# Patient Record
Sex: Female | Born: 1940 | Race: White | Hispanic: No | Marital: Married | Smoking: Former smoker
Health system: Southern US, Community
[De-identification: ages and names within clinical notes are randomized; demographics above are authoritative.]

## PROBLEM LIST (undated history)

## (undated) DIAGNOSIS — R159 Full incontinence of feces: Secondary | ICD-10-CM

## (undated) DIAGNOSIS — F32A Depression, unspecified: Secondary | ICD-10-CM

## (undated) DIAGNOSIS — E039 Hypothyroidism, unspecified: Secondary | ICD-10-CM

## (undated) DIAGNOSIS — M1990 Unspecified osteoarthritis, unspecified site: Secondary | ICD-10-CM

## (undated) DIAGNOSIS — M5416 Radiculopathy, lumbar region: Secondary | ICD-10-CM

## (undated) DIAGNOSIS — R269 Unspecified abnormalities of gait and mobility: Secondary | ICD-10-CM

## (undated) DIAGNOSIS — M792 Neuralgia and neuritis, unspecified: Secondary | ICD-10-CM

## (undated) DIAGNOSIS — Z860101 Personal history of adenomatous and serrated colon polyps: Secondary | ICD-10-CM

## (undated) DIAGNOSIS — N189 Chronic kidney disease, unspecified: Secondary | ICD-10-CM

## (undated) DIAGNOSIS — G43909 Migraine, unspecified, not intractable, without status migrainosus: Secondary | ICD-10-CM

## (undated) DIAGNOSIS — Z8719 Personal history of other diseases of the digestive system: Secondary | ICD-10-CM

## (undated) DIAGNOSIS — F419 Anxiety disorder, unspecified: Secondary | ICD-10-CM

## (undated) DIAGNOSIS — F329 Major depressive disorder, single episode, unspecified: Secondary | ICD-10-CM

## (undated) DIAGNOSIS — K219 Gastro-esophageal reflux disease without esophagitis: Secondary | ICD-10-CM

## (undated) DIAGNOSIS — K5790 Diverticulosis of intestine, part unspecified, without perforation or abscess without bleeding: Secondary | ICD-10-CM

## (undated) DIAGNOSIS — E7800 Pure hypercholesterolemia, unspecified: Secondary | ICD-10-CM

## (undated) DIAGNOSIS — E669 Obesity, unspecified: Secondary | ICD-10-CM

## (undated) DIAGNOSIS — Z87898 Personal history of other specified conditions: Secondary | ICD-10-CM

## (undated) DIAGNOSIS — M797 Fibromyalgia: Secondary | ICD-10-CM

## (undated) DIAGNOSIS — G3184 Mild cognitive impairment, so stated: Secondary | ICD-10-CM

## (undated) DIAGNOSIS — Z8669 Personal history of other diseases of the nervous system and sense organs: Secondary | ICD-10-CM

## (undated) DIAGNOSIS — I10 Essential (primary) hypertension: Secondary | ICD-10-CM

## (undated) DIAGNOSIS — M353 Polymyalgia rheumatica: Secondary | ICD-10-CM

## (undated) DIAGNOSIS — I700 Atherosclerosis of aorta: Secondary | ICD-10-CM

## (undated) HISTORY — PX: BREAST CYST ASPIRATION: SHX578

## (undated) HISTORY — PX: CHOLECYSTECTOMY: SHX55

## (undated) HISTORY — PX: JOINT REPLACEMENT: SHX530

## (undated) HISTORY — PX: COLON SURGERY: SHX602

## (undated) HISTORY — PX: HERNIA REPAIR: SHX51

## (undated) HISTORY — PX: THYROIDECTOMY: SHX17

## (undated) HISTORY — PX: ABDOMINAL HYSTERECTOMY: SHX81

---

## 2004-10-31 ENCOUNTER — Ambulatory Visit: Payer: Self-pay

## 2006-05-07 ENCOUNTER — Ambulatory Visit: Payer: Self-pay

## 2006-05-13 ENCOUNTER — Ambulatory Visit: Payer: Self-pay

## 2006-09-06 ENCOUNTER — Inpatient Hospital Stay: Payer: Self-pay | Admitting: Internal Medicine

## 2006-09-08 HISTORY — PX: COLONOSCOPY: SHX174

## 2008-12-19 ENCOUNTER — Inpatient Hospital Stay: Payer: Self-pay | Admitting: Internal Medicine

## 2009-01-24 ENCOUNTER — Ambulatory Visit: Payer: Self-pay | Admitting: Unknown Physician Specialty

## 2009-01-24 HISTORY — PX: ESOPHAGOGASTRODUODENOSCOPY: SHX1529

## 2009-06-13 ENCOUNTER — Inpatient Hospital Stay: Payer: Self-pay | Admitting: Internal Medicine

## 2010-02-06 ENCOUNTER — Inpatient Hospital Stay: Payer: Self-pay | Admitting: Surgery

## 2010-02-12 LAB — PATHOLOGY REPORT

## 2010-06-29 ENCOUNTER — Emergency Department: Payer: Self-pay | Admitting: Emergency Medicine

## 2010-07-05 ENCOUNTER — Inpatient Hospital Stay: Payer: Self-pay | Admitting: Surgery

## 2010-08-09 ENCOUNTER — Emergency Department: Payer: Self-pay | Admitting: Internal Medicine

## 2010-08-10 ENCOUNTER — Emergency Department: Payer: Self-pay | Admitting: Internal Medicine

## 2012-02-27 DIAGNOSIS — Z531 Procedure and treatment not carried out because of patient's decision for reasons of belief and group pressure: Secondary | ICD-10-CM | POA: Insufficient documentation

## 2012-04-15 DIAGNOSIS — M792 Neuralgia and neuritis, unspecified: Secondary | ICD-10-CM

## 2012-04-15 HISTORY — DX: Neuralgia and neuritis, unspecified: M79.2

## 2012-07-20 ENCOUNTER — Ambulatory Visit: Payer: Self-pay | Admitting: Unknown Physician Specialty

## 2012-08-12 ENCOUNTER — Emergency Department: Payer: Self-pay | Admitting: Emergency Medicine

## 2012-08-12 LAB — COMPREHENSIVE METABOLIC PANEL
Alkaline Phosphatase: 87 U/L (ref 50–136)
Anion Gap: 10 (ref 7–16)
BUN: 16 mg/dL (ref 7–18)
Bilirubin,Total: 1.5 mg/dL — ABNORMAL HIGH (ref 0.2–1.0)
Co2: 24 mmol/L (ref 21–32)
Creatinine: 0.89 mg/dL (ref 0.60–1.30)
Glucose: 109 mg/dL — ABNORMAL HIGH (ref 65–99)
Osmolality: 277 (ref 275–301)
Potassium: 3.3 mmol/L — ABNORMAL LOW (ref 3.5–5.1)
SGPT (ALT): 25 U/L (ref 12–78)

## 2012-08-12 LAB — CBC WITH DIFFERENTIAL/PLATELET
Basophil #: 0 10*3/uL (ref 0.0–0.1)
Eosinophil #: 0.1 10*3/uL (ref 0.0–0.7)
Eosinophil %: 1.5 %
Lymphocyte %: 11.2 %
MCH: 33.3 pg (ref 26.0–34.0)
MCHC: 35.8 g/dL (ref 32.0–36.0)
MCV: 93 fL (ref 80–100)
Monocyte #: 0.6 x10 3/mm (ref 0.2–0.9)
Monocyte %: 7.6 %
Neutrophil #: 6.7 10*3/uL — ABNORMAL HIGH (ref 1.4–6.5)
Neutrophil %: 79.4 %
Platelet: 179 10*3/uL (ref 150–440)
RDW: 12.9 % (ref 11.5–14.5)
WBC: 8.4 10*3/uL (ref 3.6–11.0)

## 2012-08-12 LAB — URINALYSIS, COMPLETE
Bilirubin,UR: NEGATIVE
Ketone: NEGATIVE
Leukocyte Esterase: NEGATIVE
Ph: 5 (ref 4.5–8.0)
RBC,UR: NONE SEEN /HPF (ref 0–5)
Squamous Epithelial: 2
WBC UR: <1 /HPF (ref 0–5)

## 2012-09-28 ENCOUNTER — Ambulatory Visit: Payer: Self-pay | Admitting: Unknown Physician Specialty

## 2012-09-30 LAB — PATHOLOGY REPORT

## 2013-01-25 DIAGNOSIS — M792 Neuralgia and neuritis, unspecified: Secondary | ICD-10-CM | POA: Insufficient documentation

## 2013-07-06 ENCOUNTER — Ambulatory Visit: Payer: Self-pay | Admitting: Family Medicine

## 2013-07-16 ENCOUNTER — Ambulatory Visit: Payer: Self-pay | Admitting: Orthopedic Surgery

## 2013-08-11 ENCOUNTER — Ambulatory Visit: Payer: Self-pay | Admitting: Orthopedic Surgery

## 2013-08-11 LAB — CBC
HCT: 42 % (ref 35.0–47.0)
HGB: 14.6 g/dL (ref 12.0–16.0)
MCH: 33.4 pg (ref 26.0–34.0)
MCHC: 34.8 g/dL (ref 32.0–36.0)
MCV: 96 fL (ref 80–100)
RBC: 4.38 10*6/uL (ref 3.80–5.20)
RDW: 12.9 % (ref 11.5–14.5)
WBC: 8.4 10*3/uL (ref 3.6–11.0)

## 2013-08-11 LAB — BASIC METABOLIC PANEL
Anion Gap: 9 (ref 7–16)
BUN: 14 mg/dL (ref 7–18)
CALCIUM: 8.8 mg/dL (ref 8.5–10.1)
CO2: 24 mmol/L (ref 21–32)
Chloride: 108 mmol/L — ABNORMAL HIGH (ref 98–107)
Creatinine: 0.79 mg/dL (ref 0.60–1.30)
EGFR (African American): >60
Glucose: 111 mg/dL — ABNORMAL HIGH (ref 65–99)
OSMOLALITY: 282 (ref 275–301)
Potassium: 3.7 mmol/L (ref 3.5–5.1)
Sodium: 141 mmol/L (ref 136–145)

## 2013-08-11 LAB — URINALYSIS, COMPLETE
BACTERIA: NONE SEEN
Bilirubin,UR: NEGATIVE
GLUCOSE, UR: NEGATIVE mg/dL (ref 0–75)
Ketone: NEGATIVE
Leukocyte Esterase: NEGATIVE
NITRITE: NEGATIVE
PH: 6 (ref 4.5–8.0)
Protein: NEGATIVE
SPECIFIC GRAVITY: 1.017 (ref 1.003–1.030)
WBC UR: <1 /HPF (ref 0–5)

## 2013-08-11 LAB — PROTIME-INR
INR: 1
Prothrombin Time: 13.2 secs (ref 11.5–14.7)

## 2013-08-11 LAB — MRSA PCR SCREENING

## 2013-08-11 LAB — APTT: Activated PTT: 31.9 secs (ref 23.6–35.9)

## 2013-08-25 DIAGNOSIS — F419 Anxiety disorder, unspecified: Secondary | ICD-10-CM | POA: Insufficient documentation

## 2013-10-12 ENCOUNTER — Ambulatory Visit: Payer: Self-pay | Admitting: Orthopedic Surgery

## 2013-10-12 LAB — CBC WITH DIFFERENTIAL/PLATELET
Basophil #: 0 10*3/uL (ref 0.0–0.1)
Basophil %: 0.6 %
Eosinophil #: 0.4 10*3/uL (ref 0.0–0.7)
Eosinophil %: 5.6 %
HCT: 40.8 % (ref 35.0–47.0)
HGB: 14.2 g/dL (ref 12.0–16.0)
Lymphocyte #: 1.7 10*3/uL (ref 1.0–3.6)
Lymphocyte %: 24.4 %
MCH: 33.2 pg (ref 26.0–34.0)
MCHC: 34.8 g/dL (ref 32.0–36.0)
MCV: 95 fL (ref 80–100)
Monocyte #: 0.5 x10 3/mm (ref 0.2–0.9)
Monocyte %: 7.2 %
Neutrophil #: 4.3 10*3/uL (ref 1.4–6.5)
Neutrophil %: 62.2 %
PLATELETS: 174 10*3/uL (ref 150–440)
RBC: 4.28 10*6/uL (ref 3.80–5.20)
RDW: 12.8 % (ref 11.5–14.5)
WBC: 7 10*3/uL (ref 3.6–11.0)

## 2013-10-12 LAB — BASIC METABOLIC PANEL
Anion Gap: 9 (ref 7–16)
BUN: 12 mg/dL (ref 7–18)
CREATININE: 0.71 mg/dL (ref 0.60–1.30)
Calcium, Total: 9 mg/dL (ref 8.5–10.1)
Chloride: 103 mmol/L (ref 98–107)
Co2: 30 mmol/L (ref 21–32)
EGFR (African American): >60
Glucose: 79 mg/dL (ref 65–99)
Osmolality: 282 (ref 275–301)
Potassium: 3.7 mmol/L (ref 3.5–5.1)
SODIUM: 142 mmol/L (ref 136–145)

## 2013-10-12 LAB — URINALYSIS, COMPLETE
Bacteria: NONE SEEN
Bilirubin,UR: NEGATIVE
Blood: NEGATIVE
Glucose,UR: NEGATIVE mg/dL (ref 0–75)
KETONE: NEGATIVE
Leukocyte Esterase: NEGATIVE
NITRITE: NEGATIVE
PROTEIN: NEGATIVE
Ph: 5 (ref 4.5–8.0)
RBC,UR: 1 /HPF (ref 0–5)
SPECIFIC GRAVITY: 1.025 (ref 1.003–1.030)
Squamous Epithelial: 6
WBC UR: 1 /HPF (ref 0–5)

## 2013-10-12 LAB — MRSA PCR SCREENING

## 2013-10-21 ENCOUNTER — Inpatient Hospital Stay: Payer: Self-pay | Admitting: Orthopedic Surgery

## 2013-10-21 LAB — PROTIME-INR
INR: 1
Prothrombin Time: 13.5 secs (ref 11.5–14.7)

## 2013-10-21 LAB — URINALYSIS, COMPLETE
Bacteria: NONE SEEN
Bilirubin,UR: NEGATIVE
Blood: NEGATIVE
Glucose,UR: NEGATIVE mg/dL (ref 0–75)
KETONE: NEGATIVE
Leukocyte Esterase: NEGATIVE
Nitrite: NEGATIVE
PH: 6 (ref 4.5–8.0)
Protein: NEGATIVE
SPECIFIC GRAVITY: 1.02 (ref 1.003–1.030)
Squamous Epithelial: 7
WBC UR: 1 /HPF (ref 0–5)

## 2013-10-21 LAB — APTT: Activated PTT: 34.8 secs (ref 23.6–35.9)

## 2013-10-22 LAB — BASIC METABOLIC PANEL
Anion Gap: 8 (ref 7–16)
BUN: 7 mg/dL (ref 7–18)
CALCIUM: 8.3 mg/dL — AB (ref 8.5–10.1)
CHLORIDE: 100 mmol/L (ref 98–107)
CO2: 26 mmol/L (ref 21–32)
CREATININE: 0.63 mg/dL (ref 0.60–1.30)
EGFR (African American): >60
EGFR (Non-African Amer.): >60
Glucose: 144 mg/dL — ABNORMAL HIGH (ref 65–99)
Osmolality: 269 (ref 275–301)
Potassium: 4 mmol/L (ref 3.5–5.1)
SODIUM: 134 mmol/L — AB (ref 136–145)

## 2013-10-22 LAB — CBC WITH DIFFERENTIAL/PLATELET
Basophil #: 0 10*3/uL (ref 0.0–0.1)
Basophil %: 0.3 %
EOS ABS: 0.1 10*3/uL (ref 0.0–0.7)
Eosinophil %: 0.9 %
HCT: 37.6 % (ref 35.0–47.0)
HGB: 13.3 g/dL (ref 12.0–16.0)
LYMPHS ABS: 1.1 10*3/uL (ref 1.0–3.6)
LYMPHS PCT: 10.5 %
MCH: 34.3 pg — AB (ref 26.0–34.0)
MCHC: 35.4 g/dL (ref 32.0–36.0)
MCV: 97 fL (ref 80–100)
Monocyte #: 1.1 x10 3/mm — ABNORMAL HIGH (ref 0.2–0.9)
Monocyte %: 10.8 %
Neutrophil #: 8 10*3/uL — ABNORMAL HIGH (ref 1.4–6.5)
Neutrophil %: 77.5 %
PLATELETS: 187 10*3/uL (ref 150–440)
RBC: 3.88 10*6/uL (ref 3.80–5.20)
RDW: 12.8 % (ref 11.5–14.5)
WBC: 10.4 10*3/uL (ref 3.6–11.0)

## 2013-10-23 LAB — HEMOGLOBIN: HGB: 11.7 g/dL — ABNORMAL LOW (ref 12.0–16.0)

## 2013-10-24 LAB — CBC WITH DIFFERENTIAL/PLATELET
Basophil #: 0.1 10*3/uL (ref 0.0–0.1)
Basophil %: 0.7 %
EOS ABS: 0.5 10*3/uL (ref 0.0–0.7)
EOS PCT: 5.4 %
HCT: 30.3 % — AB (ref 35.0–47.0)
HGB: 10.6 g/dL — ABNORMAL LOW (ref 12.0–16.0)
LYMPHS ABS: 1.5 10*3/uL (ref 1.0–3.6)
Lymphocyte %: 16.1 %
MCH: 33.5 pg (ref 26.0–34.0)
MCHC: 34.9 g/dL (ref 32.0–36.0)
MCV: 96 fL (ref 80–100)
MONO ABS: 0.8 x10 3/mm (ref 0.2–0.9)
MONOS PCT: 8.2 %
Neutrophil #: 6.4 10*3/uL (ref 1.4–6.5)
Neutrophil %: 69.6 %
Platelet: 163 10*3/uL (ref 150–440)
RBC: 3.16 10*6/uL — AB (ref 3.80–5.20)
RDW: 12.6 % (ref 11.5–14.5)
WBC: 9.2 10*3/uL (ref 3.6–11.0)

## 2013-10-24 LAB — BASIC METABOLIC PANEL
ANION GAP: 6 — AB (ref 7–16)
BUN: 8 mg/dL (ref 7–18)
CHLORIDE: 103 mmol/L (ref 98–107)
Calcium, Total: 8.3 mg/dL — ABNORMAL LOW (ref 8.5–10.1)
Co2: 30 mmol/L (ref 21–32)
Creatinine: 0.67 mg/dL (ref 0.60–1.30)
EGFR (African American): >60
Glucose: 101 mg/dL — ABNORMAL HIGH (ref 65–99)
OSMOLALITY: 276 (ref 275–301)
Potassium: 3.5 mmol/L (ref 3.5–5.1)
Sodium: 139 mmol/L (ref 136–145)

## 2013-10-26 ENCOUNTER — Encounter: Payer: Self-pay | Admitting: Internal Medicine

## 2014-01-08 ENCOUNTER — Emergency Department: Payer: Self-pay | Admitting: Emergency Medicine

## 2014-01-08 LAB — URINALYSIS, COMPLETE
BILIRUBIN, UR: NEGATIVE
Bacteria: NONE SEEN
Glucose,UR: NEGATIVE mg/dL (ref 0–75)
KETONE: NEGATIVE
NITRITE: NEGATIVE
PH: 6 (ref 4.5–8.0)
PROTEIN: NEGATIVE
RBC,UR: <1 /HPF (ref 0–5)
SPECIFIC GRAVITY: 1.002 (ref 1.003–1.030)
Squamous Epithelial: NONE SEEN
WBC UR: 39 /HPF (ref 0–5)

## 2014-01-10 LAB — URINE CULTURE

## 2014-07-12 ENCOUNTER — Ambulatory Visit: Payer: Self-pay | Admitting: Orthopedic Surgery

## 2014-07-20 ENCOUNTER — Ambulatory Visit
Admit: 2014-07-20 | Disposition: A | Discharge status: Home / Self Care | Payer: Self-pay | Attending: Family Medicine | Admitting: Family Medicine

## 2014-08-06 NOTE — H&P (Signed)
Subjective/Chief Complaint Right knee osteoarthritis   History of Present Illness 74 year old female with persistent right knee pain without relief from non-operative management.  Patient wishes to proceed with a total knee arthroplasty.  Her ability to ambulate and participate in ADLs is dramatically affected by her knee pain.  She has radiographic findings of osteoarthritis including joint space narrowing, subchondral sclerosis/cyst formation and osteophyte formation.   Past History diverticulitis, gallstones, sepsis, UTI, hypercholesterolemia, depression, GERD, migraines, hysterectomy, thyroidectomy   Past Med/Surgical Hx:  Urinary Track Infection:   diverticulitis:   sepsis:   gallstones:   Urinary Tract Infection:   Fibromyalgia:   reflux:   depression:   cholesterol:   migraines:   hysterectomy:   thyroidectomy:   ALLERGIES:  Demerol: Other  Milk: Headaches, GI Distress  Nuts: Headaches, GI Distress  HOME MEDICATIONS: Medication Instructions Status  Tylenol Extra Strength 500 mg oral tablet 2 tab(s) orally every 6 hours, As Needed - for Pain Active  simvastatin 20 mg oral tablet 1 tab(s) orally once a day (at bedtime) Active  gabapentin 300 mg oral capsule 1 cap(s) orally 3 times a day Active  omeprazole 20 mg oral delayed release tablet 1 tab(s) orally once a day (in the morning) Active  PARoxetine 40 mg oral tablet 1 tab(s) orally once a day (in the morning) Active  amoxicillin 500 mg oral capsule 1 cap(s) orally 3 times a day Active   Family and Social History:  Family History Non-Contributory   Place of Living Home   Review of Systems:  Subjective/Chief Complaint Right knee pain   Fever/Chills No   Cough No   Abdominal Pain No   Nausea/Vomiting No   SOB/DOE No   Chest Pain No   Dysuria No   Medications/Allergies Reviewed Medications/Allergies reviewed   Physical Exam:  GEN no acute distress   HEENT PERRL, hearing intact to voice, moist oral  mucosa, Oropharynx clear   NECK supple  No masses  trachea midline   RESP normal resp effort  clear BS  no use of accessory muscles   CARD regular rate  no murmur  no JVD   ABD denies tenderness  soft  normal BS  no Adominal Mass   LYMPH negative neck   EXTR negative cyanosis/clubbing, negative edema, Right knee skin is intact.  There is no erythema or ecchymosis.  ROM -5 to 120.  Pedal pulses are  palpable.  Sensation intact to light touch throughout the right lower extremity.  Motor function intact in right lower extremity.  No ligamentous laxity.   SKIN normal to palpation   NEURO motor/sensory function intact   PSYCH A+O to time, place, person   Lab Results: Routine Coag:  09-Jul-15 06:47   Prothrombin 13.5  INR 1.0 (INR reference interval applies to patients on anticoagulant therapy. A single INR therapeutic range for coumarins is not optimal for all indications; however, the suggested range for most indications is 2.0 - 3.0. Exceptions to the INR Reference Range may include: Prosthetic heart valves, acute myocardial infarction, prevention of myocardial infarction, and combinations of aspirin and anticoagulant. The need for a higher or lower target INR must be assessed individually. Reference: The Pharmacology and Management of the Vitamin K  antagonists: the seventh ACCP Conference on Antithrombotic and Thrombolytic Therapy. Chest.2004 Sept:126 (3suppl): L7870634. A HCT value >55% may artifactually increase the PT.  In one study,  the increase was an average of 25%. Reference:  "Effect on Routine and Special Coagulation Testing  Values of Citrate Anticoagulant Adjustment in Patients with High HCT Values." American Journal of Clinical Pathology 2006;126:400-405.)  Activated PTT (APTT) 34.8 (A HCT value >55% may artifactually increase the APTT. In one study, the increase was an average of 19%. Reference: "Effect on Routine and Special Coagulation Testing Values of  Citrate Anticoagulant Adjustment in Patients with High HCT Values." American Journal of Clinical Pathology 2006;126:400-405.)    Assessment/Admission Diagnosis Right knee osteoarthritis   Plan Patient wants to proceed with a right total knee arthroplasty.  I have reviewed the risks and benefits of surgery with the patient and her husband.  The risks include, but are not limited to: infection requiring removal of the prosthesis, bleeding, nerve and blood vessel injury (including the common peroneal nerve leading to foot drop), dislocation, fracture, leg length discrepancy, harware failure, persistent pain and/or instability, the need for more surgery including revision total knee surgery, DVT, and PE, MI, stroke, pneumonia, respiratory failure and death.  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  She also understands that we will use a tourniquet during the case and administer transexemic acid to help reduce her blood loss.  I have reviewed her pre-op labs.  The patient was found in pre-op to have an initial temperature of 100.3.  However subsequent measurements were WNL.  Patient is completely clinically asymptomatic.  I have discussed this finding also with the patient and her husband.  She wants to proceed with surgery and I see no clinical evidence of infection and agree with proceeding with the total knee arthroplasty.   Electronic Signatures: Juanell FairlyKrasinski, Philipp Callegari (MD)  (Signed 09-Jul-15 08:00)  Authored: CHIEF COMPLAINT and HISTORY, PAST MEDICAL/SURGIAL HISTORY, ALLERGIES, HOME MEDICATIONS, FAMILY AND SOCIAL HISTORY, REVIEW OF SYSTEMS, PHYSICAL EXAM, LABS, ASSESSMENT AND PLAN   Last Updated: 09-Jul-15 08:00 by Juanell FairlyKrasinski, Rodrick Payson (MD)

## 2014-08-06 NOTE — Discharge Summary (Signed)
PATIENT NAME:  Alice [name], Alice [name] MR#:  [id number] DATE OF BIRTH:  1940/07/19  DATE OF ADMISSION:  10/25/2013 DATE OF DISCHARGE:  10/25/2013  ADMISSION DIAGNOSIS:  Right total knee arthroplasty.  HISTORY OF PRESENT ILLNESS: [name] underwent a right uncomplicated total knee arthroplasty on 10/21/2013.  She was admitted to the orthopedic floor postoperatively. The patient was kept on 24 hours of postop antibiotics. On postop day #1, she was evaluated and had intact motor and sensory function in the right lower extremity. Her dressing was dry. She had a Polar Care sleeve over the right knee, along with TENS unit pads and a knee immobilizer to keep the knee in full extension while in bed. She had Hemovac drain removed on postop day #1.  The patient did have significant postop pain. She was changed from oxycodone and morphine to oral and IV Dilaudid. On postop day #2, the patient had her Foley catheter removed. Her hemoglobin was 11.7 and remained stable. She was requiring max assist with physical therapy to get to a chair. She was unable to initially tolerate CPM. Her husband remained at the bedside throughout her hospitalization. On postop day #3, her dressing was changed. She continued to have pain in the right knee, although this was improving. The patient was changed back to oxycodone and morphine as the Dilaudid was making her too sedated. She continued to have no chest pain, shortness of breath or abdominal pain. The patient continued to work with physical therapy and continued on incentive spirometry throughout her hospitalization. She was on Lovenox, TED stockings and AV1s throughout her hospitalization for DVT prophylaxis. On postop day #4, the patient was up out of bed to a chair. Her pain was improved. Her dressing remained clean, dry and intact. She was noted to have some right lower extremity edema. A duplex ultrasound was performed, which showed no evidence of DVT. The patient had had a bowel  movement. Given her clinical improvement, she was prepped for discharge to rehab.   DISCHARGE INSTRUCTIONS: The patient will remain partial weight-bearing on the right lower extremity for a total of 4 weeks postoperative. She will use a walker for assistance with ambulation. She can continue to use a TENS unit and the Polar Care to  help reduce swelling. She should elevate the leg whenever possible. She will continue working with physical therapy on right knee range of motion and gait training. She should use a CPM. It may increase her range from 0 to 90 degrees as her pain allows. She will remain on oxycodone for pain. She will follow up with me in 7 to 10 days for re-evaluation and x-ray. Staples will be removed in my office. The patient should have dry sterile dressing changes daily.    ____________________________ Kathreen DevoidKevin L. Zackariah Vanderpol, MD klk:dmm D: 10/25/2013 21:57:41 ET T: 10/25/2013 22:23:04 ET JOB#: [id number]  cc: Kathreen DevoidKevin L. Matson Welch, MD, <Dictator> Kathreen DevoidKEVIN L Brianny Soulliere MD ELECTRONICALLY SIGNED 10/26/2013 15:49

## 2014-08-06 NOTE — Op Note (Signed)
PATIENT NAME:  Alice [name], Alice [name] MR#:  [id number] DATE OF BIRTH:  1940-09-05  DATE OF PROCEDURE:  10/21/2013  PREOPERATIVE DIAGNOSIS: Right tricompartmental osteoarthritis.  POSTOPERATIVE DIAGNOSIS:  Right tricompartmental osteoarthritis.   PROCEDURE: Right total knee arthroplasty.   SURGEON: Juanell Fairly, M.D.   ASSISTANT: Deeann Saint, M.D. and Horris Latino, [place] [employer] student.   ANESTHESIA: Spinal.   ESTIMATED BLOOD LOSS: 25 mL.   COMPLICATIONS: None.   TOURNIQUET TIME: 125 minutes.   IMPLANTS: DePuy PFC Sigma posterior stabilized total knee size 2.5 with a size 2 rotating platform tibial plateau and a 15 mm size 2.5 polyethylene tibial spacer component and a 35 mm 3 pegged patella dome component.   INDICATIONS FOR PROCEDURE: Ms. Alice [name] is a 74 year old female with severe joint bone on bone joint space narrowing of the medial compartment with subchondral sclerosis, cyst formation, as well as marginal osteophytes. She has had over a year of significant knee pain, which is affecting her ability to ambulate and perform activities of daily living.  I have reviewed the risks and benefits of surgery with the patient in my office prior to the date of surgery. She understands the risks include infection requiring removal of prosthesis, bleeding. The patient is a TEFL teacher Witness and does not want any blood transfusions, nerve or blood vessel injury, fracture, dislocation, hardware failure and the need for further surgery. Other risks include knee stiffness and arthrofibrosis, deep vein thrombosis and pulmonary embolism, myocardial infarction, stroke, pneumonia, respiratory failure, and death. The patient understood these risks and wished to proceed.     PROCEDURE NOTE: I had marked the patient's right knee with the word "yes" according to the hospital's right site protocol after verbally confirming that this was the correct site of surgery. The patient was then brought to the Operating Room  where she was placed supine on the operative table. She underwent a spinal anesthetic by the anesthesia service. All bony prominences were adequately padded. The patient was then prepped and draped in sterile fashion. A timeout was performed to verify the patient's name, date of birth, medical record number, correct site of surgery, and the procedure to be performed. It was also used to verify the patient had received antibiotics, all appropriate instruments, implants and radiographic studies were available in the room. Once all in attendance were in agreement, the case began.   The patient's right leg was exsanguinated with an Esmarch. The tourniquet was inflated to 275 mmHg.  It was inflated for a total 125 minutes.   The patient had the proposed incision drawn out with a surgical marker. The midline incision was then made with a #10 blade. Full thickness skin flaps were created. The patient then had a medial arthrotomy performed. The patella was everted and the knee was flexed to approximately 90 degrees.  Preoperatively, the patient had range of motion from -5 to approximately 95 degrees of flexion. She did not have any instability. Examination under anesthesia was performed prior to the incision being made.   The fat pad was excised along with the medial and lateral meniscus and the anterior cruciate ligament. A medial soft tissue sleeve was also created. A rongeur was used to create an entry point for the intramedullary drill guide, which was advanced into the femoral medullary canal. This allowed for insertion of the intramedullary distal femoral cutting block to be positioned. The patient had the femoral cutting block pinned with 10 mm resection. The intramedullary guide was then removed. An oscillating saw was used  to create the distal femoral cut. The attention was then turned to the tibia.   An external tibial alignment guide was then applied to the anterior portion of the patient's right lower  leg. This was positioned in line with the center of the ankle and the second ray of the foot. This was then pinned into position with 2 parallel pins and a cross pin. The cutting block had been positioned to take a minimum cut off the medial compartment given that there was already some bone loss from her arthritis. An oscillating saw was then used to perform the proximal tibial osteotomy perpendicular to the mechanical axis of the tibia. The patient did have a tibial bow, which had to be accounted for as well. The external tibial alignment guide had also been matched to the anterior the cortex of the tibia. The tibial alignment guide was then removed. A drop down guide was used to confirm that the cut been made perpendicular to the mechanical axis. The attention was then turned back to the femur.   A posterior referencing tibial sizing guide was then applied to the distal femur and pinned into position with 3 degrees of external rotation. This was confirmed using Whiteside's line. It was also confirmed using the intercondylar axis. The femur was measured to be a size 2.5.   A 2.5 distal femoral cutting block was then malleted in position. The anterior, posterior and chamfer cuts were then made. The cutting block was then removed. The box cutting guide was then applied to the distal femur, pinned into position, and cut with an oscillating saw.   The attention was then turned back to the tibial plateau. Tibial plateau was covered best with a  size 2 tibial base plate. This was pinned into position. A reamer was used to create the entry point into the proximal tibia for the keel along with a keel punch. The pins were then removed. A size 2.5 x 10 mm tibial polyethylene trial insert was then positioned along with the 2.5 femoral component. Once all trial components were in position, the knee was taken through a full range of motion and found to have excellent stability, as well as and excellent flexion and  extension. He the knee was left in full extension .  The attention was turned to the patella preparation.   The patella was measured to be approximately 18 mm in depth. Approximately 6 mm was resected with an oscillating saw using a patellar cutting guide. The patella was covered well with the 35 mm trial. The 35 mm patella peg guide was placed and 3 pegs holes were drilled. The patella trial was then placed.  The knee, again, was taken through a full range of motion and the patient had excellent patellar tracking. All trial components were removed. The wound was copiously irrigated with pulse lavage with impregnated with GU. All bony surfaces were then dried. Methylmethacrylate was then positioned over the tibial surface and the actual size 2 DePuy keeled rotating platform was then malleted into position gently and that all excess methylmethacrylate removed. The methylmethacrylate was then placed over the femoral exposed surface and a 2.5 Sigma PFC component was then malleted into position. A size 10 mm 2.5 tibial trial insert was then placed into position and the leg was brought into full extension. Again, all the excess methylmethacrylate was removed. The patellar surface then had methylmethacrylate applied and the actual 35 mm 3-pegged domed patella component was placed and a patellar clamp used  to allow it to be compressed into position. Again, excess methylmethacrylate was removed. The methylmethacrylate was allowed to cure over a span of approximately 16 minutes. The knee was then taken through a full range of motion. The 12.5 and 15 mm 2.5 tibial rotating platform inserts were also trialed. The patient was found to have the best stability and flexion, extension, and mid flexion with the size 15 mm inserted. Therefore, a 2.5 x 15 mm actual rotating platform polyethylene tibial insert was then inserted. Just prior to its insertion of the patient was injected with EXPAREL, including in the posterior capsule  and then the medial and lateral capsules as well. Once the actual tibial polyethylene insert was in position, the wound was copiously irrigated again. An Autovac drain was placed and 2 limbs of tubing were removed out of the superolateral aspect of the knee. The patient had the medial arthrotomy closed with #1 Ethibond. These wound again was copiously irrigated with pulse lavage. The subcutaneous tissue was closed with 0 and 2-0 Vicryl and the skin approximated with staples. A dry sterile dressing was applied along with a Polar Care sleeve, TENS unit leads, and a knee immobilizer. The patient was then transferred to the hospital bed and brought to the PACU in stable condition.  I was scrubbed and present for the entire case and all sharp and instrument counts were correct at the conclusion of the case. I spoke with her husband in the postoperative consultation room to let him know the case had gone without complication. The patient was stable in the recovery room.    ____________________________ Kathreen DevoidKevin L. Jaquavion Mccannon, MD klk:ts D: 10/23/2013 17:36:06 ET T: 10/23/2013 18:41:22 ET JOB#: [id number]  cc: Kathreen DevoidKevin L. Deborrah Mabin, MD, <Dictator> Kathreen DevoidKEVIN L Yeilin Zweber MD ELECTRONICALLY SIGNED 10/28/2013 17:39

## 2014-08-06 NOTE — Discharge Summary (Signed)
PATIENT NAME:  Alice [name], Alice [name] MR#:  [id number] DATE OF BIRTH:  [birth year]  DATE OF ADMISSION:  10/21/2013 DATE OF DISCHARGE:  10/25/2013  ADMISSION DIAGNOSIS:  Right total knee arthroplasty.  HISTORY OF PRESENT ILLNESS: [name] underwent a right uncomplicated total knee arthroplasty on 10/21/2013.  She was admitted to the orthopedic floor postoperatively. The patient was kept on 24 hours of postop antibiotics. On postop day #1, she was evaluated and had intact motor and sensory function in the right lower extremity. Her dressing was dry. She had a Polar Care sleeve over the right knee, along with TENS unit pads and a knee immobilizer to keep the knee in full extension while in bed. She had Hemovac drain removed on postop day #1.  The patient did have significant postop pain. She was changed from oxycodone and morphine to oral and IV Dilaudid. On postop day #2, the patient had her Foley catheter removed. Her hemoglobin was 11.7 and remained stable. She was requiring max assist with physical therapy to get to a chair. She was unable to initially tolerate CPM. Her husband remained at the bedside throughout her hospitalization. On postop day #3, her dressing was changed. She continued to have pain in the right knee, although this was improving. The patient was changed back to oxycodone and morphine as the Dilaudid was making her too sedated. She continued to have no chest pain, shortness of breath or abdominal pain. The patient continued to work with physical therapy and continued on incentive spirometry throughout her hospitalization. She was on Lovenox, TED stockings and AV1s throughout her hospitalization for DVT prophylaxis. On postop day #4, the patient was up out of bed to a chair. Her pain was improved. Her dressing remained clean, dry and intact. She was noted to have some right lower extremity edema. A duplex ultrasound was performed, which showed no evidence of DVT. The patient had had a bowel  movement. Given her clinical improvement, she was prepped for discharge to rehab.   DISCHARGE INSTRUCTIONS: The patient will remain partial weight-bearing on the right lower extremity for a total of 4 weeks postoperative. She will use a walker for assistance with ambulation. She can continue to use a TENS unit and the Polar Care to  help reduce swelling. She should elevate the leg whenever possible. She will continue working with physical therapy on right knee range of motion and gait training. She should use a CPM. It may increase her range from 0 to 90 degrees as her pain allows. She will remain on oxycodone for pain. She will follow up with me in 7 to 10 days for re-evaluation and x-ray. Staples will be removed in my office. The patient should have dry sterile dressing changes daily.    ____________________________ Kathreen DevoidKevin L. Kenslie Abbruzzese, MD klk:dmm D: 10/25/2013 21:57:41 ET T: 10/25/2013 22:23:04 ET JOB#: [id number]  cc: Kathreen DevoidKevin L. Madelyne Millikan, MD, <Dictator> Kathreen DevoidKEVIN L Chidera Thivierge MD ELECTRONICALLY SIGNED 10/26/2013 15:49

## 2014-09-08 ENCOUNTER — Other Ambulatory Visit: Payer: Self-pay | Admitting: Family Medicine

## 2014-09-08 DIAGNOSIS — Z1239 Encounter for other screening for malignant neoplasm of breast: Secondary | ICD-10-CM

## 2014-09-08 DIAGNOSIS — R928 Other abnormal and inconclusive findings on diagnostic imaging of breast: Secondary | ICD-10-CM

## 2014-09-23 ENCOUNTER — Ambulatory Visit
Admission: RE | Admit: 2014-09-23 | Discharge: 2014-09-23 | Disposition: A | Discharge status: Home / Self Care | Payer: Medicare Other | Source: Ambulatory Visit | Attending: Family Medicine | Admitting: Family Medicine

## 2014-09-23 ENCOUNTER — Ambulatory Visit: Payer: Self-pay

## 2014-09-23 DIAGNOSIS — Z1239 Encounter for other screening for malignant neoplasm of breast: Secondary | ICD-10-CM

## 2014-09-23 DIAGNOSIS — N6489 Other specified disorders of breast: Secondary | ICD-10-CM | POA: Diagnosis not present

## 2014-09-23 DIAGNOSIS — R928 Other abnormal and inconclusive findings on diagnostic imaging of breast: Secondary | ICD-10-CM

## 2014-12-06 ENCOUNTER — Other Ambulatory Visit: Payer: Self-pay | Admitting: Family Medicine

## 2014-12-06 DIAGNOSIS — R928 Other abnormal and inconclusive findings on diagnostic imaging of breast: Secondary | ICD-10-CM

## 2014-12-26 ENCOUNTER — Ambulatory Visit: Payer: Medicare Other

## 2014-12-26 ENCOUNTER — Other Ambulatory Visit: Payer: Medicare Other

## 2014-12-30 ENCOUNTER — Other Ambulatory Visit: Payer: Self-pay | Admitting: Family Medicine

## 2014-12-30 ENCOUNTER — Ambulatory Visit
Admission: RE | Admit: 2014-12-30 | Discharge: 2014-12-30 | Disposition: A | Discharge status: Home / Self Care | Payer: Medicare Other | Source: Ambulatory Visit | Attending: Family Medicine | Admitting: Family Medicine

## 2014-12-30 DIAGNOSIS — R928 Other abnormal and inconclusive findings on diagnostic imaging of breast: Secondary | ICD-10-CM

## 2015-10-12 ENCOUNTER — Emergency Department
Admission: EM | Admit: 2015-10-12 | Discharge: 2015-10-13 | Disposition: A | Discharge status: Home / Self Care | Payer: Medicare Other | Attending: Emergency Medicine | Admitting: Emergency Medicine

## 2015-10-12 ENCOUNTER — Emergency Department: Payer: Medicare Other

## 2015-10-12 ENCOUNTER — Encounter: Payer: Self-pay | Admitting: Occupational Medicine

## 2015-10-12 DIAGNOSIS — K5730 Diverticulosis of large intestine without perforation or abscess without bleeding: Secondary | ICD-10-CM | POA: Diagnosis not present

## 2015-10-12 DIAGNOSIS — Z87891 Personal history of nicotine dependence: Secondary | ICD-10-CM | POA: Insufficient documentation

## 2015-10-12 DIAGNOSIS — M1990 Unspecified osteoarthritis, unspecified site: Secondary | ICD-10-CM | POA: Diagnosis not present

## 2015-10-12 DIAGNOSIS — F329 Major depressive disorder, single episode, unspecified: Secondary | ICD-10-CM | POA: Diagnosis not present

## 2015-10-12 DIAGNOSIS — R0789 Other chest pain: Secondary | ICD-10-CM | POA: Diagnosis not present

## 2015-10-12 DIAGNOSIS — I10 Essential (primary) hypertension: Secondary | ICD-10-CM | POA: Insufficient documentation

## 2015-10-12 DIAGNOSIS — R51 Headache: Secondary | ICD-10-CM | POA: Diagnosis present

## 2015-10-12 HISTORY — DX: Essential (primary) hypertension: I10

## 2015-10-12 HISTORY — DX: Migraine, unspecified, not intractable, without status migrainosus: G43.909

## 2015-10-12 HISTORY — DX: Gastro-esophageal reflux disease without esophagitis: K21.9

## 2015-10-12 HISTORY — DX: Polymyalgia rheumatica: M35.3

## 2015-10-12 HISTORY — DX: Personal history of other diseases of the digestive system: Z87.19

## 2015-10-12 HISTORY — DX: Fibromyalgia: M79.7

## 2015-10-12 HISTORY — DX: Personal history of other specified conditions: Z87.898

## 2015-10-12 HISTORY — DX: Depression, unspecified: F32.A

## 2015-10-12 HISTORY — DX: Unspecified osteoarthritis, unspecified site: M19.90

## 2015-10-12 HISTORY — DX: Neuralgia and neuritis, unspecified: M79.2

## 2015-10-12 HISTORY — DX: Obesity, unspecified: E66.9

## 2015-10-12 HISTORY — DX: Anxiety disorder, unspecified: F41.9

## 2015-10-12 HISTORY — DX: Personal history of other diseases of the nervous system and sense organs: Z86.69

## 2015-10-12 HISTORY — DX: Pure hypercholesterolemia, unspecified: E78.00

## 2015-10-12 HISTORY — DX: Major depressive disorder, single episode, unspecified: F32.9

## 2015-10-12 NOTE — ED Notes (Signed)
PT PRESENTS VIA EMS FROM HOME PT GOT INTO A COUGHING SPELL STARTED HAVING CHEST PAIN CENTRAL RADIATION TO NECK BACK AND BOTH ARMS. PT STATES HAS HAPPEN BEFORE UNKNOWN CAUSE NORMALLY GOES AWAY. PT ALSO CO HEADACHE. DENIES SOB NAUSEA. STATES SOME DIZZINESS. PT HYPERTENSIVE.

## 2015-10-12 NOTE — ED Provider Notes (Signed)
[facility] Emergency Department Provider Note  ____________________________________________  Time seen: Approximately 11:53 PM  I have reviewed the triage vital signs and the nursing notes.   HISTORY  Chief Complaint Chest Pain and Headache    HPI Alice [name] is a 75 y.o. female with a past medical history that includes hypertension but no antihypertensive medications, polymyalgia rheumatica, fibromyalgia, anxiety, depression, and several other chronic conditions who presents for evaluation of acute onset chest wall pain after choking on some cereal.  She reports that she was eating cereal tonight when she became choked and started coughing violently.  During and after the coughing she has had sharp anterior chest pain that is radiating to her back and her neck and down both arms.  She states she has had similar spells in the past but the pain does not usually continue.  She describes the pain as severe and she is moaning in the bed.  Nothing is making it better and movement makes it worse.  She is in no acute respiratory distress and is not having any trouble breathing.  She denies recent fever/chills, abdominal pain, dysuria, nausea/vomiting.   Past Medical History  Diagnosis Date  . Arthritis   . GERD (gastroesophageal reflux disease)   . Hypercholesteremia   . Hypertension   . Polymyalgia rheumatica (HCC)   . History of diverticulitis   . Neuralgia 2014  . Fibromyalgia   . Anxiety   . History of carpal tunnel syndrome   . Depression   . Migraine   . Obesity   . History of vertigo     There are no active problems to display for this patient.   Past Surgical History  Procedure Laterality Date  . Breast cyst aspiration Right +/- 10 yrs ago    x 2  . Abdominal hysterectomy    . Hernia repair      Current Outpatient Rx  Name  Route  Sig  Dispense  Refill  . etodolac (LODINE) 400 MG tablet   Oral   Take 400 mg by mouth 2 (two) times  daily.      2   . gabapentin (NEURONTIN) 300 MG capsule   Oral   Take 300 mg by mouth 3 (three) times daily.      9   . PARoxetine (PAXIL) 40 MG tablet   Oral   Take 40 mg by mouth daily.      99   . ranitidine (ZANTAC) 150 MG capsule   Oral   Take 150 mg by mouth 2 (two) times daily.      11     Allergies Demerol  History reviewed. No pertinent family history.  Social History Social History  Substance Use Topics  . Smoking status: Former Games developermoker  . Smokeless tobacco: None  . Alcohol Use: No    Review of Systems Constitutional: No fever/chills Eyes: No visual changes. ENT: No sore throat. Cardiovascular: +chest pain radiating to back and arms Respiratory: Denies shortness of breath. Gastrointestinal: No abdominal pain.  No nausea, no vomiting.  No diarrhea.  No constipation. Genitourinary: Negative for dysuria. Musculoskeletal: Negative for back pain. Skin: Negative for rash. Neurological: Negative for headaches, focal weakness or numbness.  10-point ROS otherwise negative.  ____________________________________________   PHYSICAL EXAM:  VITAL SIGNS: ED Triage Vitals  Enc Vitals Group     BP 10/12/15 2328 202/117 mmHg     Pulse Rate 10/12/15 2328 84     Resp 10/12/15 2328 14  Temp 10/12/15 2328 97.5 F (36.4 C)     Temp Source 10/12/15 2328 Oral     SpO2 10/12/15 2323 97 %     Weight 10/12/15 2328 200 lb (90.719 kg)     Height 10/12/15 2328 5\' 1"  (1.549 m)     Head Cir --      Peak Flow --      Pain Score 10/12/15 2328 9     Pain Loc --      Pain Edu? --      Excl. in GC? --     Constitutional: Alert and oriented. Moaning in discomfort but breathing comfortably, no respiratory distress Eyes: Conjunctivae are normal. PERRL. EOMI. Head: Atraumatic. Nose: No congestion/rhinnorhea. Mouth/Throat: Mucous membranes are moist.  Oropharynx non-erythematous. Neck: No stridor.  No meningeal signs.   Cardiovascular: Normal rate, regular rhythm.  Good peripheral circulation. Grossly normal heart sounds.   Respiratory: Normal respiratory effort.  No retractions. Lungs CTAB. Gastrointestinal: Soft and nontender. No distention.  Musculoskeletal: No lower extremity tenderness nor edema. No gross deformities of extremities. Neurologic:  Normal speech and language. No gross focal neurologic deficits are appreciated.  Skin:  Skin is warm, dry and intact. No rash noted. Psychiatric: Mood and affect are normal. Speech and behavior are normal.  ____________________________________________   LABS (all labs ordered are listed, but only abnormal results are displayed)  Labs Reviewed  COMPREHENSIVE METABOLIC PANEL - Abnormal; Notable for the following:    Glucose, Bld 138 (*)    Total Bilirubin 1.9 (*)    All other components within normal limits  URINALYSIS COMPLETEWITH MICROSCOPIC ([facility] ONLY) - Abnormal; Notable for the following:    Color, Urine YELLOW (*)    APPearance HAZY (*)    Glucose, UA 50 (*)    Protein, ur 100 (*)    Leukocytes, UA TRACE (*)    Bacteria, UA MANY (*)    Squamous Epithelial / LPF 0-5 (*)    All other components within normal limits  CBC - Abnormal; Notable for the following:    WBC 12.8 (*)    All other components within normal limits  URINE CULTURE  TROPONIN I  LIPASE, BLOOD   ____________________________________________  EKG  ED ECG REPORT I, Jerardo Costabile, the attending physician, personally viewed and interpreted this ECG.  Date: 10/12/2015 EKG Time: 23:24 Rate: 85 Rhythm: normal sinus rhythm QRS Axis: Borderline right axis deviation Intervals: normal ST/T Wave abnormalities: normal Conduction Disturbances: none Narrative Interpretation: Non-specific ST segment / T-wave changes, but no evidence of acute ischemia.  ____________________________________________  RADIOLOGY   Dg Chest 2 View  10/13/2015  CLINICAL DATA:  74 year old female with cough and central chest pain EXAM: CHEST  2  VIEW COMPARISON:  Chest radiograph dated 08/11/2013 FINDINGS: Two views of the chest demonstrate minimal left lung base atelectasis/scarring. No focal consolidation, pleural effusion, or pneumothorax. The cardiac silhouette is within normal limits. No acute osseous pathology. IMPRESSION: No active cardiopulmonary disease. Electronically Signed   By: Elgie Collard M.D.   On: 10/13/2015 00:10   Ct Angio Chest/abd/pel For Dissection W And/or W/wo  10/13/2015  CLINICAL DATA:  75 year old female with chest pain radiating to the neck and back EXAM: CT ANGIOGRAPHY CHEST, ABDOMEN AND PELVIS TECHNIQUE: Multidetector CT imaging through the chest, abdomen and pelvis was performed using the standard protocol during bolus administration of intravenous contrast. Multiplanar reconstructed images and MIPs were obtained and reviewed to evaluate the vascular anatomy. CONTRAST:  125 cc Isovue 370 COMPARISON:  Abdominal CT dated 07/20/2012 FINDINGS: CTA CHEST FINDINGS Bibasilar linear atelectasis/ scarring. The lungs are otherwise clear. A 7 mm subpleural nodular density in the right lower lobe (series 8, image 72) appears similar to the prior CT. Follow-up recommended. There is no pleural effusion or pneumothorax. The central airways are patent. There is mild atherosclerotic calcification of the aortic arch. The origins of the great vessels of the aortic arch appear patent. There is no CT evidence of pulmonary embolism. No cardiomegaly or pericardial effusion. There is no hilar or mediastinal adenopathy. The esophagus is grossly unremarkable. The left thyroid gland is not identified and may be surgically absent. Multiple hypodense nodules noted in the right thyroid gland. Ultrasound is recommended for further evaluation. There is no axillary adenopathy. The chest wall soft tissues appear unremarkable. There is degenerative changes of the spine. No acute fracture. Review of the MIP images confirms the above findings. CTA ABDOMEN  AND PELVIS FINDINGS No intra-abdominal free air or free fluid. Cholecystectomy. The liver, spleen, and the adrenal glands appear unremarkable there is mild inflammatory changes of the pancreas compatible with pancreatitis. Correlation with pancreatic enzymes recommended. There is no drainable fluid collection/abscess or pseudocyst. Multiple bilateral renal hypodense lesions measuring up to 4 cm in the interpolar aspect of the right kidney appears stable compared to the prior study. The larger lesions represent cysts and the smaller ones are not well characterized on this CT. There is no hydronephrosis on either side. The visualized ureters and urinary bladder appear unremarkable. Hysterectomy. The ovaries are grossly unremarkable. There is extensive colonic diverticulosis. No active inflammatory changes. The stomach is distended with gastric content. This thickened appearance of the gastric wall without definite inflammatory changes. Clinical correlation is recommended to evaluate for gastritis. There is a 3 cm duodenal diverticulum at the head of the pancreas. There is no evidence of bowel obstruction. Normal appendix. There is mild aortoiliac atherosclerotic disease. The origins of the celiac axis, SMA, IMA as well as the origins of the renal arteries are patent. There is an accessory right hepatic artery arising from the SMA. The IVC appears grossly unremarkable. No portal venous gas identified. There is no adenopathy. There is midline vertical anterior abdominal wall incisional scar. The abdominal wall soft tissues are otherwise unremarkable. There is osteopenia with degenerative changes of the spine. No acute fracture. Review of the MIP images confirms the above findings. IMPRESSION: No CT evidence of pulmonary embolism or aortic dissection. Pancreatitis.  No abscess. Extensive colonic diverticulosis. No active inflammation or bowel obstruction. Normal appendix. Thickened appearance of the gastric wall.  Clinical correlation is recommended to evaluate for gastritis. Electronically Signed   By: Elgie Collard M.D.   On: 10/13/2015 02:43    ____________________________________________   PROCEDURES  Procedure(s) performed:   Procedures   ____________________________________________   INITIAL IMPRESSION / ASSESSMENT AND PLAN / ED COURSE  Pertinent labs & imaging results that were available during my care of the patient were reviewed by me and considered in my medical decision making (see chart for details).  The patient has severe hypertension and I verified both with her bag of medications and in the computer that she takes no antihypertensive medications.  Her blood pressure may be elevated tonight because of her acute distress.  I will treat with morphine 4 mg IV for sensitive for blood pressure comes down.  If we need to intervene on the blood pressure we can after first control her discomfort.  She has no crepitus or subcutaneous emphysema,  no difficulty breathing, clear lung sounds throughout, nothing to suggest an esophageal rupture or severe emergent medical complication of her choking episode.  I will see what her labs show and see if she feels more comfortable after the morphine to see if we need to proceed with additional workup.  ----------------------------------------- 1:27 AM on 10/13/2015 -----------------------------------------  Workup is unremarkable except for a slight leukocytosis.  However given the patient's complaint of persistent severe pain and her persistent hypertension, although the pain is not controlled, I will proceed with CT angiography to evaluate for possible aortic dissection given the description of severe chest pain and upper abdominal pain radiating through to her back with pain in bilateral arms.   ----------------------------------------- 4:48 AM on 10/13/2015 -----------------------------------------  The patient has been stable.  After her  third round of narcotics she required oxygen for a short amount of time but she is now awake and alert and conversing with no difficulty.  I explained that the results of her scans were very reassuring.  I reassessed her and she has absolutely no tenderness to palpation of her abdomen and has had no nausea nor vomiting.  There is nothing clinically to suggest pancreatitis and she has a normal lipase.  I will mention in the discharge paperwork that she should follow-up with her regular doctor regarding the inflammation but that there is no clinical evidence of pancreatitis.  She is comfortable at this time and I encouraged her to take her regular medications and follow-up in the morning with a call to her PCP.  She and her husband understand and agree with the plan.  ____________________________________________  FINAL CLINICAL IMPRESSION(S) / ED DIAGNOSES  Final diagnoses:  Chest wall pain     MEDICATIONS GIVEN DURING THIS VISIT:  Medications  morphine 4 MG/ML injection 4 mg (4 mg Intravenous Given 10/13/15 0038)  ondansetron (ZOFRAN) injection 4 mg (4 mg Intravenous Given 10/13/15 0044)  morphine 4 MG/ML injection 4 mg (4 mg Intravenous Given 10/13/15 0119)  sodium chloride 0.9 % bolus 500 mL (0 mLs Intravenous Stopped 10/13/15 0154)  iopamidol (ISOVUE-370) 76 % injection 125 mL (125 mLs Intravenous Contrast Given 10/13/15 0203)  HYDROmorphone (DILAUDID) injection 1 mg (1 mg Intravenous Given 10/13/15 0228)     NEW OUTPATIENT MEDICATIONS STARTED DURING THIS VISIT:  New Prescriptions   No medications on file      Note:  This document was prepared using Dragon voice recognition software and may include unintentional dictation errors.   Loleta Roseory Shammara Jarrett, MD 10/13/15 [phone]

## 2015-10-13 ENCOUNTER — Emergency Department: Payer: Medicare Other

## 2015-10-13 ENCOUNTER — Encounter: Payer: Self-pay | Admitting: Emergency Medicine

## 2015-10-13 DIAGNOSIS — R0789 Other chest pain: Secondary | ICD-10-CM | POA: Diagnosis not present

## 2015-10-13 LAB — COMPREHENSIVE METABOLIC PANEL
ALT: 20 U/L (ref 14–54)
AST: 35 U/L (ref 15–41)
Albumin: 4.7 g/dL (ref 3.5–5.0)
Alkaline Phosphatase: 63 U/L (ref 38–126)
Anion gap: 14 (ref 5–15)
BILIRUBIN TOTAL: 1.9 mg/dL — AB (ref 0.3–1.2)
BUN: 16 mg/dL (ref 6–20)
CHLORIDE: 103 mmol/L (ref 101–111)
CO2: 23 mmol/L (ref 22–32)
CREATININE: 0.85 mg/dL (ref 0.44–1.00)
Calcium: 9.5 mg/dL (ref 8.9–10.3)
Glucose, Bld: 138 mg/dL — ABNORMAL HIGH (ref 65–99)
POTASSIUM: 3.6 mmol/L (ref 3.5–5.1)
Sodium: 140 mmol/L (ref 135–145)
TOTAL PROTEIN: 7.6 g/dL (ref 6.5–8.1)

## 2015-10-13 LAB — URINALYSIS COMPLETE WITH MICROSCOPIC (ARMC ONLY)
BILIRUBIN URINE: NEGATIVE
GLUCOSE, UA: 50 mg/dL — AB
Hgb urine dipstick: NEGATIVE
KETONES UR: NEGATIVE mg/dL
Nitrite: NEGATIVE
PH: 8 (ref 5.0–8.0)
Protein, ur: 100 mg/dL — AB
Specific Gravity, Urine: 1.009 (ref 1.005–1.030)

## 2015-10-13 LAB — CBC
HCT: 44.7 % (ref 35.0–47.0)
HEMOGLOBIN: 15.9 g/dL (ref 12.0–16.0)
MCH: 32.5 pg (ref 26.0–34.0)
MCHC: 35.5 g/dL (ref 32.0–36.0)
MCV: 91.6 fL (ref 80.0–100.0)
PLATELETS: 202 10*3/uL (ref 150–440)
RBC: 4.88 MIL/uL (ref 3.80–5.20)
RDW: 13.1 % (ref 11.5–14.5)
WBC: 12.8 10*3/uL — AB (ref 3.6–11.0)

## 2015-10-13 LAB — LIPASE, BLOOD: LIPASE: 38 U/L (ref 11–51)

## 2015-10-13 LAB — TROPONIN I

## 2015-10-13 MED ORDER — ONDANSETRON HCL 4 MG/2ML IJ SOLN
INTRAMUSCULAR | Status: AC
  Administered 2015-10-13: 4 mg via INTRAVENOUS
  Filled 2015-10-13: qty 2

## 2015-10-13 MED ORDER — MORPHINE SULFATE (PF) 4 MG/ML IV SOLN
4.0000 mg | Freq: Once | INTRAVENOUS | Status: AC
Start: 2015-10-13 — End: 2015-10-13
  Administered 2015-10-13: 4 mg via INTRAVENOUS
  Filled 2015-10-13: qty 1

## 2015-10-13 MED ORDER — SODIUM CHLORIDE 0.9 % IV BOLUS (SEPSIS)
500.0000 mL | INTRAVENOUS | Status: AC
  Administered 2015-10-13: 500 mL via INTRAVENOUS

## 2015-10-13 MED ORDER — HYDROMORPHONE HCL 1 MG/ML IJ SOLN
1.0000 mg | INTRAMUSCULAR | Status: AC
Start: 2015-10-13 — End: 2015-10-13
  Administered 2015-10-13: 1 mg via INTRAVENOUS
  Filled 2015-10-13: qty 1

## 2015-10-13 MED ORDER — IOPAMIDOL (ISOVUE-370) INJECTION 76%
125.0000 mL | Freq: Once | INTRAVENOUS | Status: AC | PRN
  Administered 2015-10-13: 125 mL via INTRAVENOUS

## 2015-10-13 MED ORDER — ONDANSETRON HCL 4 MG/2ML IJ SOLN
4.0000 mg | Freq: Once | INTRAMUSCULAR | Status: AC
  Administered 2015-10-13: 4 mg via INTRAVENOUS

## 2015-10-13 NOTE — ED Notes (Signed)
MD at bedside. 

## 2015-10-13 NOTE — ED Notes (Signed)
Discharge instructions reviewed with patient. Questions fielded by this RN. Patient verbalizes understanding of instructions. Patient discharged home in stable condition per Forbach MD . No acute distress noted at time of discharge.   

## 2015-10-13 NOTE — ED Notes (Signed)
Sats dropped after hydromorphone York CeriseForbach MD aware. Applied O2 at this time. 2 L [place] now 94%. Pt very sleepy states pain still 5/10.

## 2015-10-13 NOTE — Discharge Instructions (Signed)
We provided an extensive workup today which was very reassuring.  We believe that your choking and coughing episodes cause the tenderness you are experiencing in her chest wall.  We obtained a CT scan of your chest, abdomen, and pelvis, and there was no indication of acute infection.  There was some inflammation around your pancreas but your labs are normal and you have no tenderness to palpation of the area and no nausea or vomiting so there is no indication that you have pancreatitis.  I encourage you to discuss this with your regular doctor at the next available opportunity.  Continue taking your regular medications.  Return to the emergency department if you develop new or worsening symptoms that concern you.   Chest Wall Pain Chest wall pain is pain in or around the bones and muscles of your chest. Sometimes, an injury causes this pain. Sometimes, the cause may not be known. This pain may take several weeks or longer to get better. HOME CARE INSTRUCTIONS  Pay attention to any changes in your symptoms. Take these actions to help with your pain:   Rest as told by your health care provider.   Avoid activities that cause pain. These include any activities that use your chest muscles or your abdominal and side muscles to lift heavy items.   If directed, apply ice to the painful area:  Put ice in a plastic bag.  Place a towel between your skin and the bag.  Leave the ice on for 20 minutes, 2-3 times per day.  Take over-the-counter and prescription medicines only as told by your health care provider.  Do not use tobacco products, including cigarettes, chewing tobacco, and e-cigarettes. If you need help quitting, ask your health care provider.  Keep all follow-up visits as told by your health care provider. This is important. SEEK MEDICAL CARE IF:  You have a fever.  Your chest pain becomes worse.  You have new symptoms. SEEK IMMEDIATE MEDICAL CARE IF:  You have nausea or  vomiting.  You feel sweaty or light-headed.  You have a cough with phlegm (sputum) or you cough up blood.  You develop shortness of breath.   This information is not intended to replace advice given to you by your health care provider. Make sure you discuss any questions you have with your health care provider.   Document Released: 04/01/2005 Document Revised: 12/21/2014 Document Reviewed: 06/27/2014 Elsevier Interactive Patient Education [employer]2016 [employer]

## 2015-10-16 LAB — URINE CULTURE: SPECIAL REQUESTS: NORMAL

## 2015-11-07 ENCOUNTER — Telehealth: Payer: Self-pay | Admitting: *Deleted

## 2015-11-08 ENCOUNTER — Ambulatory Visit: Payer: Medicare Other | Admitting: Pain Medicine

## 2015-11-23 ENCOUNTER — Ambulatory Visit: Payer: Medicare Other | Attending: Pain Medicine | Admitting: Pain Medicine

## 2015-11-23 ENCOUNTER — Ambulatory Visit
Admission: RE | Admit: 2015-11-23 | Discharge: 2015-11-23 | Disposition: A | Discharge status: Home / Self Care | Payer: Medicare Other | Source: Ambulatory Visit | Attending: Pain Medicine | Admitting: Pain Medicine

## 2015-11-23 ENCOUNTER — Encounter (INDEPENDENT_AMBULATORY_CARE_PROVIDER_SITE_OTHER): Payer: Self-pay

## 2015-11-23 ENCOUNTER — Encounter: Payer: Self-pay | Admitting: Pain Medicine

## 2015-11-23 ENCOUNTER — Other Ambulatory Visit
Admission: RE | Admit: 2015-11-23 | Discharge: 2015-11-23 | Disposition: A | Discharge status: Home / Self Care | Payer: Medicare Other | Source: Ambulatory Visit | Attending: Pain Medicine | Admitting: Pain Medicine

## 2015-11-23 VITALS — BP 153/69 | HR 76 | Temp 98.2°F | Resp 18 | Ht 61.0 in | Wt 200.0 lb

## 2015-11-23 DIAGNOSIS — M47816 Spondylosis without myelopathy or radiculopathy, lumbar region: Secondary | ICD-10-CM | POA: Diagnosis not present

## 2015-11-23 DIAGNOSIS — M47812 Spondylosis without myelopathy or radiculopathy, cervical region: Secondary | ICD-10-CM

## 2015-11-23 DIAGNOSIS — M549 Dorsalgia, unspecified: Secondary | ICD-10-CM | POA: Diagnosis present

## 2015-11-23 DIAGNOSIS — M797 Fibromyalgia: Secondary | ICD-10-CM | POA: Insufficient documentation

## 2015-11-23 DIAGNOSIS — M170 Bilateral primary osteoarthritis of knee: Secondary | ICD-10-CM

## 2015-11-23 DIAGNOSIS — M25559 Pain in unspecified hip: Secondary | ICD-10-CM | POA: Diagnosis not present

## 2015-11-23 DIAGNOSIS — M1712 Unilateral primary osteoarthritis, left knee: Secondary | ICD-10-CM | POA: Insufficient documentation

## 2015-11-23 DIAGNOSIS — R209 Unspecified disturbances of skin sensation: Secondary | ICD-10-CM | POA: Diagnosis not present

## 2015-11-23 DIAGNOSIS — F419 Anxiety disorder, unspecified: Secondary | ICD-10-CM | POA: Insufficient documentation

## 2015-11-23 DIAGNOSIS — M160 Bilateral primary osteoarthritis of hip: Secondary | ICD-10-CM

## 2015-11-23 DIAGNOSIS — G8929 Other chronic pain: Secondary | ICD-10-CM

## 2015-11-23 DIAGNOSIS — M5136 Other intervertebral disc degeneration, lumbar region: Secondary | ICD-10-CM | POA: Diagnosis not present

## 2015-11-23 DIAGNOSIS — Z5181 Encounter for therapeutic drug level monitoring: Secondary | ICD-10-CM

## 2015-11-23 DIAGNOSIS — R200 Anesthesia of skin: Secondary | ICD-10-CM | POA: Insufficient documentation

## 2015-11-23 DIAGNOSIS — M79606 Pain in leg, unspecified: Secondary | ICD-10-CM | POA: Insufficient documentation

## 2015-11-23 DIAGNOSIS — E039 Hypothyroidism, unspecified: Secondary | ICD-10-CM | POA: Insufficient documentation

## 2015-11-23 DIAGNOSIS — Z791 Long term (current) use of non-steroidal anti-inflammatories (NSAID): Secondary | ICD-10-CM | POA: Diagnosis not present

## 2015-11-23 DIAGNOSIS — M542 Cervicalgia: Secondary | ICD-10-CM | POA: Insufficient documentation

## 2015-11-23 DIAGNOSIS — E7800 Pure hypercholesterolemia, unspecified: Secondary | ICD-10-CM | POA: Insufficient documentation

## 2015-11-23 DIAGNOSIS — Z96651 Presence of right artificial knee joint: Secondary | ICD-10-CM | POA: Insufficient documentation

## 2015-11-23 DIAGNOSIS — M25562 Pain in left knee: Secondary | ICD-10-CM | POA: Diagnosis not present

## 2015-11-23 DIAGNOSIS — M545 Low back pain, unspecified: Secondary | ICD-10-CM

## 2015-11-23 DIAGNOSIS — G43909 Migraine, unspecified, not intractable, without status migrainosus: Secondary | ICD-10-CM | POA: Diagnosis not present

## 2015-11-23 DIAGNOSIS — M792 Neuralgia and neuritis, unspecified: Secondary | ICD-10-CM | POA: Diagnosis not present

## 2015-11-23 DIAGNOSIS — M25561 Pain in right knee: Secondary | ICD-10-CM | POA: Diagnosis not present

## 2015-11-23 DIAGNOSIS — M4726 Other spondylosis with radiculopathy, lumbar region: Secondary | ICD-10-CM | POA: Insufficient documentation

## 2015-11-23 DIAGNOSIS — M533 Sacrococcygeal disorders, not elsewhere classified: Secondary | ICD-10-CM | POA: Diagnosis not present

## 2015-11-23 DIAGNOSIS — I10 Essential (primary) hypertension: Secondary | ICD-10-CM | POA: Insufficient documentation

## 2015-11-23 DIAGNOSIS — M47898 Other spondylosis, sacral and sacrococcygeal region: Secondary | ICD-10-CM | POA: Diagnosis not present

## 2015-11-23 DIAGNOSIS — F1190 Opioid use, unspecified, uncomplicated: Secondary | ICD-10-CM

## 2015-11-23 DIAGNOSIS — F32A Depression, unspecified: Secondary | ICD-10-CM | POA: Insufficient documentation

## 2015-11-23 DIAGNOSIS — F329 Major depressive disorder, single episode, unspecified: Secondary | ICD-10-CM | POA: Insufficient documentation

## 2015-11-23 DIAGNOSIS — Z8719 Personal history of other diseases of the digestive system: Secondary | ICD-10-CM | POA: Insufficient documentation

## 2015-11-23 DIAGNOSIS — I700 Atherosclerosis of aorta: Secondary | ICD-10-CM | POA: Diagnosis not present

## 2015-11-23 DIAGNOSIS — M25552 Pain in left hip: Secondary | ICD-10-CM | POA: Diagnosis present

## 2015-11-23 DIAGNOSIS — E785 Hyperlipidemia, unspecified: Secondary | ICD-10-CM | POA: Insufficient documentation

## 2015-11-23 DIAGNOSIS — G5600 Carpal tunnel syndrome, unspecified upper limb: Secondary | ICD-10-CM | POA: Insufficient documentation

## 2015-11-23 DIAGNOSIS — Z0189 Encounter for other specified special examinations: Secondary | ICD-10-CM

## 2015-11-23 DIAGNOSIS — Z87891 Personal history of nicotine dependence: Secondary | ICD-10-CM | POA: Insufficient documentation

## 2015-11-23 DIAGNOSIS — Z79899 Other long term (current) drug therapy: Secondary | ICD-10-CM

## 2015-11-23 DIAGNOSIS — K219 Gastro-esophageal reflux disease without esophagitis: Secondary | ICD-10-CM | POA: Diagnosis not present

## 2015-11-23 DIAGNOSIS — M5416 Radiculopathy, lumbar region: Secondary | ICD-10-CM | POA: Diagnosis not present

## 2015-11-23 DIAGNOSIS — Z79891 Long term (current) use of opiate analgesic: Secondary | ICD-10-CM | POA: Diagnosis not present

## 2015-11-23 DIAGNOSIS — M353 Polymyalgia rheumatica: Secondary | ICD-10-CM | POA: Insufficient documentation

## 2015-11-23 DIAGNOSIS — M1990 Unspecified osteoarthritis, unspecified site: Secondary | ICD-10-CM | POA: Insufficient documentation

## 2015-11-23 DIAGNOSIS — M4806 Spinal stenosis, lumbar region: Secondary | ICD-10-CM | POA: Insufficient documentation

## 2015-11-23 DIAGNOSIS — M79605 Pain in left leg: Secondary | ICD-10-CM | POA: Diagnosis present

## 2015-11-23 DIAGNOSIS — K5790 Diverticulosis of intestine, part unspecified, without perforation or abscess without bleeding: Secondary | ICD-10-CM | POA: Insufficient documentation

## 2015-11-23 LAB — SEDIMENTATION RATE: SED RATE: 10 mm/h (ref 0–30)

## 2015-11-23 LAB — COMPREHENSIVE METABOLIC PANEL
ALBUMIN: 4 g/dL (ref 3.5–5.0)
ALK PHOS: 58 U/L (ref 38–126)
ALT: 16 U/L (ref 14–54)
AST: 21 U/L (ref 15–41)
Anion gap: 8 (ref 5–15)
BILIRUBIN TOTAL: 1.2 mg/dL (ref 0.3–1.2)
BUN: 13 mg/dL (ref 6–20)
CHLORIDE: 108 mmol/L (ref 101–111)
CO2: 24 mmol/L (ref 22–32)
Calcium: 9.2 mg/dL (ref 8.9–10.3)
Creatinine, Ser: 0.6 mg/dL (ref 0.44–1.00)
GFR calc Af Amer: >60 mL/min (ref >60)
GLUCOSE: 113 mg/dL — AB (ref 65–99)
POTASSIUM: 3.9 mmol/L (ref 3.5–5.1)
Sodium: 140 mmol/L (ref 135–145)
Total Protein: 6.8 g/dL (ref 6.5–8.1)

## 2015-11-23 LAB — C-REACTIVE PROTEIN: CRP: 0.6 mg/dL (ref <1.0)

## 2015-11-23 LAB — VITAMIN B12: VITAMIN B 12: 271 pg/mL (ref 180–914)

## 2015-11-23 LAB — MAGNESIUM: Magnesium: 1.9 mg/dL (ref 1.7–2.4)

## 2015-11-23 NOTE — Patient Instructions (Signed)
Instructed to get labwork and xrays done today in the medical mall. 

## 2015-11-23 NOTE — Progress Notes (Signed)
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 PELVIS 2-3 VIEWS LEFT  . DG HIP UNILAT W OR W/O PELVIS 2-3 VIEWS RIGHT  . DG Si Joints  . Compliance Drug Analysis, Ur  . Comprehensive metabolic panel  . C-reactive protein  . Magnesium  . Sedimentation rate  . Vitamin B12  . 25-Hydroxyvitamin D Lcms D2+D3    Interventional Therapies: Scheduled:  None at this time.    Considering:   Diagnostic bilateral lumbar facet block under fluoroscopic guidance and IV sedation. Possible bilateral lumbar facet radiofrequency ablation under fluoroscopic guidance and IV sedation the pending on the results of the diagnostic injection.    PRN Procedures:  None at this time.    Referral(s) or Consult(s): Medical psychology consult for substance use disorder evaluation  Medications administered during this visit: Alice [name] had no medications administered  during this visit.  Prescriptions ordered during this visit: New Prescriptions   No medications on file    Requested PM Follow-up: Return in about 2 weeks (around 12/07/2015) for 2nd Visit Eval, after competing all the blood work and x-rays..  Future Appointments Date Time Provider Department Center  12/07/2015 2:40 PM Delano Metz, MD [facility] None     Primary Care Physician: [facility] Location: [facility] Note by: Altagracia Rone A. Laban Emperor, M.D, DABA, DABAPM, DABPM, DABIPP, FIPP  Pain Score Disclaimer: We use the NRS-11 scale. This is a self-reported, subjective measurement of pain severity with only modest accuracy. It is used primarily to identify changes within a particular patient. It must be understood that outpatient pain scales are significantly less accurate that those used for research, where they can be applied under ideal controlled circumstances with minimal exposure to variables. In reality,  the score is likely to be a combination of pain intensity and pain affect, where pain affect describes the degree of emotional arousal or changes in action readiness caused by the sensory experience of pain. Factors such as social and work situation, setting, emotional state, anxiety levels, expectation, and prior pain experience may influence pain perception and show large inter-individual differences that may also be affected by time variables.  Patient instructions provided during this appointment: Patient Instructions  Instructed to get labwork and xrays done today in the medical mall

## 2015-11-23 NOTE — Progress Notes (Signed)
Safety precautions to be maintained throughout the outpatient stay will include: orient to surroundings, keep bed in low position, maintain call bell within reach at all times, provide assistance with transfer out of bed and ambulation.  

## 2015-11-27 LAB — 25-HYDROXY VITAMIN D LCMS D2+D3
25-Hydroxy, Vitamin D-2: 8.5 ng/mL
25-Hydroxy, Vitamin D-3: 31 ng/mL
25-Hydroxy, Vitamin D: 40 ng/mL

## 2015-11-29 NOTE — Progress Notes (Signed)
Results were reviewed and found to be: mildly abnormal    Review would suggest interventional pain management techniques may be of benefit  No acute injury or pathology identified

## 2015-11-29 NOTE — Progress Notes (Signed)
Results were reviewed and found to be: abnormal    Review would suggest interventional pain management techniques may be of benefit  Surgical consultation may be of benefit

## 2015-12-02 LAB — COMPLIANCE DRUG ANALYSIS, UR: PDF: 0

## 2015-12-04 ENCOUNTER — Ambulatory Visit: Payer: Medicare Other | Attending: Pain Medicine | Admitting: Pain Medicine

## 2015-12-04 ENCOUNTER — Encounter: Payer: Self-pay | Admitting: Pain Medicine

## 2015-12-04 VITALS — BP 171/93 | HR 87 | Temp 97.9°F | Resp 18 | Ht 61.0 in | Wt 195.0 lb

## 2015-12-04 DIAGNOSIS — Z87891 Personal history of nicotine dependence: Secondary | ICD-10-CM | POA: Insufficient documentation

## 2015-12-04 DIAGNOSIS — M792 Neuralgia and neuritis, unspecified: Secondary | ICD-10-CM

## 2015-12-04 DIAGNOSIS — I700 Atherosclerosis of aorta: Secondary | ICD-10-CM | POA: Diagnosis not present

## 2015-12-04 DIAGNOSIS — Z96651 Presence of right artificial knee joint: Secondary | ICD-10-CM

## 2015-12-04 DIAGNOSIS — M47816 Spondylosis without myelopathy or radiculopathy, lumbar region: Secondary | ICD-10-CM

## 2015-12-04 DIAGNOSIS — K5790 Diverticulosis of intestine, part unspecified, without perforation or abscess without bleeding: Secondary | ICD-10-CM | POA: Diagnosis not present

## 2015-12-04 DIAGNOSIS — M797 Fibromyalgia: Secondary | ICD-10-CM | POA: Diagnosis not present

## 2015-12-04 DIAGNOSIS — Z6835 Body mass index (BMI) 35.0-35.9, adult: Secondary | ICD-10-CM | POA: Diagnosis not present

## 2015-12-04 DIAGNOSIS — M25561 Pain in right knee: Secondary | ICD-10-CM | POA: Diagnosis present

## 2015-12-04 DIAGNOSIS — M25551 Pain in right hip: Secondary | ICD-10-CM | POA: Insufficient documentation

## 2015-12-04 DIAGNOSIS — M5117 Intervertebral disc disorders with radiculopathy, lumbosacral region: Secondary | ICD-10-CM | POA: Insufficient documentation

## 2015-12-04 DIAGNOSIS — T8484XS Pain due to internal orthopedic prosthetic devices, implants and grafts, sequela: Secondary | ICD-10-CM

## 2015-12-04 DIAGNOSIS — G8929 Other chronic pain: Secondary | ICD-10-CM | POA: Diagnosis not present

## 2015-12-04 DIAGNOSIS — M545 Low back pain, unspecified: Secondary | ICD-10-CM

## 2015-12-04 DIAGNOSIS — Z90710 Acquired absence of both cervix and uterus: Secondary | ICD-10-CM | POA: Diagnosis not present

## 2015-12-04 DIAGNOSIS — M25562 Pain in left knee: Secondary | ICD-10-CM | POA: Insufficient documentation

## 2015-12-04 DIAGNOSIS — M1712 Unilateral primary osteoarthritis, left knee: Secondary | ICD-10-CM | POA: Diagnosis not present

## 2015-12-04 DIAGNOSIS — F32A Depression, unspecified: Secondary | ICD-10-CM

## 2015-12-04 DIAGNOSIS — M533 Sacrococcygeal disorders, not elsewhere classified: Secondary | ICD-10-CM | POA: Insufficient documentation

## 2015-12-04 DIAGNOSIS — R200 Anesthesia of skin: Secondary | ICD-10-CM | POA: Diagnosis not present

## 2015-12-04 DIAGNOSIS — Z791 Long term (current) use of non-steroidal anti-inflammatories (NSAID): Secondary | ICD-10-CM | POA: Diagnosis not present

## 2015-12-04 DIAGNOSIS — Z79891 Long term (current) use of opiate analgesic: Secondary | ICD-10-CM | POA: Diagnosis not present

## 2015-12-04 DIAGNOSIS — F1190 Opioid use, unspecified, uncomplicated: Secondary | ICD-10-CM

## 2015-12-04 DIAGNOSIS — Z0189 Encounter for other specified special examinations: Secondary | ICD-10-CM

## 2015-12-04 DIAGNOSIS — M25552 Pain in left hip: Secondary | ICD-10-CM | POA: Diagnosis not present

## 2015-12-04 DIAGNOSIS — E785 Hyperlipidemia, unspecified: Secondary | ICD-10-CM | POA: Insufficient documentation

## 2015-12-04 DIAGNOSIS — T8484XA Pain due to internal orthopedic prosthetic devices, implants and grafts, initial encounter: Secondary | ICD-10-CM | POA: Insufficient documentation

## 2015-12-04 DIAGNOSIS — K219 Gastro-esophageal reflux disease without esophagitis: Secondary | ICD-10-CM | POA: Insufficient documentation

## 2015-12-04 DIAGNOSIS — M5416 Radiculopathy, lumbar region: Secondary | ICD-10-CM

## 2015-12-04 DIAGNOSIS — I10 Essential (primary) hypertension: Secondary | ICD-10-CM | POA: Insufficient documentation

## 2015-12-04 DIAGNOSIS — M25559 Pain in unspecified hip: Secondary | ICD-10-CM | POA: Diagnosis not present

## 2015-12-04 DIAGNOSIS — M159 Polyosteoarthritis, unspecified: Secondary | ICD-10-CM

## 2015-12-04 DIAGNOSIS — M4726 Other spondylosis with radiculopathy, lumbar region: Secondary | ICD-10-CM | POA: Diagnosis not present

## 2015-12-04 DIAGNOSIS — M160 Bilateral primary osteoarthritis of hip: Secondary | ICD-10-CM | POA: Insufficient documentation

## 2015-12-04 DIAGNOSIS — F329 Major depressive disorder, single episode, unspecified: Secondary | ICD-10-CM | POA: Diagnosis not present

## 2015-12-04 DIAGNOSIS — G5600 Carpal tunnel syndrome, unspecified upper limb: Secondary | ICD-10-CM | POA: Diagnosis not present

## 2015-12-04 DIAGNOSIS — M150 Primary generalized (osteo)arthritis: Secondary | ICD-10-CM

## 2015-12-04 MED ORDER — HYDROCODONE-ACETAMINOPHEN 5-325 MG PO TABS: 1.0000 | ORAL_TABLET | Freq: Three times a day (TID) | ORAL | 0 refills | Status: DC | PRN

## 2015-12-04 MED ORDER — MELOXICAM 15 MG PO TABS: 15.0000 mg | ORAL_TABLET | Freq: Every day | ORAL | 0 refills | Status: DC

## 2015-12-04 NOTE — Patient Instructions (Signed)
You were given a prescription for Hydrocodone today. You are scheduled to return for a knee injection. Do not eat 8 hours before your appointment. You may have clear liquids with any meds you need to take up until 3 hours before your appt.

## 2015-12-04 NOTE — Progress Notes (Signed)
Safety precautions to be maintained throughout the outpatient stay will include: orient to surroundings, keep bed in low position, maintain call bell within reach at all times, provide assistance with transfer out of bed and ambulation.  

## 2015-12-04 NOTE — Progress Notes (Signed)
Patient's Name: Alice [name]  Patient type: Established  MRN: [id number]  Service setting: Ambulatory outpatient  DOB: 05/26/1940  Location: [facility]  DOS: 12/04/2015  Primary Care Physician: [facility]  Note by: Para March A. Laban Emperor, M.D, DABA, DABAPM, DABPM, DABIPP, FIPP  Referring Physician: [facility]  Specialty: Board-Certified Interventional Pain Management  Last Visit to Pain Management: 11/23/2015   Primary Reason(s) for Visit: Encounter for evaluation before starting new chronic pain management plan of care (Level of risk: moderate) CC: Back Pain (lower) and Knee Pain (bilateral)   HPI  [name] is a 75 y.o. year old, female patient, who returns today as an established patient. She has Anxiety; Carpal tunnel syndrome; Depression; Fibromyalgia; GERD (gastroesophageal reflux disease); History of dysphagia; Hypothyroidism; Hyperlipidemia; Hypertension; Migraine headache; Neuralgia; No transfusions per religious beliefs; Osteoarthritis (arthritis due to wear and tear of joints); Chronic pain; Chronic low back pain (Location of Tertiary source of pain) (Bilateral) (L>R); Chronic lower extremity pain (Bilateral) (L>R); Chronic knee pain (Location of Primary Source of Pain) (Bilateral) (L>R); Chronic hip pain (Location of Secondary source of pain) (Bilateral) (L>R); 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); Lumbar facet syndrome (Bilateral) (L>R); Chronic sacroiliac joint pain (Bilateral) (L>R); Long term current use of opiate analgesic; Long term prescription opiate use; Opiate use; Long term current use of non-steroidal anti-inflammatories (NSAID); Disturbance of skin sensation; Diverticulosis; Neurogenic pain; 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 Her primarily concern today is the Back Pain (lower) and Knee Pain (bilateral)   Pain Assessment: Self-Reported Pain Score: 5              Reported level is compatible with observation       Pain Type: Chronic pain Pain Location: Back Pain Orientation: Lower Pain Descriptors / Indicators: Sharp, Aching Pain Frequency: Constant  The patient comes into the clinics today for post-procedure evaluation on the interventional treatment done on 11/23/2015. In addition, she comes in today for pharmacological management of her chronic pain.  The patient  reports that she does not use drugs.    Date of Last Visit: 11/23/15 Service Provided on Last Visit: Evaluation  Controlled Substance Pharmacotherapy Assessment & REMS (Risk Evaluation and Mitigation Strategy)  Analgesic: Hydrocodone/APAP 5/325 3 tablets per day (15 mg/day of hydrocodone) MME/day: 15 mg/day Pill Count: N/A. We will be taking over the medications today. Pharmacokinetics: Onset of action (Liberation/Absorption): Within expected pharmacological parameters Time to Peak effect (Distribution): Timing and results are as within normal expected parameters Duration of action (Metabolism/Excretion): Within normal limits for medication Pharmacodynamics: Analgesic Effect: More than 50% Activity Facilitation: Medication(s) allow patient to sit, stand, walk, and do the basic ADLs Perceived Effectiveness: Described as relatively effective, allowing for increase in activities of daily living (ADL) Side-effects or Adverse reactions: None reported Monitoring: [place] PMP: Online review of the past 38-month period conducted. Compliant with practice rules and regulations Last UDS on record: Summary   Date Value Ref Range Status  11/23/2015 FINAL  Final    Comment:    ==================================================================== TOXASSURE COMP DRUG ANALYSIS,UR ==================================================================== Test  Result       Flag       Units Drug Present and Declared for Prescription Verification   Gabapentin                     PRESENT      EXPECTED   Paroxetine                     PRESENT      EXPECTED ==================================================================== Test                      Result    Flag   Units      Ref Range   Creatinine              147              mg/dL      >=16 ==================================================================== Declared Medications:  The flagging and interpretation on this report are based on the  following declared medications.  Unexpected results may arise from  inaccuracies in the declared medications.  **Note: The testing scope of this panel includes these medications:  Gabapentin  Paroxetine (Paxil)  **Note: The testing scope of this panel does not include following  reported medications:  Etodolac (Lodine)  Ranitidine (Zantac)  Simvastatin (Zocor)  Vitamin D3 ==================================================================== For clinical consultation, please call [phone]. ====================================================================    UDS interpretation: Compliant          Medication Assessment Form: Reviewed. Patient indicates being compliant with therapy Treatment compliance: Not applicable yet Risk Assessment: Aberrant Behavior: None observed today Substance Use Disorder (SUD) Risk Level: High Risk of opioid abuse or dependence: 0.7-3.0% with doses ? 36 MME/day and 6.1-26% with doses ? 120 MME/day. Opioid Risk Tool (ORT) Score:  10 High Risk for Opioid Abuse (Score >8) Depression Scale Score: PHQ-2: PHQ-2 Total Score: 2 21.1% Probability of  major depressive disorder (2) PHQ-9: PHQ-9 Total Score: 9 Mild depression (5-9)  Pharmacologic Plan: Today we may be taking over the patient's pharmacological regimen. See below  Previous Illicit Drug Screen Labs(s): No results found for: MDMA, COCAINSCRNUR, PCPSCRNUR, [facility]  Laboratory Chemistry  Inflammation Markers Lab Results  Component Value Date   ESRSEDRATE 10 11/23/2015   CRP 0.6 11/23/2015    Renal Function Lab Results  Component Value Date   BUN 13 11/23/2015   CREATININE 0.60 11/23/2015   GFRAA >60 11/23/2015   GFRNONAA >60 11/23/2015    Hepatic Function Lab Results  Component Value Date   AST 21 11/23/2015   ALT 16 11/23/2015   ALBUMIN 4.0 11/23/2015    Electrolytes Lab Results  Component Value Date   NA 140 11/23/2015   K 3.9 11/23/2015   CL 108 11/23/2015   CALCIUM 9.2 11/23/2015   MG 1.9 11/23/2015    Pain Modulating Vitamins Lab Results  Component Value Date   25OHVITD1 40 11/23/2015   25OHVITD2 8.5 11/23/2015   25OHVITD3 31 11/23/2015   VITAMINB12 271 11/23/2015    Coagulation Parameters Lab Results  Component Value Date   INR 1.0 10/21/2013   LABPROT 13.5 10/21/2013   APTT 34.8 10/21/2013   PLT 202 10/13/2015    Cardiovascular Lab Results  Component Value Date   HGB 15.9 10/13/2015   HCT 44.7 10/13/2015    Note: Lab results reviewed.  Recent Diagnostic Imaging  Dg Lumbar Spine Complete W/bend  Result Date: 11/23/2015 CLINICAL DATA:  Chronic low back pain. EXAM: LUMBAR SPINE - COMPLETE  WITH BENDING VIEWS COMPARISON:  07/12/2014 lumbar spine MRI FINDINGS: This report assumes 5 non rib-bearing lumbar vertebrae. Lumbar vertebral body heights are preserved, with no fracture. There is moderate to severe degenerative disc disease at L2-3 and L5-S1. There is moderate degenerative disc disease at L3-4. There is mild spondylosis at the remaining lumbar disc levels. There is 3 mm retrolisthesis at L2-3 on the neutral view, which also  measures 3 mm on the flexion and extension views. There is 3 mm retrolisthesis at L3-4 on the neutral view, which measures 2 mm on the flexion view and 4 mm on the extension view. There is 3 mm anterolisthesis at L4-5 on the neutral view, which also measures 3 mm on the flexion and extension views. Moderate facet arthropathy seen bilaterally in the lower lumbar spine. No aggressive appearing focal osseous lesions. Aortic atherosclerosis. Surgical clips in the right upper quadrant of the abdomen. IMPRESSION: 1. Moderate multilevel degenerative disc disease in the lumbar spine, most prominent at L2-3 and L5-S1. 2. Mild multilevel spondylolisthesis in the lumbar spine as described, noting mild dynamic instability at L3-4 on the flexion and extension views. 3. Moderate facet arthropathy bilaterally in the lower lumbar spine. 4. Aortic atherosclerosis. Electronically Signed   By: Delbert PhenixJason A Poff M.D.   On: 11/23/2015 16:20   Dg Si Joints  Result Date: 11/23/2015 CLINICAL DATA:  Chronic back, hip, and knee pain for years, no acute abnormality EXAM: BILATERAL SACROILIAC JOINTS - 3+ VIEW COMPARISON:  None. FINDINGS: The SI joints appear corticated. There are degenerative changes in both SI joints with sclerosis and spurring present. The sacral foramina appear normal. No acute abnormality is seen. IMPRESSION: Degenerative change of the SI joints.  No evidence of sacroiliitis. Electronically Signed   By: Dwyane DeePaul  Barry M.D.   On: 11/23/2015 16:13   Dg Knee 1-2 Views Left  Result Date: 11/23/2015 CLINICAL DATA:  Chronic bilateral knee pain. EXAM: RIGHT KNEE - 1-2 VIEW; LEFT KNEE - 1-2 VIEW COMPARISON:  Right knee radiograph 10/21/2013 FINDINGS: No evidence of fracture, dislocation, or joint effusion. There is 3 compartment moderate to advanced osteoarthrosis of the left knee with joint space narrowing, subchondral sclerosis and moderate in size osteophyte formation. There has been a prior total right knee arthroplasty  without evidence of hardware loosening or latter radiographic complications. Soft tissues are unremarkable. IMPRESSION: Three compartment moderate to advanced osteoarthritis of the left knee. Post total right knee arthroplasty without evidence of radiographic complication. Electronically Signed   By: Ted Mcalpineobrinka  Dimitrova M.D.   On: 11/23/2015 16:12   Dg Knee 1-2 Views Right  Result Date: 11/23/2015 CLINICAL DATA:  Chronic bilateral knee pain. EXAM: RIGHT KNEE - 1-2 VIEW; LEFT KNEE - 1-2 VIEW COMPARISON:  Right knee radiograph 10/21/2013 FINDINGS: No evidence of fracture, dislocation, or joint effusion. There is 3 compartment moderate to advanced osteoarthrosis of the left knee with joint space narrowing, subchondral sclerosis and moderate in size osteophyte formation. There has been a prior total right knee arthroplasty without evidence of hardware loosening or latter radiographic complications. Soft tissues are unremarkable. IMPRESSION: Three compartment moderate to advanced osteoarthritis of the left knee. Post total right knee arthroplasty without evidence of radiographic complication. Electronically Signed   By: Ted Mcalpineobrinka  Dimitrova M.D.   On: 11/23/2015 16:12   Dg Hip Unilat W Or W/o Pelvis 2-3 Views Left  Result Date: 11/23/2015 CLINICAL DATA:  Chronic back pain and bilateral hip and knee pain for years EXAM: DG HIP (WITH OR WITHOUT PELVIS) 2-3V LEFT  COMPARISON:  None. FINDINGS: For age only mild degenerative joint disease of the hips is seen. The pelvic rami are intact. The SI joints are corticated. No acute abnormality is seen. IMPRESSION: Mild degenerative change of the hips for age.  No acute abnormality. Electronically Signed   By: Dwyane Dee M.D.   On: 11/23/2015 16:12   Dg Hip Unilat W Or W/o Pelvis 2-3 Views Right  Result Date: 11/23/2015 CLINICAL DATA:  Chronic back and hip and knee pain for years, no acute abnormality EXAM: DG HIP (WITH OR WITHOUT PELVIS) 2-3V RIGHT COMPARISON:  None.  FINDINGS: There is mild degenerative joint disease of the hips for age. No acute abnormality is seen. The pelvic rami are intact. The SI joints appear corticated. IMPRESSION: Mild degenerative change of the hips for age.  No acute abnormality. Electronically Signed   By: Dwyane Dee M.D.   On: 11/23/2015 16:12   Lumbosacral Imaging: Lumbar MR wo contrast:  Results for orders placed in visit on 07/12/14  MR L Spine Ltd W/O Cm   Narrative * PRIOR REPORT IMPORTED FROM AN EXTERNAL SYSTEM *   CLINICAL DATA:  Low back pain and bilateral leg pain, worse on the  right. Symptoms for 3 weeks.   EXAM:  MRI LUMBAR SPINE WITHOUT CONTRAST   TECHNIQUE:  Multiplanar, multisequence MR imaging of the lumbar spine was  performed. No intravenous contrast was administered.   COMPARISON:  07/16/2013   FINDINGS:  Spinal numbering as before.   Scattered incidental vertebral hemangiomas, best demonstrated in the  L5 and L4 bodies. No marrow signal abnormality suggestive of  fracture, infection, or neoplasm. Normal conus signal and  morphology. No perispinal abnormality to explain back pain. Multiple  bilateral renal cysts are noted, partly visualized.   Degenerative changes:   T11-12: Central disc protrusion with buttressing osteophytes. The  disc does not contact the cord or cause other neural impingement.   T12- L1: Unremarkable.   L1-L2: Mild disc bulging.  No nerve impingement.   L2-L3: Minimal degenerative facet overgrowth. There is  circumferential disc bulging with endplate osteophytes and slight  retrolisthesis. The inferior foramina are effaced, but there is no  nerve compression.   L3-L4: Disc bulging which is mild and circumferential. Mild facet  are at hypertrophy, especially on the right. No nerve compression.   L4-L5: Bilateral facet arthropathy with overgrowth. There is mild  circumferential disc bulging. No significant stenosis.   L5-S1:Small central disc protrusion without  stenosis. Endplate spurs  and small facet spurs narrow the inferior foramina, without  compression.   IMPRESSION:  1. No acute findings to explain recent back pain.  2. Diffuse degenerative disc and facet change without significant  stenosis or progression from 2015.    Electronically Signed    By: Marnee Spring M.D.    On: 07/12/2014 15:45       Lumbar DG Bending views:  Results for orders placed during the hospital encounter of 11/23/15  DG Lumbar Spine Complete W/Bend   Narrative CLINICAL DATA:  Chronic low back pain.  EXAM: LUMBAR SPINE - COMPLETE WITH BENDING VIEWS  COMPARISON:  07/12/2014 lumbar spine MRI  FINDINGS: This report assumes 5 non rib-bearing lumbar vertebrae.  Lumbar vertebral body heights are preserved, with no fracture.  There is moderate to severe degenerative disc disease at L2-3 and L5-S1. There is moderate degenerative disc disease at L3-4. There is mild spondylosis at the remaining lumbar disc levels. There is 3 mm retrolisthesis at L2-3 on  the neutral view, which also measures 3 mm on the flexion and extension views. There is 3 mm retrolisthesis at L3-4 on the neutral view, which measures 2 mm on the flexion view and 4 mm on the extension view. There is 3 mm anterolisthesis at L4-5 on the neutral view, which also measures 3 mm on the flexion and extension views. Moderate facet arthropathy seen bilaterally in the lower lumbar spine. No aggressive appearing focal osseous lesions. Aortic atherosclerosis. Surgical clips in the right upper quadrant of the abdomen.  IMPRESSION: 1. Moderate multilevel degenerative disc disease in the lumbar spine, most prominent at L2-3 and L5-S1. 2. Mild multilevel spondylolisthesis in the lumbar spine as described, noting mild dynamic instability at L3-4 on the flexion and extension views. 3. Moderate facet arthropathy bilaterally in the lower lumbar spine. 4. Aortic atherosclerosis.   Electronically  Signed   By: Delbert Phenix M.D.   On: 11/23/2015 16:20    Sacroiliac Joint Imaging: Sacroiliac Joint DG:  Results for orders placed during the hospital encounter of 11/23/15  DG Si Joints   Narrative CLINICAL DATA:  Chronic back, hip, and knee pain for years, no acute abnormality  EXAM: BILATERAL SACROILIAC JOINTS - 3+ VIEW  COMPARISON:  None.  FINDINGS: The SI joints appear corticated. There are degenerative changes in both SI joints with sclerosis and spurring present. The sacral foramina appear normal. No acute abnormality is seen.  IMPRESSION: Degenerative change of the SI joints.  No evidence of sacroiliitis.   Electronically Signed   By: Dwyane Dee M.D.   On: 11/23/2015 16:13    Hip Imaging: Hip-R DG 2-3 views:  Results for orders placed during the hospital encounter of 11/23/15  DG HIP UNILAT W OR W/O PELVIS 2-3 VIEWS RIGHT   Narrative CLINICAL DATA:  Chronic back and hip and knee pain for years, no acute abnormality  EXAM: DG HIP (WITH OR WITHOUT PELVIS) 2-3V RIGHT  COMPARISON:  None.  FINDINGS: There is mild degenerative joint disease of the hips for age. No acute abnormality is seen. The pelvic rami are intact. The SI joints appear corticated.  IMPRESSION: Mild degenerative change of the hips for age.  No acute abnormality.   Electronically Signed   By: Dwyane Dee M.D.   On: 11/23/2015 16:12    Hip-L DG 2-3 views:  Results for orders placed during the hospital encounter of 11/23/15  DG HIP UNILAT W OR W/O PELVIS 2-3 VIEWS LEFT   Narrative CLINICAL DATA:  Chronic back pain and bilateral hip and knee pain for years  EXAM: DG HIP (WITH OR WITHOUT PELVIS) 2-3V LEFT  COMPARISON:  None.  FINDINGS: For age only mild degenerative joint disease of the hips is seen. The pelvic rami are intact. The SI joints are corticated. No acute abnormality is seen.  IMPRESSION: Mild degenerative change of the hips for age.  No acute  abnormality.   Electronically Signed   By: Dwyane Dee M.D.   On: 11/23/2015 16:12    Knee Imaging: Knee-R DG 1-2 views:  Results for orders placed during the hospital encounter of 11/23/15  DG Knee 1-2 Views Right   Narrative CLINICAL DATA:  Chronic bilateral knee pain.  EXAM: RIGHT KNEE - 1-2 VIEW; LEFT KNEE - 1-2 VIEW  COMPARISON:  Right knee radiograph 10/21/2013  FINDINGS: No evidence of fracture, dislocation, or joint effusion. There is 3 compartment moderate to advanced osteoarthrosis of the left knee with joint space narrowing, subchondral sclerosis and moderate in size  osteophyte formation.  There has been a prior total right knee arthroplasty without evidence of hardware loosening or latter radiographic complications. Soft tissues are unremarkable.  IMPRESSION: Three compartment moderate to advanced osteoarthritis of the left knee.  Post total right knee arthroplasty without evidence of radiographic complication.   Electronically Signed   By: Ted Mcalpine M.D.   On: 11/23/2015 16:12    Knee-L DG 1-2 views:  Results for orders placed during the hospital encounter of 11/23/15  DG Knee 1-2 Views Left   Narrative CLINICAL DATA:  Chronic bilateral knee pain.  EXAM: RIGHT KNEE - 1-2 VIEW; LEFT KNEE - 1-2 VIEW  COMPARISON:  Right knee radiograph 10/21/2013  FINDINGS: No evidence of fracture, dislocation, or joint effusion. There is 3 compartment moderate to advanced osteoarthrosis of the left knee with joint space narrowing, subchondral sclerosis and moderate in size osteophyte formation.  There has been a prior total right knee arthroplasty without evidence of hardware loosening or latter radiographic complications. Soft tissues are unremarkable.  IMPRESSION: Three compartment moderate to advanced osteoarthritis of the left knee.  Post total right knee arthroplasty without evidence of radiographic complication.   Electronically Signed    By: Ted Mcalpine M.D.   On: 11/23/2015 16:12    Note: Imaging results reviewed.  Meds  The patient has a current medication list which includes the following prescription(s): [facility] d3, gabapentin, paroxetine, ranitidine, simvastatin, hydrocodone-acetaminophen, and meloxicam.  Current Outpatient Prescriptions on File Prior to Visit  Medication Sig  . [facility] D3 5000 units capsule TAKE 5,000 UNITS BY MOUTH ONCE DAILY.  Marland Kitchen gabapentin (NEURONTIN) 300 MG capsule Take 300 mg by mouth 3 (three) times daily.  Marland Kitchen PARoxetine (PAXIL) 40 MG tablet Take 40 mg by mouth daily.  . ranitidine (ZANTAC) 150 MG capsule Take 150 mg by mouth 2 (two) times daily.  . simvastatin (ZOCOR) 20 MG tablet Take by mouth.   No current facility-administered medications on file prior to visit.     ROS  Constitutional: Denies any fever or chills Gastrointestinal: No reported hemesis, hematochezia, vomiting, or acute GI distress Musculoskeletal: Denies any acute onset joint swelling, redness, loss of ROM, or weakness Neurological: No reported episodes of acute onset apraxia, aphasia, dysarthria, agnosia, amnesia, paralysis, loss of coordination, or loss of consciousness  Allergies  Ms. Bordeau is allergic to demerol [meperidine].  PFSH  Medical:  Ms. Fotopoulos  has a past medical history of Anxiety; Arthritis; Depression; Fibromyalgia; GERD (gastroesophageal reflux disease); History of carpal tunnel syndrome; History of diverticulitis; History of vertigo; Hypercholesteremia; Hypertension; Migraine; Neuralgia (2014); Obesity; and Polymyalgia rheumatica (HCC). Family: family history includes Arthritis in her father, mother, paternal grandfather, and sister; COPD in her brother and father; Cancer in her brother and son; Diabetes in her sister; Drug abuse in her son; Stroke in her maternal grandmother. Surgical:  has a past surgical history that includes Breast cyst aspiration (Right, +/- 10 yrs ago); Abdominal hysterectomy;  Hernia repair; Joint replacement (Right); and Thyroidectomy. Tobacco:  reports that she has quit smoking. She has never used smokeless tobacco. Alcohol:  reports that she does not drink alcohol. Drug:  reports that she does not use drugs.  Constitutional Exam  Vitals: Blood pressure (!) 171/93, pulse 87, temperature 97.9 F (36.6 C), temperature source Oral, resp. rate 18, height 5\' 1"  (1.549 m), weight 195 lb (88.5 kg), SpO2 98 %. General appearance: Well nourished, well developed, and well hydrated. In no acute distress Calculated BMI/Body habitus: Body mass index is 36.84 kg/m. (  35-39.9 kg/m2) Severe obesity (Class II) - 136% higher incidence of chronic pain. (The goal is to bring her body weight to less than 155 pounds). Psych/Mental status: Alert and oriented x 3 (person, place, & time) Eyes: PERLA Respiratory: No evidence of acute respiratory distress  Cervical Spine Exam  Inspection: No masses, redness, or swelling Alignment: Symmetrical Functional ROM: ROM appears unrestricted Stability: No instability detected Muscle strength & Tone: Functionally intact Sensory: Unimpaired Palpation: Non-contributory  Upper Extremity (UE) Exam    Side: Right upper extremity  Side: Left upper extremity  Inspection: No masses, redness, swelling, or asymmetry  Inspection: No masses, redness, swelling, or asymmetry  Functional ROM: ROM appears unrestricted  Functional ROM: ROM appears unrestricted  Muscle strength & Tone: Functionally intact  Muscle strength & Tone: Functionally intact  Sensory: Unimpaired  Sensory: Unimpaired  Palpation: Non-contributory  Palpation: Non-contributory   Thoracic Spine Exam  Inspection: No masses, redness, or swelling Alignment: Symmetrical Functional ROM: ROM appears unrestricted Stability: No instability detected Sensory: Unimpaired Muscle strength & Tone: Functionally intact Palpation: Non-contributory  Lumbar Spine Exam  Inspection: No masses,  redness, or swelling Alignment: Symmetrical Functional ROM: ROM appears unrestricted Stability: No instability detected Muscle strength & Tone: Functionally intact Sensory: Unimpaired Palpation: Non-contributory Provocative Tests: Lumbar Hyperextension and rotation test: evaluation deferred today       Patrick's Maneuver: evaluation deferred today              Gait & Posture Assessment  Ambulation: Unassisted Gait: Relatively normal for age and body habitus Posture: WNL   Lower Extremity Exam    Side: Right lower extremity  Side: Left lower extremity  Inspection: No masses, redness, swelling, or asymmetry  Inspection: No masses, redness, swelling, or asymmetry  Functional ROM: ROM appears unrestricted  Functional ROM: ROM appears unrestricted  Muscle strength & Tone: Functionally intact  Muscle strength & Tone: Functionally intact  Sensory: Unimpaired  Sensory: Unimpaired  Palpation: Non-contributory  Palpation: Non-contributory    Assessment & Plan  Primary Diagnosis & Pertinent Problem List: The primary encounter diagnosis was Chronic pain. 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  Visit Diagnosis: 1.  Chronic pain   2. Chronic knee pain (Location of Primary Source of Pain) (Bilateral) (L>R)   3. Chronic hip pain, unspecified laterality   4. Chronic low back pain (Location of Tertiary source of pain) (Bilateral) (L>R)   5. Fibromyalgia   6. History of TKR (total knee replacement) (Right)   7. Lumbar facet syndrome (Bilateral) (L>R)   8. Neurogenic pain   9. Long term current use of opiate analgesic   10. Opiate use   11. Encounter for pain management planning   12. Primary osteoarthritis involving multiple joints   13. Depression   14. Severe obesity (BMI 35.0-39.9) with comorbidity (HCC)   15. 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)  16. Chronic knee pain (S/P TKR) (Right)   17. Chronic knee pain (Left)   18. 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 Primary osteoarthritis of left knee   20. 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 LLEP: to ankle   Severe Obesity (Bmi 35.0-39.9) With Comorbidity (Hcc)    Problem-specific Plan(s): No problem-specific Assessment & Plan notes found for this encounter.  No new Assessment & Plan notes have been filed under this hospital service since the last note was generated. Service: Pain Management   Plan of Care   Problem List Items Addressed This Visit      High   Chronic hip pain (Location of Secondary source of pain) (Bilateral) (L>R) (Chronic)   Relevant Medications   meloxicam (MOBIC) 15 MG tablet   HYDROcodone-acetaminophen (NORCO/VICODIN) 5-325 MG tablet   Other Relevant Orders   Ambulatory referral to Physical Therapy   Chronic knee pain (Left) (Chronic)   Relevant Medications   meloxicam (MOBIC) 15  MG tablet   HYDROcodone-acetaminophen (NORCO/VICODIN) 5-325 MG tablet   Other Relevant Orders   KNEE INJECTION   Chronic knee pain (Location of Primary Source of Pain) (Bilateral) (L>R) (Chronic)   Relevant Medications   meloxicam (MOBIC) 15 MG tablet   HYDROcodone-acetaminophen (NORCO/VICODIN) 5-325 MG tablet   Other Relevant Orders   Ambulatory referral to Physical Therapy   Chronic knee pain (S/P TKR) (Right) (Chronic)   Relevant Orders   GENICULAR NERVE BLOCK   Chronic low back pain (Location of Tertiary source of pain) (Bilateral) (L>R) (Chronic)   Relevant Medications   meloxicam (MOBIC) 15 MG tablet   HYDROcodone-acetaminophen (NORCO/VICODIN) 5-325 MG tablet   Other Relevant Orders   Ambulatory referral to Physical Therapy   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) (Chronic)   Relevant Orders   Lumbar Epidural Injection   Chronic pain - Primary (Chronic)   Relevant Medications   meloxicam (MOBIC) 15 MG tablet   HYDROcodone-acetaminophen (NORCO/VICODIN) 5-325 MG tablet   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) (Right) (Chronic)   Relevant Orders   GENICULAR NERVE BLOCK   Chronic sacroiliac joint pain (Bilateral) (L>R) (Chronic)   Relevant Medications   meloxicam (MOBIC) 15 MG tablet   HYDROcodone-acetaminophen (NORCO/VICODIN) 5-325 MG tablet   Other Relevant Orders   SACROILIAC JOINT INJECTINS   Fibromyalgia (Chronic)   Relevant Medications   meloxicam (MOBIC) 15 MG tablet   History of TKR (total knee replacement) (Right)   Lumbar facet syndrome (Bilateral) (L>R) (Chronic)   Relevant Medications   meloxicam (MOBIC) 15 MG tablet   HYDROcodone-acetaminophen (NORCO/VICODIN) 5-325 MG tablet   Other Relevant Orders   LUMBAR FACET(MEDIAL BRANCH NERVE BLOCK) MBNB   Neurogenic pain (Chronic)   Osteoarthritis (arthritis due to wear and tear of joints)   Relevant Medications   meloxicam (MOBIC) 15 MG tablet   HYDROcodone-acetaminophen (NORCO/VICODIN)  5-325 MG tablet   Other Relevant Orders   KNEE INJECTION   Amb ref to Medical Nutrition Therapy-[facility]   Osteoarthritis of knee (Left) (Chronic)   Relevant Medications   meloxicam (MOBIC) 15 MG tablet   HYDROcodone-acetaminophen (NORCO/VICODIN) 5-325 MG tablet   Other Relevant Orders   KNEE INJECTION  Medium   Encounter for pain management planning   Long term current use of opiate analgesic (Chronic)   Opiate use (Chronic)   Severe obesity (BMI 35.0-39.9) with comorbidity (HCC) (Chronic)   Relevant Orders   Amb ref to Medical Nutrition Therapy-[facility]     Low   Depression   Relevant Orders   Ambulatory referral to Psychology    Other Visit Diagnoses   None.      Pharmacotherapy (Medications Ordered): Meds ordered this encounter  Medications  . meloxicam (MOBIC) 15 MG tablet    Sig: Take 1 tablet (15 mg total) by mouth daily.    Dispense:  30 tablet    Refill:  0    Do not place this medication, or any other prescription from our practice, on "Automatic Refill". Patient may have prescription filled one day early if pharmacy is closed on scheduled refill date.  Marland Kitchen. HYDROcodone-acetaminophen (NORCO/VICODIN) 5-325 MG tablet    Sig: Take 1 tablet by mouth every 8 (eight) hours as needed for severe pain.    Dispense:  60 tablet    Refill:  0    Do not add this medication to the electronic "Automatic Refill" notification system. Patient may have prescription filled one day early if pharmacy is closed on scheduled refill date. Do not fill until: 12/04/15 To last until: 01/03/16    [facility] Orders Placed This Encounter  Procedures  . GENICULAR NERVE BLOCK  . KNEE INJECTION  . LUMBAR FACET(MEDIAL BRANCH NERVE BLOCK) MBNB  . Lumbar Epidural Injection  . SACROILIAC JOINT INJECTINS  . Ambulatory referral to Physical Therapy  . Amb ref to Medical Nutrition Therapy-[facility]  . Ambulatory referral to Psychology    Imaging Ordered: AMB REFERRAL TO PHYSICAL  THERAPY AMB REFERRAL TO MEDICAL NUTRITION THERAPY ([facility]) AMB REFERRAL TO PSYCHOLOGY  Interventional Therapies: Scheduled:   Diagnostic right-sided genicular nerve block under fluoroscopic guidance and IV sedation + left sided intra-articular steroid injection of the knee.    Considering:  Diagnostic right knee genicular nerve block under fluoroscopic guidance, with or without sedation. Diagnostic left intra-articular knee injection with local anesthetic, without fluoroscopy or IV sedation. Possible series of 5 left intra-articular knee Hyalgan injections, without fluoroscopy or IV sedation. Possible diagnostic left genicular nerve block under fluoroscopic guidance, with or without sedation. Possible bilateral genicular nerve radiofrequency ablation under fluoroscopic guidance and IV sedation. Diagnostic bilateral lumbar facet block under fluoroscopic guidance and IV sedation. Possible bilateral lumbar facet radiofrequency ablation under fluoroscopic guidance and IV sedation the pending on the results of the diagnostic injection.  Diagnostic left sided L4-5 lumbar epidural steroid injection under fluoroscopic guidance, with or without sedation.    PRN Procedures:  Diagnostic right knee genicular nerve block under fluoroscopic guidance, with or without sedation. Diagnostic left intra-articular knee injection with local anesthetic, without fluoroscopy or IV sedation. Diagnostic bilateral lumbar facet block under fluoroscopic guidance and IV sedation. Diagnostic left sided L4-5 lumbar epidural steroid injection under fluoroscopic guidance, with or without sedation.    Referral(s) or Consult(s): None at this time.  New Prescriptions   HYDROCODONE-ACETAMINOPHEN (NORCO/VICODIN) 5-325 MG TABLET    Take 1 tablet by mouth every 8 (eight) hours as needed for severe pain.   MELOXICAM (MOBIC) 15 MG TABLET    Take 1 tablet (15 mg total) by mouth daily.    Medications administered during this  visit: [name] had no medications administered during this visit.  Requested PM Follow-up: Return in about 1 month (around  01/04/2016) for Med-Mgmt, In addition, Schedule Procedure.  Future Appointments Date Time Provider Department Center  12/14/2015 1:30 PM Delano Metz, MD [facility] None  01/03/2016 10:00 AM Delano Metz, MD [facility] None    Primary Care Physician: [facility] Location: [facility] Note by: Trelyn Vanderlinde A. Laban Emperor, M.D, DABA, DABAPM, DABPM, DABIPP, FIPP  Pain Score Disclaimer: We use the NRS-11 scale. This is a self-reported, subjective measurement of pain severity with only modest accuracy. It is used primarily to identify changes within a particular patient. It must be understood that outpatient pain scales are significantly less accurate that those used for research, where they can be applied under ideal controlled circumstances with minimal exposure to variables. In reality, the score is likely to be a combination of pain intensity and pain affect, where pain affect describes the degree of emotional arousal or changes in action readiness caused by the sensory experience of pain. Factors such as social and work situation, setting, emotional state, anxiety levels, expectation, and prior pain experience may influence pain perception and show large inter-individual differences that may also be affected by time variables.  Patient instructions provided during this appointment: Patient Instructions  You were given a prescription for Hydrocodone today. You are scheduled to return for a knee injection. Do not eat 8 hours before your appointment. You may have clear liquids with any meds you need to take up until 3 hours before your appt.

## 2015-12-07 ENCOUNTER — Telehealth: Payer: Self-pay | Admitting: Pain Medicine

## 2015-12-07 ENCOUNTER — Ambulatory Visit: Payer: Medicare Other | Admitting: Pain Medicine

## 2015-12-07 NOTE — Telephone Encounter (Signed)
[facility] [place] [phone] patient requesting refill on Vitamin D2

## 2015-12-07 NOTE — Telephone Encounter (Signed)
Dr Laban Emperornaveira will not refill per Eli PhillipsKori

## 2015-12-14 ENCOUNTER — Ambulatory Visit: Payer: Medicare Other | Admitting: Pain Medicine

## 2015-12-19 ENCOUNTER — Ambulatory Visit: Payer: Medicare Other | Attending: Pain Medicine | Admitting: Pain Medicine

## 2015-12-19 ENCOUNTER — Encounter: Payer: Self-pay | Admitting: Pain Medicine

## 2015-12-19 VITALS — BP 152/82 | HR 70 | Temp 96.8°F | Resp 16 | Ht 61.0 in | Wt 195.0 lb

## 2015-12-19 DIAGNOSIS — M4726 Other spondylosis with radiculopathy, lumbar region: Secondary | ICD-10-CM | POA: Insufficient documentation

## 2015-12-19 DIAGNOSIS — G43909 Migraine, unspecified, not intractable, without status migrainosus: Secondary | ICD-10-CM | POA: Diagnosis not present

## 2015-12-19 DIAGNOSIS — E785 Hyperlipidemia, unspecified: Secondary | ICD-10-CM | POA: Insufficient documentation

## 2015-12-19 DIAGNOSIS — G8929 Other chronic pain: Secondary | ICD-10-CM | POA: Diagnosis not present

## 2015-12-19 DIAGNOSIS — T8484XS Pain due to internal orthopedic prosthetic devices, implants and grafts, sequela: Secondary | ICD-10-CM

## 2015-12-19 DIAGNOSIS — M792 Neuralgia and neuritis, unspecified: Secondary | ICD-10-CM | POA: Insufficient documentation

## 2015-12-19 DIAGNOSIS — K5790 Diverticulosis of intestine, part unspecified, without perforation or abscess without bleeding: Secondary | ICD-10-CM | POA: Insufficient documentation

## 2015-12-19 DIAGNOSIS — M160 Bilateral primary osteoarthritis of hip: Secondary | ICD-10-CM | POA: Insufficient documentation

## 2015-12-19 DIAGNOSIS — M25562 Pain in left knee: Secondary | ICD-10-CM

## 2015-12-19 DIAGNOSIS — K219 Gastro-esophageal reflux disease without esophagitis: Secondary | ICD-10-CM | POA: Diagnosis not present

## 2015-12-19 DIAGNOSIS — M79605 Pain in left leg: Secondary | ICD-10-CM | POA: Insufficient documentation

## 2015-12-19 DIAGNOSIS — M25551 Pain in right hip: Secondary | ICD-10-CM | POA: Insufficient documentation

## 2015-12-19 DIAGNOSIS — G5600 Carpal tunnel syndrome, unspecified upper limb: Secondary | ICD-10-CM | POA: Insufficient documentation

## 2015-12-19 DIAGNOSIS — Z791 Long term (current) use of non-steroidal anti-inflammatories (NSAID): Secondary | ICD-10-CM | POA: Diagnosis not present

## 2015-12-19 DIAGNOSIS — M545 Low back pain: Secondary | ICD-10-CM | POA: Insufficient documentation

## 2015-12-19 DIAGNOSIS — F329 Major depressive disorder, single episode, unspecified: Secondary | ICD-10-CM | POA: Diagnosis not present

## 2015-12-19 DIAGNOSIS — E039 Hypothyroidism, unspecified: Secondary | ICD-10-CM | POA: Insufficient documentation

## 2015-12-19 DIAGNOSIS — M1712 Unilateral primary osteoarthritis, left knee: Secondary | ICD-10-CM | POA: Insufficient documentation

## 2015-12-19 DIAGNOSIS — Z6839 Body mass index (BMI) 39.0-39.9, adult: Secondary | ICD-10-CM | POA: Insufficient documentation

## 2015-12-19 DIAGNOSIS — R200 Anesthesia of skin: Secondary | ICD-10-CM | POA: Insufficient documentation

## 2015-12-19 DIAGNOSIS — Z96651 Presence of right artificial knee joint: Secondary | ICD-10-CM | POA: Insufficient documentation

## 2015-12-19 DIAGNOSIS — M542 Cervicalgia: Secondary | ICD-10-CM | POA: Insufficient documentation

## 2015-12-19 DIAGNOSIS — M25561 Pain in right knee: Secondary | ICD-10-CM

## 2015-12-19 DIAGNOSIS — M533 Sacrococcygeal disorders, not elsewhere classified: Secondary | ICD-10-CM | POA: Diagnosis not present

## 2015-12-19 DIAGNOSIS — M797 Fibromyalgia: Secondary | ICD-10-CM | POA: Insufficient documentation

## 2015-12-19 DIAGNOSIS — I10 Essential (primary) hypertension: Secondary | ICD-10-CM | POA: Diagnosis not present

## 2015-12-19 DIAGNOSIS — M47812 Spondylosis without myelopathy or radiculopathy, cervical region: Secondary | ICD-10-CM | POA: Insufficient documentation

## 2015-12-19 DIAGNOSIS — M79604 Pain in right leg: Secondary | ICD-10-CM | POA: Insufficient documentation

## 2015-12-19 DIAGNOSIS — M25552 Pain in left hip: Secondary | ICD-10-CM | POA: Insufficient documentation

## 2015-12-19 DIAGNOSIS — F419 Anxiety disorder, unspecified: Secondary | ICD-10-CM | POA: Diagnosis not present

## 2015-12-19 MED ORDER — MIDAZOLAM HCL 5 MG/5ML IJ SOLN
1.0000 mg | INTRAMUSCULAR | Status: DC | PRN
  Filled 2015-12-19: qty 5

## 2015-12-19 MED ORDER — ROPIVACAINE HCL 2 MG/ML IJ SOLN
9.0000 mL | Freq: Once | INTRAMUSCULAR | Status: AC
  Administered 2015-12-19: 9 mL
  Filled 2015-12-19: qty 10

## 2015-12-19 MED ORDER — LACTATED RINGERS IV SOLN
1000.0000 mL | Freq: Once | INTRAVENOUS | Status: AC
  Administered 2015-12-19: 1000 mL via INTRAVENOUS

## 2015-12-19 MED ORDER — FENTANYL CITRATE (PF) 100 MCG/2ML IJ SOLN
25.0000 ug | INTRAMUSCULAR | Status: DC | PRN
  Filled 2015-12-19: qty 2

## 2015-12-19 MED ORDER — METHYLPREDNISOLONE ACETATE 40 MG/ML IJ SUSP
40.0000 mg | Freq: Once | INTRAMUSCULAR | Status: AC
  Administered 2015-12-19: 40 mg
  Filled 2015-12-19: qty 1

## 2015-12-19 MED ORDER — LIDOCAINE HCL (PF) 1 % IJ SOLN
10.0000 mL | Freq: Once | INTRAMUSCULAR | Status: AC
  Administered 2015-12-19: 10 mL

## 2015-12-19 MED ORDER — METHYLPREDNISOLONE ACETATE 80 MG/ML IJ SUSP
80.0000 mg | Freq: Once | INTRAMUSCULAR | Status: AC
  Administered 2015-12-19: 80 mg
  Filled 2015-12-19: qty 1

## 2015-12-19 NOTE — Progress Notes (Signed)
Patient's Name: Alice [name]  Patient type: Established  MRN: [id number]  Service setting: Ambulatory outpatient  DOB: [birth year]  Location: [facility]  DOS: 12/19/2015  Primary Care Physician: [place]  Note by: Beatriz Chancellor A. Dossie Arbour, M.D, DABA, DABAPM, DABPM, DABIPP, FIPP  Referring Physician: [facility]  Specialty: Board-Certified Interventional Pain Management  Last Visit to Pain Management: 12/07/2015   Primary Reason(s) for Visit: Interventional Pain Management Treatment. CC: Knee Pain (bilateral, left knee is worse)  Bilateral chronic knee pain [M25.561, M25.562, G89.29]   Procedure #1:  Anesthesia, Analgesia, Anxiolysis:  Type: Diagnostic Intra-Articular Knee Injection Region: Lateral  Knee Region Level: Knee Joint Laterality: Left  Indications: 1. Chronic knee pain (Location of Primary Source of Pain) (Bilateral) (L>R)   2. Primary osteoarthritis of left knee   3. Chronic knee pain (Left)     Type: Local Anesthesia Local Anesthetic: Lidocaine 1% Route: Infiltration (Junction/IM) IV Access: Secured Sedation: Meaningful verbal contact was maintained at all times during the procedure  Indication(s): Analgesia      Procedure #2:  Anesthesia, Analgesia, Anxiolysis:  Type: Diagnostic Superior-lateral, Superior-medial, and Inferior-medial, Genicular Nerves Block. (CPT [phone]) Region: Lateral, Anterior, and Medial aspects of the knee joint, above and below the knee joint proper. Level: Superior and inferior to the knee joint. Laterality: Right  Indications: 1. Chronic knee pain (Location of Primary Source of Pain) (Bilateral) (L>R)   2. Chronic knee pain (S/P TKR) (Right)   3. 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 year old, female patient, seen today for interventional treatment. She has Anxiety; Carpal tunnel syndrome; Depression; Fibromyalgia; GERD (gastroesophageal reflux disease); History of dysphagia; Hypothyroidism; Hyperlipidemia; Hypertension; Migraine headache; Neuralgia; No transfusions per religious beliefs; Osteoarthritis (arthritis due to wear and tear of joints); Chronic pain; Chronic low back pain (Location of Tertiary source of pain) (Bilateral) (L>R); Chronic lower extremity pain (Bilateral) (L>R); Chronic knee pain (Location of Primary Source of Pain) (Bilateral) (L>R); Chronic hip pain (Location of Secondary source of pain) (Bilateral) (L>R); 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); Lumbar facet syndrome (Bilateral) (L>R); Chronic sacroiliac joint pain (Bilateral) (L>R); Long term current use of opiate analgesic; Long term prescription opiate use; Opiate use; Long term current use of non-steroidal anti-inflammatories (NSAID); Disturbance of skin sensation; Diverticulosis; Neurogenic pain; 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 Her primarily concern today is the Knee Pain (bilateral, left knee is worse)   Pain Type: Chronic pain Pain Location: Knee Pain Orientation: Right, Left Pain Descriptors / Indicators: Aching, Sharp Pain Frequency: Constant  Date of Last Visit:  12/04/15 Service Provided on Last Visit: Evaluation (new patient)  Coagulation Parameters Lab Results  Component Value Date   INR 1.0 10/21/2013   LABPROT 13.5 10/21/2013   APTT 34.8 10/21/2013   PLT 202 10/13/2015  Verification of the correct person, correct site (including marking of site), and correct procedure were performed and confirmed by the patient.  Consent: Secured. Under the influence of no sedatives a written informed consent was obtained, after having provided information on the risks and possible complications. To fulfill our ethical and legal obligations, as recommended by the American Medical Association's Code of Ethics, we have provided information to the patient about our clinical impression; the nature and purpose of the treatment or procedure; the risks, benefits, and possible complications of the intervention; alternatives; the risk(s) and benefit(s) of the alternative treatment(s) or procedure(s); and the risk(s) and benefit(s) of doing nothing. The patient was provided information about the risks and possible complications associated with the procedure. These include, but are not limited to, failure to achieve desired goals, infection, bleeding, organ or nerve damage, allergic reactions, paralysis, and death. In the case of intra- or periarticular procedures these may include, but are not limited to, failure to achieve desired goals, infection, bleeding (hemarthrosis), organ or nerve damage, allergic reactions, and death. In addition, the patient was informed that Medicine is not an exact science; therefore, there is also the possibility of unforeseen risks and possible complications that may result in a catastrophic outcome. The patient indicated having understood very clearly. We have given the patient no guarantees and we have made no promises. Enough time was given to the patient to ask questions, all of which were answered to the patient's satisfaction.  Consent  Attestation: I, the ordering provider, attest that I have discussed with the patient the benefits, risks, side-effects, alternatives, likelihood of achieving goals, and potential problems during recovery for the procedure that I have provided informed consent.  Pre-Procedure Preparation: Safety Precautions: Allergies reviewed. Appropriate site, procedure, and patient were confirmed by following the Joint Commission's Universal Protocol (UP.01.01.01), in the form of a "Time Out". The patient was asked to confirm marked site and procedure, before commencing. The patient was asked about blood thinners, or active infections, both of which were denied. Patient was assessed for positional comfort and all pressure points were checked before starting procedure. Allergies: She is allergic to demerol [meperidine].. Infection Control Precautions: Sterile technique used. Standard Universal Precautions were taken as recommended by the Department of [facility] for Disease Control and Prevention (CDC). Standard pre-surgical skin prep was conducted. Respiratory hygiene and cough etiquette was practiced. Hand hygiene observed. Safe injection practices and needle disposal techniques followed. SDV (single dose vial) medications used. Medications properly checked for expiration dates and contaminants. Personal protective equipment (PPE) used: Sterile Radiation-resistant gloves. Monitoring:  As per clinic protocol. Vitals:   12/19/15 1334 12/19/15 1342 12/19/15 1352 12/19/15 1402  BP: (!) 165/87 (!) 155/70  (!) 152/82  Pulse: 70 68 66 70  Resp: _0 Temp:  (!) 96.8 F (36 C)    SpO2: 96% 95% 95% 96%  Weight:      Height:      Calculated BMI: Body mass index is 36.84 kg/m.  Description of Procedure Process #1:   Time-out: "Time-out" completed before starting procedure, as per protocol. Position: Sitting Target Area: Knee Joint Approach: Lateral approach. Area Prepped: Entire knee area, from  the mid-thigh to the mid-shin. Prepping solution: ChloraPrep (2% chlorhexidine gluconate and 70% isopropyl alcohol) Safety Precautions: Aspiration looking for blood return was conducted prior to all injections. At no point did we inject any substances, as a needle was being advanced. No attempts were made at seeking any paresthesias. Safe  injection practices and needle disposal techniques used. Medications properly checked for expiration dates. SDV (single dose vial) medications used.    Description of the Procedure: Protocol guidelines were followed. The patient was placed in position over the fluoroscopy table. The target area was identified and the area prepped in the usual manner. Skin desensitized using vapocoolant spray. Skin & deeper tissues infiltrated with local anesthetic. Appropriate amount of time allowed to pass for local anesthetics to take effect. The procedure needles were then advanced to the target area. Proper needle placement secured. Negative aspiration confirmed. Solution injected in intermittent fashion, asking for systemic symptoms every 0.5cc of injectate. The needles were then removed and the area cleansed, making sure to leave some of the prepping solution back to take advantage of its long term bactericidal properties. EBL: None Materials & Medications Used:  Needle(s) Used: 22g - 1.5" Needle(s) Medications Administered today: We administered lactated ringers, methylPREDNISolone acetate, lidocaine (PF), ropivacaine (PF) 2 mg/ml (0.2%), methylPREDNISolone acetate, and ropivacaine (PF) 2 mg/ml (0.2%).Please see chart orders for dosing details.  Description of Procedure Process #2:  Time-out: "Time-out" completed before starting procedure, as per protocol. Position: Supine Target Area: For Genicular Nerve block(s), the targets are: the superior-lateral genicular nerve, located in the lateral distal portion of the femoral shaft as it curves to form the lateral epicondyle, in the  region of the distal femoral metaphysis; the superior-medial genicular nerve, located in the medial distal portion of the femoral shaft as it curves to form the medial epicondyle; and the inferior-medial genicular nerve, located in the medial, proximal portion of the tibial shaft, as it curves to form the medial epicondyle, in the region of the proximal tibial metaphysis. Approach: Anterior, ipsilateral approach. Area Prepped: Entire knee area, from mid-thigh to mid-shin, lateral, anterior, and medial aspects. Prepping solution: ChloraPrep (2% chlorhexidine gluconate and 70% isopropyl alcohol) Safety Precautions: Aspiration looking for blood return was conducted prior to all injections. At no point did we inject any substances, as a needle was being advanced. No attempts were made at seeking any paresthesias. Safe injection practices and needle disposal techniques used. Medications properly checked for expiration dates. SDV (single dose vial) medications used.   Description of the Procedure: Protocol guidelines were followed. The patient was placed in position over the procedure table. The target area was identified and the area prepped in the usual manner. Skin desensitized using vapocoolant spray. Skin & deeper tissues infiltrated with local anesthetic. Appropriate amount of time allowed to pass for local anesthetics to take effect. The procedure needles were then advanced to the target area. Proper needle placement secured. Negative aspiration confirmed. Solution injected in intermittent fashion, asking for systemic symptoms every 0.5cc of injectate. The needles were then removed and the area cleansed, making sure to leave some of the prepping solution back to take advantage of its long term bactericidal properties. EBL: None Materials & Medications Used:  Needle(s) Used: 22g - 3.5" Spinal Needle(s) Solution Injected: 0.2% PF-Ropivacaine (66m) + SDV-DepoMedrol 80 mg/ml (120m, (3 mL/level)  Imaging  Guidance for procedure #1:   Type of Imaging Technique: None  Imaging Guidance for procedure #2:  Type of Imaging Technique: Fluoroscopy Guidance (Non-spinal) Indication(s): Assistance in needle guidance and placement for procedures requiring needle placement in or near specific anatomical locations not easily accessible without such assistance. Exposure Time: Please see nurses notes. Contrast: None required. Fluoroscopic Guidance: I was personally present in the fluoroscopy suite, where the patient was placed in position for the procedure, over the  fluoroscopy-compatible table. Fluoroscopy was manipulated, using "Tunnel Vision Technique", to obtain the best possible view of the target area, on the affected side. Parallax error was corrected before commencing the procedure. A "direction-depth-direction" technique was used to introduce the needle under continuous pulsed fluoroscopic guidance. Once the target was reached, antero-posterior, oblique, and lateral fluoroscopic projection views were taken to confirm needle placement in all planes. Permanently recorded images stored by scanning into EMR. Interpretation: No contrast injected. Intraoperative imaging interpretation by performing Physician. Adequate needle placement confirmed. Needle placement confirmed in AP, lateral, & Oblique Views. Permanent hardcopy images in multiple planes scanned into the patient's record.  Antibiotic Prophylaxis:  Indication(s): No indications identified. Type:  Antibiotics Given (last 72 hours)    None       Post-operative Assessment:   Complications: No immediate post-treatment complications were observed. Disposition: Return to clinic for follow-up evaluation. The patient tolerated the entire procedure well. A repeat set of vitals were taken after the procedure and the patient was kept under observation following institutional policy, for this type of procedure. The patient was discharged home, once  institutional criteria were met. The patient was provided with post-procedure discharge instructions, including a section on how to identify potential problems. Should any problems arise concerning this procedure, the patient was given instructions to immediately contact us, at any time, without hesitation. In any case, we plan to contact the patient by telephone for a follow-up status report regarding this interventional procedure. Comments:  No additional relevant information.  Plan of Care   Problem List Items Addressed This Visit      High   Chronic knee pain (Left) (Chronic)   Relevant Medications   PARoxetine (PAXIL) 40 MG tablet   fentaNYL (SUBLIMAZE) injection 25-50 mcg   methylPREDNISolone acetate (DEPO-MEDROL) injection 80 mg (Completed)   lidocaine (PF) (XYLOCAINE) 1 % injection 10 mL (Completed)   ropivacaine (PF) 2 mg/ml (0.2%) (NAROPIN) epidural 9 mL (Completed)   methylPREDNISolone acetate (DEPO-MEDROL) injection 40 mg (Completed)   ropivacaine (PF) 2 mg/ml (0.2%) (NAROPIN) epidural 9 mL (Completed)   Other Relevant Orders   KNEE INJECTION   Chronic knee pain (Location of Primary Source of Pain) (Bilateral) (L>R) - Primary (Chronic)   Relevant Medications   PARoxetine (PAXIL) 40 MG tablet   fentaNYL (SUBLIMAZE) injection 25-50 mcg   lactated ringers infusion 1,000 mL (Completed)   midazolam (VERSED) 5 MG/5ML injection 1-2 mg   methylPREDNISolone acetate (DEPO-MEDROL) injection 80 mg (Completed)   lidocaine (PF) (XYLOCAINE) 1 % injection 10 mL (Completed)   ropivacaine (PF) 2 mg/ml (0.2%) (NAROPIN) epidural 9 mL (Completed)   methylPREDNISolone acetate (DEPO-MEDROL) injection 40 mg (Completed)   ropivacaine (PF) 2 mg/ml (0.2%) (NAROPIN) epidural 9 mL (Completed)   Other Relevant Orders   GENICULAR NERVE BLOCK   KNEE INJECTION   Chronic knee pain (S/P TKR) (Right) (Chronic)   Relevant Orders   GENICULAR NERVE BLOCK   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    Requested PM Follow-up: Return in about 2 weeks (around 01/02/2016) for Post-Procedure evaluation.  Future Appointments Date Time Provider [place]  01/03/2016 10:00  AM Milinda Pointer, MD [facility] None    Primary Care Physician: [facility] Location: [facility] Note by: Tyreik Delahoussaye A. Dossie Arbour, M.D, DABA, DABAPM, DABPM, DABIPP, FIPP  Disclaimer:  Medicine is not an exact science. The only guarantee in medicine is that nothing is guaranteed. It is important to note that the decision to proceed with this intervention was based on the information collected from the patient. The Data and conclusions were drawn from the patient's questionnaire, the interview, and the physical examination. Because the information was provided in large part by the patient, it cannot be guaranteed that it has not been purposely or unconsciously manipulated. Every effort has been made to obtain as much relevant data as possible for this evaluation. It is important to note that the conclusions that lead to this procedure are derived in large part from the available data. Always take into account that the treatment will also be dependent on availability of resources and existing treatment guidelines, considered by other Pain Management Practitioners as being common knowledge and practice, at the time of the intervention. For Medico-Legal purposes, it is also important to point out that variation in procedural techniques and pharmacological choices are the acceptable norm. The indications,  contraindications, technique, and results of the above procedure should only be interpreted and judged by a Board-Certified Interventional Pain Specialist with extensive familiarity and expertise in the same exact procedure and technique. Attempts at providing opinions without similar or greater experience and expertise than that of the treating physician will be considered as inappropriate and unethical, and shall result in a formal complaint to the state medical board and applicable specialty societies.

## 2015-12-19 NOTE — Patient Instructions (Signed)
Pain Management Discharge Instructions  General Discharge Instructions :  If you need to reach your doctor call: Monday-Friday 8:00 am - 4:00 pm at [phone] or toll free [phone].  After clinic hours [phone] to have operator reach doctor.  Bring all of your medication bottles to all your appointments in the pain clinic.  To cancel or reschedule your appointment with Pain Management please remember to call 24 hours in advance to avoid a fee.  Refer to the educational materials which you have been given on: General Risks, I had my Procedure. Discharge Instructions, Post Sedation.  Post Procedure Instructions:  The drugs you were given will stay in your system until tomorrow, so for the next 24 hours you should not drive, make any legal decisions or drink any alcoholic beverages.  You may eat anything you prefer, but it is better to start with liquids then soups and crackers, and gradually work up to solid foods.  Please notify your doctor immediately if you have any unusual bleeding, trouble breathing or pain that is not related to your normal pain.  Depending on the type of procedure that was done, some parts of your body may feel week and/or numb.  This usually clears up by tonight or the next day.  Walk with the use of an assistive device or accompanied by an adult for the 24 hours.  You may use ice on the affected area for the first 24 hours.  Put ice in a Ziploc bag and cover with a towel and place against area 15 minutes on 15 minutes off.  You may switch to heat after 24 hours.Knee Injection A knee injection is a procedure to get medicine into your knee joint. Your health care provider puts a needle into the joint and injects medicine with an attached syringe. The injected medicine may relieve the pain, swelling, and stiffness of arthritis. The injected medicine may also help to lubricate and cushion your knee joint. You may need more than one injection. LET YOUR HEALTH  CARE PROVIDER KNOW ABOUT:  Any allergies you have.  All medicines you are taking, including vitamins, herbs, eye drops, creams, and over-the-counter medicines.  Previous problems you or members of your family have had with the use of anesthetics.  Any blood disorders you have.  Previous surgeries you have had.  Any medical conditions you may have. RISKS AND COMPLICATIONS Generally, this is a safe procedure. However, problems may occur, including:  Infection.  Bleeding.  Worsening symptoms.  Damage to the area around your knee.  Allergic reaction to any of the medicines.  Skin reactions from repeated injections. BEFORE THE PROCEDURE  Ask your health care provider about changing or stopping your regular medicines. This is especially important if you are taking diabetes medicines or blood thinners.  Plan to have someone take you home after the procedure. PROCEDURE  You will sit or lie down in a position for your knee to be treated.  The skin over your kneecap will be cleaned with a germ-killing solution (antiseptic).  You will be given a medicine that numbs the area (local anesthetic). You may feel some stinging.  After your knee becomes numb, you will have a second injection. This is the medicine. This needle is carefully placed between your kneecap and your knee. The medicine is injected into the joint space.  At the end of the procedure, the needle will be removed.  A bandage (dressing) may be placed over the injection site. The procedure may vary among   health care providers and hospitals. AFTER THE PROCEDURE  You may have to move your knee through its full range of motion. This helps to get all of the medicine into your joint space.  Your blood pressure, heart rate, breathing rate, and blood oxygen level will be monitored often until the medicines you were given have worn off.  You will be watched to make sure that you do not have a reaction to the injected  medicine.   This information is not intended to replace advice given to you by your health care provider. Make sure you discuss any questions you have with your health care provider.   Document Released: 06/23/2006 Document Revised: 04/22/2014 Document Reviewed: 02/09/2014 Elsevier Interactive Patient Education 2016 [employer] GENERAL RISKS AND COMPLICATIONS  What are the risk, side effects and possible complications? Generally speaking, most procedures are safe.  However, with any procedure there are risks, side effects, and the possibility of complications.  The risks and complications are dependent upon the sites that are lesioned, or the type of nerve block to be performed.  The closer the procedure is to the spine, the more serious the risks are.  Great care is taken when placing the radio frequency needles, block needles or lesioning probes, but sometimes complications can occur. 1. Infection: Any time there is an injection through the skin, there is a risk of infection.  This is why sterile conditions are used for these blocks.  There are four possible types of infection. 1. Localized skin infection. 2. Central Nervous System Infection-This can be in the form of Meningitis, which can be deadly. 3. Epidural Infections-This can be in the form of an epidural abscess, which can cause pressure inside of the spine, causing compression of the spinal cord with subsequent paralysis. This would require an emergency surgery to decompress, and there are no guarantees that the patient would recover from the paralysis. 4. Discitis-This is an infection of the intervertebral discs.  It occurs in about 1% of discography procedures.  It is difficult to treat and it may lead to surgery.        2. Pain: the needles have to go through skin and soft tissues, will cause soreness.       3. Damage to internal structures:  The nerves to be lesioned may be near blood vessels or    other nerves which can be  potentially damaged.       4. Bleeding: Bleeding is more common if the patient is taking blood thinners such as  aspirin, Coumadin, Ticiid, Plavix, etc., or if he/she have some genetic predisposition  such as hemophilia. Bleeding into the spinal canal can cause compression of the spinal  cord with subsequent paralysis.  This would require an emergency surgery to  decompress and there are no guarantees that the patient would recover from the  paralysis.       5. Pneumothorax:  Puncturing of a lung is a possibility, every time a needle is introduced in  the area of the chest or upper back.  Pneumothorax refers to free air around the  collapsed lung(s), inside of the thoracic cavity (chest cavity).  Another two possible  complications related to a similar event would include: Hemothorax and Chylothorax.   These are variations of the Pneumothorax, where instead of air around the collapsed  lung(s), you may have blood or chyle, respectively.       6. Spinal headaches: They may occur with any procedures in the area of   the spine.       7. Persistent CSF (Cerebro-Spinal Fluid) leakage: This is a rare problem, but may occur  with prolonged intrathecal or epidural catheters either due to the formation of a fistulous  track or a dural tear.       8. Nerve damage: By working so close to the spinal cord, there is always a possibility of  nerve damage, which could be as serious as a permanent spinal cord injury with  paralysis.       9. Death:  Although rare, severe deadly allergic reactions known as "Anaphylactic  reaction" can occur to any of the medications used.      10. Worsening of the symptoms:  We can always make thing worse.  What are the chances of something like this happening? Chances of any of this occuring are extremely low.  By statistics, you have more of a chance of getting killed in a motor vehicle accident: while driving to the hospital than any of the above occurring .  Nevertheless, you should be  aware that they are possibilities.  In general, it is similar to taking a shower.  Everybody knows that you can slip, hit your head and get killed.  Does that mean that you should not shower again?  Nevertheless always keep in mind that statistics do not mean anything if you happen to be on the wrong side of them.  Even if a procedure has a 1 (one) in a 1,000,000 (million) chance of going wrong, it you happen to be that one..Also, keep in mind that by statistics, you have more of a chance of having something go wrong when taking medications.  Who should not have this procedure? If you are on a blood thinning medication (e.g. Coumadin, Plavix, see list of "Blood Thinners"), or if you have an active infection going on, you should not have the procedure.  If you are taking any blood thinners, please inform your physician.  How should I prepare for this procedure?  Do not eat or drink anything at least six hours prior to the procedure.  Bring a driver with you .  It cannot be a taxi.  Come accompanied by an adult that can drive you back, and that is strong enough to help you if your legs get weak or numb from the local anesthetic.  Take all of your medicines the morning of the procedure with just enough water to swallow them.  If you have diabetes, make sure that you are scheduled to have your procedure done first thing in the morning, whenever possible.  If you have diabetes, take only half of your insulin dose and notify our nurse that you have done so as soon as you arrive at the clinic.  If you are diabetic, but only take blood sugar pills (oral hypoglycemic), then do not take them on the morning of your procedure.  You may take them after you have had the procedure.  Do not take aspirin or any aspirin-containing medications, at least eleven (11) days prior to the procedure.  They may prolong bleeding.  Wear loose fitting clothing that may be easy to take off and that you would not mind if it  got stained with Betadine or blood.  Do not wear any jewelry or perfume  Remove any nail coloring.  It will interfere with some of our monitoring equipment.  NOTE: Remember that this is not meant to be interpreted as a complete list of all possible complications.    Unforeseen problems may occur.  BLOOD THINNERS The following drugs contain aspirin or other products, which can cause increased bleeding during surgery and should not be taken for 2 weeks prior to and 1 week after surgery.  If you should need take something for relief of minor pain, you may take acetaminophen which is found in Tylenol,m Datril, Anacin-3 and Panadol. It is not blood thinner. The products listed below are.  Do not take any of the products listed below in addition to any listed on your instruction sheet.  A.P.C or A.P.C with Codeine Codeine Phosphate Capsules #3 Ibuprofen Ridaura  ABC compound Congesprin Imuran rimadil  Advil Cope Indocin Robaxisal  Alka-Seltzer Effervescent Pain Reliever and Antacid Coricidin or Coricidin-D  Indomethacin Rufen  Alka-Seltzer plus Cold Medicine Cosprin Ketoprofen S-A-C Tablets  Anacin Analgesic Tablets or Capsules Coumadin Korlgesic Salflex  Anacin Extra Strength Analgesic tablets or capsules CP-2 Tablets Lanoril Salicylate  Anaprox Cuprimine Capsules Levenox Salocol  Anexsia-D Dalteparin Magan Salsalate  Anodynos Darvon compound Magnesium Salicylate Sine-off  Ansaid Dasin Capsules Magsal Sodium Salicylate  Anturane Depen Capsules Marnal Soma  APF Arthritis pain formula Dewitt's Pills Measurin Stanback  Argesic Dia-Gesic Meclofenamic Sulfinpyrazone  Arthritis Bayer Timed Release Aspirin Diclofenac Meclomen Sulindac  Arthritis pain formula Anacin Dicumarol Medipren Supac  Analgesic (Safety coated) Arthralgen Diffunasal Mefanamic Suprofen  Arthritis Strength Bufferin Dihydrocodeine Mepro Compound Suprol  Arthropan liquid Dopirydamole Methcarbomol with Aspirin Synalgos  ASA  tablets/Enseals Disalcid Micrainin Tagament  Ascriptin Doan's Midol Talwin  Ascriptin A/D Dolene Mobidin Tanderil  Ascriptin Extra Strength Dolobid Moblgesic Ticlid  Ascriptin with Codeine Doloprin or Doloprin with Codeine Momentum Tolectin  Asperbuf Duoprin Mono-gesic Trendar  Aspergum Duradyne Motrin or Motrin IB Triminicin  Aspirin plain, buffered or enteric coated Durasal Myochrisine Trigesic  Aspirin Suppositories Easprin Nalfon Trillsate  Aspirin with Codeine Ecotrin Regular or Extra Strength Naprosyn Uracel  Atromid-S Efficin Naproxen Ursinus  Auranofin Capsules Elmiron Neocylate Vanquish  Axotal Emagrin Norgesic Verin  Azathioprine Empirin or Empirin with Codeine Normiflo Vitamin E  Azolid Emprazil Nuprin Voltaren  Bayer Aspirin plain, buffered or children's or timed BC Tablets or powders Encaprin Orgaran Warfarin Sodium  Buff-a-Comp Enoxaparin Orudis Zorpin  Buff-a-Comp with Codeine Equegesic Os-Cal-Gesic   Buffaprin Excedrin plain, buffered or Extra Strength Oxalid   Bufferin Arthritis Strength Feldene Oxphenbutazone   Bufferin plain or Extra Strength Feldene Capsules Oxycodone with Aspirin   Bufferin with Codeine Fenoprofen Fenoprofen Pabalate or Pabalate-SF   Buffets II Flogesic Panagesic   Buffinol plain or Extra Strength Florinal or Florinal with Codeine Panwarfarin   Buf-Tabs Flurbiprofen Penicillamine   Butalbital Compound Four-way cold tablets Penicillin   Butazolidin Fragmin Pepto-Bismol   Carbenicillin Geminisyn Percodan   Carna Arthritis Reliever Geopen Persantine   Carprofen Gold's salt Persistin   Chloramphenicol Goody's Phenylbutazone   Chloromycetin Haltrain Piroxlcam   Clmetidine heparin Plaquenil   Cllnoril Hyco-pap Ponstel   Clofibrate Hydroxy chloroquine Propoxyphen         Before stopping any of these medications, be sure to consult the physician who ordered them.  Some, such as Coumadin (Warfarin) are ordered to prevent or treat serious conditions  such as "deep thrombosis", "pumonary embolisms", and other heart problems.  The amount of time that you may need off of the medication may also vary with the medication and the reason for which you were taking it.  If you are taking any of these medications, please make sure you notify your pain physician before you undergo any procedures.          

## 2015-12-20 ENCOUNTER — Telehealth: Payer: Self-pay | Admitting: *Deleted

## 2015-12-20 NOTE — Telephone Encounter (Signed)
No problems post procedure. 

## 2015-12-23 ENCOUNTER — Ambulatory Visit
Admission: EM | Admit: 2015-12-23 | Discharge: 2015-12-23 | Disposition: A | Discharge status: Home / Self Care | Payer: Medicare Other | Attending: Emergency Medicine | Admitting: Emergency Medicine

## 2015-12-23 ENCOUNTER — Other Ambulatory Visit: Payer: Self-pay

## 2015-12-23 ENCOUNTER — Encounter: Payer: Self-pay | Admitting: Emergency Medicine

## 2015-12-23 DIAGNOSIS — I10 Essential (primary) hypertension: Secondary | ICD-10-CM

## 2015-12-23 NOTE — ED Triage Notes (Signed)
Patient c/o elevated blood pressure for the past 3 days.

## 2015-12-23 NOTE — ED Provider Notes (Signed)
CSN: [id number]     Arrival date & time 12/23/15  1152 History   First MD Initiated Contact with Patient 12/23/15 1214     Chief Complaint  Patient presents with  . Hypertension   (Consider location/radiation/quality/duration/timing/severity/associated sxs/prior Treatment) HPI  75 year old female presents with complaints of elevated blood pressure for the past 3 days. The patient relates that she has had labile hypertension for a number of years. She has been hospitalized in the past but doesn't state that she was placed on any antihypertensive medications. Her blood pressures the last 3 days has been elevated and she's been checking her blood pressures about 3 times daily. She contacted her primary care and because of her elevated pressures was told to come to urgent care for evaluation. He states that she typically will be dizzy in the mornings and has a swishing sound in her ear  when her pressures are elevated. Denies any neurological symptoms or signs. She appears very comfortable at rest in our examining room. Blood pressure on presentation was 167/111. She did complain of some back pain and EKG was obtained. He has numerous comorbidities which were reviewed.     Past Medical History:  Diagnosis Date  . Anxiety   . Arthritis   . Depression   . Fibromyalgia   . GERD (gastroesophageal reflux disease)   . History of carpal tunnel syndrome   . History of diverticulitis   . History of vertigo   . Hypercholesteremia   . Hypertension   . Migraine   . Neuralgia 2014  . Obesity   . Polymyalgia rheumatica (HCC)    Past Surgical History:  Procedure Laterality Date  . ABDOMINAL HYSTERECTOMY    . BREAST CYST ASPIRATION Right +/- 10 yrs ago   x 2  . HERNIA REPAIR    . JOINT REPLACEMENT Right    knee 2015  . THYROIDECTOMY     Family History  Problem Relation Age of Onset  . Arthritis Mother   . Arthritis Father   . COPD Father   . Arthritis Sister   . Diabetes Sister   . Cancer  Brother   . COPD Brother   . Cancer Son   . Drug abuse Son   . Stroke Maternal Grandmother   . Arthritis Paternal Grandfather    Social History  Substance Use Topics  . Smoking status: Former Games developer  . Smokeless tobacco: Never Used  . Alcohol use No   OB History    No data available     Review of Systems  Constitutional: Positive for activity change. Negative for appetite change, fatigue and fever.  Respiratory: Negative for chest tightness, shortness of breath, wheezing and stridor.   Neurological: Positive for dizziness and headaches. Negative for syncope, facial asymmetry, speech difficulty and weakness.  All other systems reviewed and are negative.   Allergies  Demerol [meperidine]  Home Medications   Prior to Admission medications   Medication Sig Start Date End Date Taking? Authorizing Provider  [facility] D3 5000 units capsule TAKE 5,000 UNITS BY MOUTH ONCE DAILY. 10/10/15   Historical Provider, MD  gabapentin (NEURONTIN) 300 MG capsule Take 300 mg by mouth 3 (three) times daily. 09/23/15   Historical Provider, MD  HYDROcodone-acetaminophen (NORCO/VICODIN) 5-325 MG tablet Take 1 tablet by mouth every 8 (eight) hours as needed for severe pain. 12/04/15   Delano Metz, MD  meloxicam (MOBIC) 15 MG tablet Take 1 tablet (15 mg total) by mouth daily. 12/04/15   Delano Metz, MD  PARoxetine (PAXIL) 40 MG tablet Take 40 mg by mouth daily. 09/11/15   Historical Provider, MD  ranitidine (ZANTAC) 150 MG capsule Take 150 mg by mouth 2 (two) times daily. 09/13/15   Historical Provider, MD  simvastatin (ZOCOR) 20 MG tablet Take by mouth. 05/09/15   Historical Provider, MD   Meds Ordered and Administered this Visit  Medications - No data to display  BP (!) 167/111   Pulse 72   Temp 98 F (36.7 C) (Tympanic)   Resp 16   Ht 5\' 1"  (1.549 m)   Wt 195 lb (88.5 kg)   SpO2 98%   BMI 36.84 kg/m  No data found.   Physical Exam  Constitutional: She is oriented to person, place, and  time. She appears well-developed and well-nourished. No distress.  HENT:  Head: Normocephalic and atraumatic.  Eyes: EOM are normal. Pupils are equal, round, and reactive to light. Right eye exhibits no discharge. Left eye exhibits no discharge.  Neck: Normal range of motion. Neck supple.  Cardiovascular: Normal rate and regular rhythm.   Musculoskeletal: Normal range of motion. She exhibits no edema, tenderness or deformity.  Neurological: She is alert and oriented to person, place, and time.  Skin: Skin is warm and dry. She is not diaphoretic.  Psychiatric: She has a normal mood and affect. Her behavior is normal. Judgment and thought content normal.  Nursing note and vitals reviewed.   Urgent Care Course   Clinical Course    Procedures (including critical care time)  Labs Review Labs Reviewed - No data to display  Imaging Review No results found.   Visual Acuity Review  Right Eye Distance:   Left Eye Distance:   Bilateral Distance:    Right Eye Near:   Left Eye Near:    Bilateral Near:     Repeat manual blood pressure taken with the patient semi-recumbent and 148/106  ED ECG REPORT   Date: 12/23/2015  EKG Time: 1:24 PM  Rate: 72  Rhythm: normal sinus rhythm,  normal EKG  Axis: normal  Intervals:Normal  ST&T Change: No acute  Narrative Interpretation:NSR without acute changes            MDM   1. Essential hypertension   I had a long discussion with the patient and her husband. Told her that her blood pressure is elevated today but not requiring urgent care. He is essentially asymptomatic at the present time. Since she has a long history of labile hypertension she does not appear to be in any danger today I recommended and she agreed that any initiation of treatment should be provided by her primary care physician. It would be helpful if she would lose weight but that is a long-term goal. I have recommended a DASH diet with low salt as a starting point.  Information was provided to her today. If she developed any symptoms such as slurring of speech loss of function etc. she should call 911 and be seen immediately in the emergency department. Otherwise she will make an appointment with Dr. Kirke Corinlmeda early next week. Recommended that she take her blood pressure machine with her that has the recordings of the blood pressure she has taken and I will give her an opportunity to calibrate her machine to his blood pressure cuffs.     Lutricia FeilWilliam P Roemer, PA-C 12/23/15 1324

## 2015-12-24 NOTE — Progress Notes (Signed)
-   Normal fasting (NPO x 8 hours) glucose levels are between 65-99 mg/dl, with 2 hour fasting, levels are usually less than 140 mg/dl. Any random blood glucose level greater than 200 mg/dl is considered to be Diabetes. 

## 2015-12-29 ENCOUNTER — Telehealth: Payer: Self-pay

## 2015-12-29 NOTE — Telephone Encounter (Signed)
Patient called regarding appt. I gave her the date and time

## 2016-01-03 ENCOUNTER — Encounter: Payer: Self-pay | Admitting: Pain Medicine

## 2016-01-03 ENCOUNTER — Ambulatory Visit: Payer: Medicare Other | Attending: Pain Medicine | Admitting: Pain Medicine

## 2016-01-03 VITALS — BP 183/74 | HR 72 | Temp 98.6°F | Resp 16 | Ht 61.0 in | Wt 195.0 lb

## 2016-01-03 DIAGNOSIS — Z96651 Presence of right artificial knee joint: Secondary | ICD-10-CM | POA: Insufficient documentation

## 2016-01-03 DIAGNOSIS — M25561 Pain in right knee: Secondary | ICD-10-CM | POA: Diagnosis not present

## 2016-01-03 DIAGNOSIS — Z8261 Family history of arthritis: Secondary | ICD-10-CM | POA: Diagnosis not present

## 2016-01-03 DIAGNOSIS — M25562 Pain in left knee: Secondary | ICD-10-CM | POA: Diagnosis not present

## 2016-01-03 DIAGNOSIS — M545 Low back pain: Secondary | ICD-10-CM | POA: Diagnosis not present

## 2016-01-03 DIAGNOSIS — K219 Gastro-esophageal reflux disease without esophagitis: Secondary | ICD-10-CM | POA: Diagnosis not present

## 2016-01-03 DIAGNOSIS — M150 Primary generalized (osteo)arthritis: Secondary | ICD-10-CM

## 2016-01-03 DIAGNOSIS — M353 Polymyalgia rheumatica: Secondary | ICD-10-CM | POA: Diagnosis not present

## 2016-01-03 DIAGNOSIS — Z833 Family history of diabetes mellitus: Secondary | ICD-10-CM | POA: Insufficient documentation

## 2016-01-03 DIAGNOSIS — M797 Fibromyalgia: Secondary | ICD-10-CM | POA: Diagnosis not present

## 2016-01-03 DIAGNOSIS — Z79899 Other long term (current) drug therapy: Secondary | ICD-10-CM | POA: Insufficient documentation

## 2016-01-03 DIAGNOSIS — M1712 Unilateral primary osteoarthritis, left knee: Secondary | ICD-10-CM

## 2016-01-03 DIAGNOSIS — E669 Obesity, unspecified: Secondary | ICD-10-CM | POA: Insufficient documentation

## 2016-01-03 DIAGNOSIS — Z825 Family history of asthma and other chronic lower respiratory diseases: Secondary | ICD-10-CM | POA: Insufficient documentation

## 2016-01-03 DIAGNOSIS — Z6836 Body mass index (BMI) 36.0-36.9, adult: Secondary | ICD-10-CM | POA: Insufficient documentation

## 2016-01-03 DIAGNOSIS — Z885 Allergy status to narcotic agent status: Secondary | ICD-10-CM | POA: Diagnosis not present

## 2016-01-03 DIAGNOSIS — G8929 Other chronic pain: Secondary | ICD-10-CM | POA: Insufficient documentation

## 2016-01-03 DIAGNOSIS — E039 Hypothyroidism, unspecified: Secondary | ICD-10-CM | POA: Insufficient documentation

## 2016-01-03 DIAGNOSIS — F419 Anxiety disorder, unspecified: Secondary | ICD-10-CM | POA: Insufficient documentation

## 2016-01-03 DIAGNOSIS — E7800 Pure hypercholesterolemia, unspecified: Secondary | ICD-10-CM | POA: Diagnosis not present

## 2016-01-03 DIAGNOSIS — Z79891 Long term (current) use of opiate analgesic: Secondary | ICD-10-CM | POA: Insufficient documentation

## 2016-01-03 DIAGNOSIS — I10 Essential (primary) hypertension: Secondary | ICD-10-CM | POA: Insufficient documentation

## 2016-01-03 DIAGNOSIS — M159 Polyosteoarthritis, unspecified: Secondary | ICD-10-CM

## 2016-01-03 DIAGNOSIS — F329 Major depressive disorder, single episode, unspecified: Secondary | ICD-10-CM | POA: Diagnosis not present

## 2016-01-03 DIAGNOSIS — M160 Bilateral primary osteoarthritis of hip: Secondary | ICD-10-CM | POA: Diagnosis not present

## 2016-01-03 DIAGNOSIS — M533 Sacrococcygeal disorders, not elsewhere classified: Secondary | ICD-10-CM | POA: Insufficient documentation

## 2016-01-03 DIAGNOSIS — Z87891 Personal history of nicotine dependence: Secondary | ICD-10-CM | POA: Diagnosis not present

## 2016-01-03 MED ORDER — MELOXICAM 15 MG PO TABS: 15.0000 mg | ORAL_TABLET | Freq: Every day | ORAL | 5 refills | Status: DC

## 2016-01-03 NOTE — Progress Notes (Signed)
Patient's Name: Alice [name]  MRN: [id number]  Referring Provider: [facility]  DOB: 28-Mar-1941  PCP: [facility]  DOS: 01/03/2016  Note by: Sydnee Levans. Laban Emperor, MD  Service setting: Ambulatory outpatient  Specialty: Interventional Pain Management  Location: [facility] (AMB) Pain Management Facility    Patient type: Established   Primary Reason(s) for Visit: Encounter for post-procedure evaluation of chronic illness with mild to moderate exacerbation CC: Knee Pain (bilateral) and Back Pain (lower, left)  HPI  [name] is a 75 y.o. year old, female patient, who comes today for an initial evaluation. She has Anxiety; Carpal tunnel syndrome; Depression; Fibromyalgia; GERD (gastroesophageal reflux disease); History of dysphagia; Hypothyroidism; Hyperlipidemia; Hypertension; Migraine headache; Neuralgia; No transfusions per religious beliefs; Osteoarthritis (arthritis due to wear and tear of joints); Chronic pain; Chronic low back pain (Location of Tertiary source of pain) (Bilateral) (L>R); Chronic lower extremity pain (Bilateral) (L>R); Chronic knee pain (Location of Primary Source of Pain) (Bilateral) (L>R); Chronic hip pain (Location of Secondary source of pain) (Bilateral) (L>R); 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); Lumbar facet syndrome (Bilateral) (L>R); Chronic sacroiliac joint pain (Bilateral) (L>R); Long term current use of opiate analgesic; Long term prescription opiate use; Opiate use; Long term current use of non-steroidal anti-inflammatories (NSAID); Disturbance of skin sensation; Diverticulosis; Neurogenic pain; 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 Her primarily concern today is the Knee Pain (bilateral) and Back Pain (lower, left)  Pain Assessment: Self-Reported Pain Score: 1 /10             Reported level is compatible with observation.       Pain Type: Chronic pain Pain Location: Knee (back) Pain Orientation: Left, Right (lower) Pain Descriptors / Indicators: Aching, Sharp Pain Frequency: Constant  The patient comes into the clinics today for post-procedure evaluation on the interventional treatment done on 12/19/2015.  Date of Last Visit: 12/19/15 Service Provided on Last Visit: Procedure  Post-Procedure Assessment  Procedure done on 12/19/2015: Diagnostic left intra-articular knee injection with steroids and right genicular nerves block under fluoro and IV sedation Complications experienced at the time of the procedure: None Side-effects or Adverse reactions: None reported Sedation: Sedation provided. When no sedatives are used, the analgesic levels obtained are directly associated with the effectiveness of the local anesthetics. On the other hand, when sedation is provided, the level of analgesia obtained during the initial 1 hour immediately following the intervention, is believed to be the result of a combination of factors. These factors may include, but are not limited to: 1. The effectiveness of the local anesthetics used. 2. The effects of the analgesic(s) and/or anxiolytic(s) used. 3. The degree of discomfort experienced by the patient at the time of the procedure. 4. The patients ability and reliability in recalling and recording the events. 5. The presence and influence of possible secondary gains. Results: Relief during the 1st hour after the procedure: 100 % (Ultra-Short Term Relief) Interpretation note: Analgesia during this period is likely to be Local Anesthetic and/or IV Sedative  (Analgesic/Anxiolitic) related Local Anesthesia: Long-acting (4-6 hours) anesthetics used. The analgesic levels attained during this period are directly associated to the localized  infiltration of local anesthetics and therefore cary significant diagnostic value as to the etiological location or origin of the pain. Results: Relief during the next 4 to 6 hour after the procedure: 100 % (Short Term Relief) Interpretation note: Complete relief confirms area to be the source of pain Long-Term Therapy: Steroids used. Results: Extended relief following procedure: 90 % (patient states that the pain has come back  minimally on the outer aspects of the knee ) Interpretation note: Long-term benefit would suggest an inflammatory etiology to the pain         Long-Term Benefits:  Current Relief (Now): 90%  Interpretation note: Persistent relief would suggest effective anti-inflammatory effects from steroids Interpretation of Results: Therapy appears to have been very effective in the treatment of this patients pain.  Laboratory Chemistry  Inflammation Markers Lab Results  Component Value Date   ESRSEDRATE 10 11/23/2015   CRP 0.6 11/23/2015   Renal Function Lab Results  Component Value Date   BUN 13 11/23/2015   CREATININE 0.60 11/23/2015   GFRAA >60 11/23/2015   GFRNONAA >60 11/23/2015   Hepatic Function Lab Results  Component Value Date   AST 21 11/23/2015   ALT 16 11/23/2015   ALBUMIN 4.0 11/23/2015   Electrolytes Lab Results  Component Value Date   NA 140 11/23/2015   K 3.9 11/23/2015   CL 108 11/23/2015   CALCIUM 9.2 11/23/2015   MG 1.9 11/23/2015   Pain Modulating Vitamins Lab Results  Component Value Date   25OHVITD1 40 11/23/2015   25OHVITD2 8.5 11/23/2015   25OHVITD3 31 11/23/2015   VITAMINB12 271 11/23/2015   Coagulation Parameters Lab Results  Component Value Date   INR 1.0 10/21/2013   LABPROT 13.5 10/21/2013   APTT 34.8 10/21/2013   PLT 202 10/13/2015    Cardiovascular Lab Results  Component Value Date   HGB 15.9 10/13/2015   HCT 44.7 10/13/2015   Note: Lab results reviewed.  Recent Diagnostic Imaging  No results found. Meds  The patient has a current medication list which includes the following prescription(s): [facility] d3, gabapentin, hydrocodone-acetaminophen, meloxicam, paroxetine, ranitidine, and simvastatin.  Current Outpatient Prescriptions on File Prior to Visit  Medication Sig  . [facility] D3 5000 units capsule TAKE 5,000 UNITS BY MOUTH ONCE DAILY.  Marland Kitchen gabapentin (NEURONTIN) 300 MG capsule Take 300 mg by mouth 3 (three) times daily.  Marland Kitchen HYDROcodone-acetaminophen (NORCO/VICODIN) 5-325 MG tablet Take 1 tablet by mouth every 8 (eight) hours as needed for severe pain.  Marland Kitchen PARoxetine (PAXIL) 40 MG tablet Take 40 mg by mouth daily.  . ranitidine (ZANTAC) 150 MG capsule Take 150 mg by mouth 2 (two) times daily.  . simvastatin (ZOCOR) 20 MG tablet Take by mouth.   No current facility-administered medications on file prior to visit.    ROS  Constitutional: Denies any fever or chills Gastrointestinal: No reported hemesis, hematochezia, vomiting, or acute GI distress Musculoskeletal: Denies any acute onset joint swelling, redness, loss of ROM, or weakness Neurological: No reported episodes of acute onset apraxia, aphasia, dysarthria, agnosia, amnesia, paralysis, loss of coordination, or loss of consciousness  Allergies  Ms. Wartman is allergic to demerol [meperidine].  PFSH  Medical:  Ms. Stuber  has a past medical history of Anxiety; Arthritis; Depression; Fibromyalgia; GERD (gastroesophageal reflux disease); History of carpal tunnel syndrome; History of diverticulitis; History of vertigo; Hypercholesteremia; Hypertension; Migraine; Neuralgia (2014); Obesity; and Polymyalgia rheumatica (HCC). Family: family history includes Arthritis in her father, mother, paternal grandfather, and sister; COPD in her brother  and father; Cancer in her brother  and son; Diabetes in her sister; Drug abuse in her son; Stroke in her maternal grandmother. Surgical:  has a past surgical history that includes Breast cyst aspiration (Right, +/- 10 yrs ago); Abdominal hysterectomy; Hernia repair; Joint replacement (Right); and Thyroidectomy. Tobacco:  reports that she has quit smoking. She has never used smokeless tobacco. Alcohol:  reports that she does not drink alcohol. Drug:  reports that she does not use drugs.  Constitutional Exam  General appearance: Well nourished, well developed, and well hydrated. In no acute distress Vitals:   01/03/16 1009  BP: (!) 183/74  Pulse: 72  Resp: 16  Temp: 98.6 F (37 C)  TempSrc: Oral  SpO2: 98%  Weight: 195 lb (88.5 kg)  Height: 5\' 1"  (1.549 m)  BMI Assessment: Estimated body mass index is 36.84 kg/m as calculated from the following:   Height as of this encounter: 5\' 1"  (1.549 m).   Weight as of this encounter: 195 lb (88.5 kg).   BMI interpretation:           BMI Readings from Last 4 Encounters:  01/03/16 36.84 kg/m  12/23/15 36.84 kg/m  12/19/15 36.84 kg/m  12/04/15 36.84 kg/m   Wt Readings from Last 4 Encounters:  01/03/16 195 lb (88.5 kg)  12/23/15 195 lb (88.5 kg)  12/19/15 195 lb (88.5 kg)  12/04/15 195 lb (88.5 kg)  Psych/Mental status: Alert and oriented x 3 (person, place, & time) Eyes: PERLA Respiratory: No evidence of acute respiratory distress  Cervical Spine Exam  Inspection: No masses, redness, or swelling Alignment: Symmetrical Functional ROM: ROM appears unrestricted Stability: No instability detected Muscle strength & Tone: Functionally intact Sensory: Unimpaired Palpation: Non-contributory  Upper Extremity (UE) Exam    Side: Right upper extremity  Side: Left upper extremity  Inspection: No masses, redness, swelling, or asymmetry  Inspection: No masses, redness, swelling, or asymmetry  Functional ROM: ROM appears unrestricted          Functional ROM: ROM appears  unrestricted          Muscle strength & Tone: Functionally intact  Muscle strength & Tone: Functionally intact  Sensory: Unimpaired  Sensory: Unimpaired  Palpation: Non-contributory  Palpation: Non-contributory   Thoracic Spine Exam  Inspection: No masses, redness, or swelling Alignment: Symmetrical Functional ROM: ROM appears unrestricted Stability: No instability detected Sensory: Unimpaired Muscle strength & Tone: Functionally intact Palpation: Non-contributory  Lumbar Spine Exam  Inspection: No masses, redness, or swelling Alignment: Symmetrical Functional ROM: ROM appears unrestricted Stability: No instability detected Muscle strength & Tone: Functionally intact Sensory: Unimpaired Palpation: Non-contributory Provocative Tests: Lumbar Hyperextension and rotation test: evaluation deferred today       Patrick's Maneuver: evaluation deferred today              Gait & Posture Assessment  Ambulation: Unassisted Gait: Relatively normal for age and body habitus Posture: WNL   Lower Extremity Exam    Side: Right lower extremity  Side: Left lower extremity  Inspection: No masses, redness, swelling, or asymmetry  Inspection: No masses, redness, swelling, or asymmetry  Functional ROM: ROM appears unrestricted          Functional ROM: ROM appears unrestricted          Muscle strength & Tone: Functionally intact  Muscle strength & Tone: Functionally intact  Sensory: Unimpaired  Sensory: Unimpaired  Palpation: Non-contributory  Palpation: Non-contributory    Assessment & Plan  Primary Diagnosis & Pertinent Problem List: The  primary encounter diagnosis was Chronic knee pain (Location of Primary Source of Pain) (Bilateral) (L>R). Diagnoses of Primary osteoarthritis involving multiple joints, Chronic knee pain (Left), and Primary osteoarthritis of left knee were also pertinent to this visit.  Visit Diagnosis: 1. Chronic knee pain (Location of Primary Source of Pain) (Bilateral) (L>R)    2. Primary osteoarthritis involving multiple joints   3. Chronic knee pain (Left)   4. Primary osteoarthritis of left knee    Plan of Care   Problem List Items Addressed This Visit      High   Chronic knee pain (Left) (Chronic)   Relevant Medications   meloxicam (MOBIC) 15 MG tablet   Chronic knee pain (Location of Primary Source of Pain) (Bilateral) (L>R) - Primary (Chronic)   Relevant Medications   meloxicam (MOBIC) 15 MG tablet   Other Relevant Orders   Radiofrequency,Genicular   Osteoarthritis (arthritis due to wear and tear of joints)   Relevant Medications   meloxicam (MOBIC) 15 MG tablet   Osteoarthritis of knee (Left) (Chronic)   Relevant Medications   meloxicam (MOBIC) 15 MG tablet    Other Visit Diagnoses   None.    Pharmacotherapy (Medications Ordered): Meds ordered this encounter  Medications  . meloxicam (MOBIC) 15 MG tablet    Sig: Take 1 tablet (15 mg total) by mouth daily.    Dispense:  30 tablet    Refill:  5    Do not place this medication, or any other prescription from our practice, on "Automatic Refill". Patient may have prescription filled one day early if pharmacy is closed on scheduled refill date.   New Prescriptions   No medications on file   Medications administered during this visit: Ms. Klausner had no medications administered during this visit. Lab-work, Procedure(s), & Referral(s) Ordered: Orders Placed This Encounter  Procedures  . Radiofrequency,Genicular   Imaging & Referral(s) Ordered: None  Interventional Therapies: Scheduled:  Bilateral Genicular RFA   Considering: Diagnostic right knee genicular nerve block under fluoroscopic guidance, with or without sedation. Diagnostic left intra-articular knee injection with local anesthetic, without fluoroscopy or IV sedation. Possible series of 5 left intra-articular knee Hyalgan injections, without fluoroscopy or IV sedation. Possible diagnostic left genicular nerve block under  fluoroscopic guidance, with or without sedation. Possible bilateral genicular nerve radiofrequency ablation under fluoroscopic guidance and IV sedation. Diagnostic bilateral lumbar facet block under fluoroscopic guidance and IV sedation. Possible bilateral lumbar facet radiofrequency ablation under fluoroscopic guidance and IV sedation the pending on the results of the diagnostic injection.  Diagnostic left sided L4-5 lumbar epidural steroid injection under fluoroscopic guidance, with or without sedation.    PRN Procedures: Diagnostic right knee genicular nerve block under fluoroscopic guidance, with or without sedation. Diagnostic left intra-articular knee injection with local anesthetic, without fluoroscopy or IV sedation. Diagnostic bilateral lumbar facet block under fluoroscopic guidance and IV sedation. Diagnostic left sided L4-5 lumbar epidural steroid injection under fluoroscopic guidance, with or without sedation.    Requested PM Follow-up: Return in about 6 months (around 07/02/2016) for Med-Mgmt, In addition, Schedule Procedure, (ASAA).  Future Appointments Date Time Provider Department Center  01/09/2016 9:45 AM Delano Metz, MD [facility] None  07/01/2016 9:20 AM Delano Metz, MD [facility] None    Primary Care Physician: [facility] Location: [facility] Outpatient Pain Management Facility Note by: Constance Hackenberg A. Laban Emperor, M.D, DABA, DABAPM, DABPM, DABIPP, FIPP  Pain Score Disclaimer: We use the NRS-11 scale. This is a self-reported, subjective measurement of pain severity with only  modest accuracy. It is used primarily to identify changes within a particular patient. It must be understood that outpatient pain scales are significantly less accurate that those used for research, where they can be applied under ideal controlled circumstances with minimal exposure to variables. In reality, the score is likely to be a combination of pain intensity and pain affect, where  pain affect describes the degree of emotional arousal or changes in action readiness caused by the sensory experience of pain. Factors such as social and work situation, setting, emotional state, anxiety levels, expectation, and prior pain experience may influence pain perception and show large inter-individual differences that may also be affected by time variables.  Patient instructions provided during this appointment: Patient Instructions  Radiofrequency Lesioning Radiofrequency lesioning is a procedure that is performed to relieve pain. The procedure is often used for back, neck, or arm pain. Radiofrequency lesioning involves the use of a machine that creates radio waves to make heat. During the procedure, the heat is applied to the nerve that carries the pain signal. The heat damages the nerve and interferes with the pain signal. Pain relief usually lasts for 6 months to 1 year. LET [facility] Ps[facility]OUR HEALTH CARE PROVIDER KNOW ABOUT:  Any allergies you have.  All medicines you are taking, including vitamins, herbs, eye drops, creams, and over-the-counter medicines.  Previous problems you or members of your family have had with the use of anesthetics.  Any blood disorders you have.  Previous surgeries you have had.  Any medical conditions you have.  Whether you are pregnant or may be pregnant. RISKS AND COMPLICATIONS Generally, this is a safe procedure. However, problems may occur, including:  Pain or soreness at the injection site.  Infection at the injection site.  Damage to nerves or blood vessels. BEFORE THE PROCEDURE  Ask your health care provider about:  Changing or stopping your regular medicines. This is especially important if you are taking diabetes medicines or blood thinners.  Taking medicines such as aspirin and ibuprofen. These medicines can thin your blood. Do not take these medicines before your procedure if your health care provider instructs you not to.  Follow  instructions from your health care provider about eating or drinking restrictions.  Plan to have someone take you home after the procedure.  If you go home right after the procedure, plan to have someone with you for 24 hours. PROCEDURE  You will be given one or more of the following:  A medicine to help you relax (sedative).  A medicine to numb the area (local anesthetic).  You will be awake during the procedure. You will need to be able to talk with the health care provider during the procedure.  With the help of a type of X-ray (fluoroscopy), the health care provider will insert a radiofrequency needle into the area to be treated.  Next, a wire that carries the radio waves (electrode) will be put through the radiofrequency needle. An electrical pulse will be sent through the electrode to verify the correct nerve. You will feel a tingling sensation, and you may have muscle twitching.  Then, the tissue that is around the needle tip will be heated by an electric current that is passed using the radiofrequency machine. This will numb the nerves.  A bandage (dressing) will be put on the insertion area after the procedure is done. The procedure may vary among health care providers and hospitals. AFTER THE PROCEDURE  Your blood pressure, heart rate, breathing rate, and blood oxygen  level will be monitored often until the medicines you were given have worn off.  Return to your normal activities as directed by your health care provider.   This information is not intended to replace advice given to you by your health care provider. Make sure you discuss any questions you have with your health care provider.   Document Released: 11/28/2010 Document Revised: 12/21/2014 Document Reviewed: 05/09/2014 Elsevier Interactive Patient Education 2016 [employer]  DO NOT EAT OR DRINK FOR 8 HOURS PRIOR TO PROCEDURE BRING A DRIVER

## 2016-01-03 NOTE — Patient Instructions (Addendum)
Radiofrequency Lesioning Radiofrequency lesioning is a procedure that is performed to relieve pain. The procedure is often used for back, neck, or arm pain. Radiofrequency lesioning involves the use of a machine that creates radio waves to make heat. During the procedure, the heat is applied to the nerve that carries the pain signal. The heat damages the nerve and interferes with the pain signal. Pain relief usually lasts for 6 months to 1 year. LET YOUR HEALTH CARE PROVIDER KNOW ABOUT:  Any allergies you have.  All medicines you are taking, including vitamins, herbs, eye drops, creams, and over-the-counter medicines.  Previous problems you or members of your family have had with the use of anesthetics.  Any blood disorders you have.  Previous surgeries you have had.  Any medical conditions you have.  Whether you are pregnant or may be pregnant. RISKS AND COMPLICATIONS Generally, this is a safe procedure. However, problems may occur, including:  Pain or soreness at the injection site.  Infection at the injection site.  Damage to nerves or blood vessels. BEFORE THE PROCEDURE  Ask your health care provider about:  Changing or stopping your regular medicines. This is especially important if you are taking diabetes medicines or blood thinners.  Taking medicines such as aspirin and ibuprofen. These medicines can thin your blood. Do not take these medicines before your procedure if your health care provider instructs you not to.  Follow instructions from your health care provider about eating or drinking restrictions.  Plan to have someone take you home after the procedure.  If you go home right after the procedure, plan to have someone with you for 24 hours. PROCEDURE  You will be given one or more of the following:  A medicine to help you relax (sedative).  A medicine to numb the area (local anesthetic).  You will be awake during the procedure. You will need to be able to  talk with the health care provider during the procedure.  With the help of a type of X-ray (fluoroscopy), the health care provider will insert a radiofrequency needle into the area to be treated.  Next, a wire that carries the radio waves (electrode) will be put through the radiofrequency needle. An electrical pulse will be sent through the electrode to verify the correct nerve. You will feel a tingling sensation, and you may have muscle twitching.  Then, the tissue that is around the needle tip will be heated by an electric current that is passed using the radiofrequency machine. This will numb the nerves.  A bandage (dressing) will be put on the insertion area after the procedure is done. The procedure may vary among health care providers and hospitals. AFTER THE PROCEDURE  Your blood pressure, heart rate, breathing rate, and blood oxygen level will be monitored often until the medicines you were given have worn off.  Return to your normal activities as directed by your health care provider.   This information is not intended to replace advice given to you by your health care provider. Make sure you discuss any questions you have with your health care provider.   Document Released: 11/28/2010 Document Revised: 12/21/2014 Document Reviewed: 05/09/2014 Elsevier Interactive Patient Education 2016 [employer]   DO NOT EAT OR DRINK FOR 8 HOURS PRIOR TO PROCEDURE BRING A DRIVER 

## 2016-01-03 NOTE — Progress Notes (Addendum)
Patient here for medication management.   Patient would like to have mobic refilled today as this is what she uses for her pain control.  Hydrocodone - APAP 5 - 325 mg qty 60/60 last fill 12/10/15  Safety precautions to be maintained throughout the outpatient stay will include: orient to surroundings, keep bed in low position, maintain call bell within reach at all times, provide assistance with transfer out of bed and ambulation.

## 2016-01-09 ENCOUNTER — Ambulatory Visit (HOSPITAL_BASED_OUTPATIENT_CLINIC_OR_DEPARTMENT_OTHER): Payer: Medicare Other | Admitting: Pain Medicine

## 2016-01-09 ENCOUNTER — Encounter: Payer: Self-pay | Admitting: Pain Medicine

## 2016-01-09 ENCOUNTER — Ambulatory Visit
Admission: RE | Admit: 2016-01-09 | Discharge: 2016-01-09 | Disposition: A | Discharge status: Home / Self Care | Payer: Medicare Other | Source: Ambulatory Visit | Attending: Pain Medicine | Admitting: Pain Medicine

## 2016-01-09 VITALS — BP 160/96 | HR 67 | Temp 96.6°F | Resp 17 | Ht 61.0 in | Wt 195.0 lb

## 2016-01-09 DIAGNOSIS — M47816 Spondylosis without myelopathy or radiculopathy, lumbar region: Secondary | ICD-10-CM | POA: Insufficient documentation

## 2016-01-09 DIAGNOSIS — I10 Essential (primary) hypertension: Secondary | ICD-10-CM | POA: Diagnosis not present

## 2016-01-09 DIAGNOSIS — M25562 Pain in left knee: Secondary | ICD-10-CM | POA: Insufficient documentation

## 2016-01-09 DIAGNOSIS — G8929 Other chronic pain: Secondary | ICD-10-CM | POA: Insufficient documentation

## 2016-01-09 DIAGNOSIS — M160 Bilateral primary osteoarthritis of hip: Secondary | ICD-10-CM | POA: Diagnosis not present

## 2016-01-09 DIAGNOSIS — K219 Gastro-esophageal reflux disease without esophagitis: Secondary | ICD-10-CM | POA: Diagnosis not present

## 2016-01-09 DIAGNOSIS — M797 Fibromyalgia: Secondary | ICD-10-CM | POA: Diagnosis not present

## 2016-01-09 DIAGNOSIS — M1712 Unilateral primary osteoarthritis, left knee: Secondary | ICD-10-CM | POA: Diagnosis not present

## 2016-01-09 DIAGNOSIS — M25561 Pain in right knee: Secondary | ICD-10-CM | POA: Insufficient documentation

## 2016-01-09 DIAGNOSIS — G8918 Other acute postprocedural pain: Secondary | ICD-10-CM | POA: Insufficient documentation

## 2016-01-09 DIAGNOSIS — G5600 Carpal tunnel syndrome, unspecified upper limb: Secondary | ICD-10-CM | POA: Insufficient documentation

## 2016-01-09 DIAGNOSIS — Z96651 Presence of right artificial knee joint: Secondary | ICD-10-CM | POA: Diagnosis not present

## 2016-01-09 DIAGNOSIS — E785 Hyperlipidemia, unspecified: Secondary | ICD-10-CM | POA: Insufficient documentation

## 2016-01-09 DIAGNOSIS — Z79891 Long term (current) use of opiate analgesic: Secondary | ICD-10-CM | POA: Diagnosis not present

## 2016-01-09 DIAGNOSIS — Z6836 Body mass index (BMI) 36.0-36.9, adult: Secondary | ICD-10-CM | POA: Insufficient documentation

## 2016-01-09 DIAGNOSIS — G894 Chronic pain syndrome: Secondary | ICD-10-CM

## 2016-01-09 DIAGNOSIS — F419 Anxiety disorder, unspecified: Secondary | ICD-10-CM | POA: Insufficient documentation

## 2016-01-09 DIAGNOSIS — M545 Low back pain: Secondary | ICD-10-CM | POA: Diagnosis present

## 2016-01-09 DIAGNOSIS — M47812 Spondylosis without myelopathy or radiculopathy, cervical region: Secondary | ICD-10-CM | POA: Insufficient documentation

## 2016-01-09 MED ORDER — MIDAZOLAM HCL 5 MG/5ML IJ SOLN
1.0000 mg | INTRAMUSCULAR | Status: DC | PRN
  Administered 2016-01-09: 2 mg via INTRAVENOUS
  Filled 2016-01-09: qty 5

## 2016-01-09 MED ORDER — LIDOCAINE HCL (PF) 1 % IJ SOLN
10.0000 mL | Freq: Once | INTRAMUSCULAR | Status: AC
  Administered 2016-01-09: 10 mL

## 2016-01-09 MED ORDER — FENTANYL CITRATE (PF) 100 MCG/2ML IJ SOLN
25.0000 ug | INTRAMUSCULAR | Status: DC | PRN
  Administered 2016-01-09: 25 ug via INTRAVENOUS
  Filled 2016-01-09: qty 2

## 2016-01-09 MED ORDER — LACTATED RINGERS IV SOLN
1000.0000 mL | Freq: Once | INTRAVENOUS | Status: AC
  Administered 2016-01-09: 1000 mL via INTRAVENOUS

## 2016-01-09 MED ORDER — ROPIVACAINE HCL 2 MG/ML IJ SOLN
9.0000 mL | Freq: Once | INTRAMUSCULAR | Status: AC
  Administered 2016-01-09: 9 mL
  Filled 2016-01-09: qty 10

## 2016-01-09 MED ORDER — METHYLPREDNISOLONE ACETATE 80 MG/ML IJ SUSP
80.0000 mg | Freq: Once | INTRAMUSCULAR | Status: AC
  Administered 2016-01-09: 80 mg
  Filled 2016-01-09: qty 1

## 2016-01-09 MED ORDER — HYDROCODONE-ACETAMINOPHEN 5-325 MG PO TABS: 1.0000 | ORAL_TABLET | Freq: Three times a day (TID) | ORAL | 0 refills | Status: DC | PRN

## 2016-01-09 NOTE — Progress Notes (Signed)
Safety precautions to be maintained throughout the outpatient stay will include: orient to surroundings, keep bed in low position, maintain call bell within reach at all times, provide assistance with transfer out of bed and ambulation.   Procedure today  Pt in ED 12/19/15 r/t BP issues- Patient not currently on BP med but is to see her primary MD for this issue

## 2016-01-09 NOTE — Patient Instructions (Signed)
Radiofrequency Lesioning, Care After Refer to this sheet in the next few weeks. These instructions provide you with information about caring for yourself after your procedure. Your health care provider may also give you more specific instructions. Your treatment has been planned according to current medical practices, but problems sometimes occur. Call your health care provider if you have any problems or questions after your procedure. WHAT TO EXPECT AFTER THE PROCEDURE After your procedure, it is common to have:  Pain from the burned nerve.  Temporary numbness. HOME CARE INSTRUCTIONS  Take over-the-counter and prescription medicines only as told by your health care provider.  Return to your normal activities as told by your health care provider. Ask your health care provider what activities are safe for you.  Pay close attention to how you feel after the procedure. If you start to have pain, write down when it hurts and how it feels. This will help you and your health care provider to know if you need an additional treatment.  Check your needle insertion site every day for signs of infection. Watch for:  Redness, swelling, or pain.  Fluid, blood, or pus.  Keep all follow-up visits as told by your health care provider. This is important. SEEK MEDICAL CARE IF:  Your pain does not get better.  You have redness, swelling, or pain at the needle insertion site.  You have fluid, blood, or pus coming from the needle insertion site.  You have a fever. SEEK IMMEDIATE MEDICAL CARE IF:  You develop sudden, severe pain.  You develop numbness or tingling near the procedure site that does not go away.   This information is not intended to replace advice given to you by your health care provider. Make sure you discuss any questions you have with your health care provider.   Document Released: 11/28/2010 Document Revised: 12/21/2014 Document Reviewed: 05/09/2014 [employer] Interactive Patient  Education 2016 [employer] Radiofrequency Lesioning Radiofrequency lesioning is a procedure that is performed to relieve pain. The procedure is often used for back, neck, or arm pain. Radiofrequency lesioning involves the use of a machine that creates radio waves to make heat. During the procedure, the heat is applied to the nerve that carries the pain signal. The heat damages the nerve and interferes with the pain signal. Pain relief usually lasts for 6 months to 1 year. LET YOUR HEALTH CARE PROVIDER KNOW ABOUT:  Any allergies you have.  All medicines you are taking, including vitamins, herbs, eye drops, creams, and over-the-counter medicines.  Previous problems you or members of your family have had with the use of anesthetics.  Any blood disorders you have.  Previous surgeries you have had.  Any medical conditions you have.  Whether you are pregnant or may be pregnant. RISKS AND COMPLICATIONS Generally, this is a safe procedure. However, problems may occur, including:  Pain or soreness at the injection site.  Infection at the injection site.  Damage to nerves or blood vessels. BEFORE THE PROCEDURE  Ask your health care provider about:  Changing or stopping your regular medicines. This is especially important if you are taking diabetes medicines or blood thinners.  Taking medicines such as aspirin and ibuprofen. These medicines can thin your blood. Do not take these medicines before your procedure if your health care provider instructs you not to.  Follow instructions from your health care provider about eating or drinking restrictions.  Plan to have someone take you home after the procedure.  If you go home right   after the procedure, plan to have someone with you for 24 hours. PROCEDURE  You will be given one or more of the following:  A medicine to help you relax (sedative).  A medicine to numb the area (local anesthetic).  You will be awake during the procedure.  You will need to be able to talk with the health care provider during the procedure.  With the help of a type of X-ray (fluoroscopy), the health care provider will insert a radiofrequency needle into the area to be treated.  Next, a wire that carries the radio waves (electrode) will be put through the radiofrequency needle. An electrical pulse will be sent through the electrode to verify the correct nerve. You will feel a tingling sensation, and you may have muscle twitching.  Then, the tissue that is around the needle tip will be heated by an electric current that is passed using the radiofrequency machine. This will numb the nerves.  A bandage (dressing) will be put on the insertion area after the procedure is done. The procedure may vary among health care providers and hospitals. AFTER THE PROCEDURE  Your blood pressure, heart rate, breathing rate, and blood oxygen level will be monitored often until the medicines you were given have worn off.  Return to your normal activities as directed by your health care provider.   This information is not intended to replace advice given to you by your health care provider. Make sure you discuss any questions you have with your health care provider.   Document Released: 11/28/2010 Document Revised: 12/21/2014 Document Reviewed: 05/09/2014 Else[employer] Interactive Patient Education 2016 [employer] Pain Management Discharge Instructions  General Discharge Instructions :  If you need to reach your doctor call: Monday-Friday 8:00 am - 4:00 pm at [phone] or toll free [phone].  After clinic hours [phone] to have operator reach doctor.  Bring all of your medication bottles to all your appointments in the pain clinic.  To cancel or reschedule your appointment with Pain Management please remember to call 24 hours in advance to avoid a fee.  Refer to the educational materials which you have been given on: General Risks, I had my  Procedure. Discharge Instructions, Post Sedation.  Post Procedure Instructions:  The drugs you were given will stay in your system until tomorrow, so for the next 24 hours you should not drive, make any legal decisions or drink any alcoholic beverages.  You may eat anything you prefer, but it is better to start with liquids then soups and crackers, and gradually work up to solid foods.  Please notify your doctor immediately if you have any unusual bleeding, trouble breathing or pain that is not related to your normal pain.  Depending on the type of procedure that was done, some parts of your body may feel week and/or numb.  This usually clears up by tonight or the next day.  Walk with the use of an assistive device or accompanied by an adult for the 24 hours.  You may use ice on the affected area for the first 24 hours.  Put ice in a Ziploc bag and cover with a towel and place against area 15 minutes on 15 minutes off.  You may switch to heat after 24 hours.GENERAL RISKS AND COMPLICATIONS  What are the risk, side effects and possible complications? Generally speaking, most procedures are safe.  However, with any procedure there are risks, side effects, and the possibility of complications.  The risks and complications are dependent   upon the sites that are lesioned, or the type of nerve block to be performed.  The closer the procedure is to the spine, the more serious the risks are.  Great care is taken when placing the radio frequency needles, block needles or lesioning probes, but sometimes complications can occur. 1. Infection: Any time there is an injection through the skin, there is a risk of infection.  This is why sterile conditions are used for these blocks.  There are four possible types of infection. 1. Localized skin infection. 2. Central Nervous System Infection-This can be in the form of Meningitis, which can be deadly. 3. Epidural Infections-This can be in the form of an epidural  abscess, which can cause pressure inside of the spine, causing compression of the spinal cord with subsequent paralysis. This would require an emergency surgery to decompress, and there are no guarantees that the patient would recover from the paralysis. 4. Discitis-This is an infection of the intervertebral discs.  It occurs in about 1% of discography procedures.  It is difficult to treat and it may lead to surgery.        2. Pain: the needles have to go through skin and soft tissues, will cause soreness.       3. Damage to internal structures:  The nerves to be lesioned may be near blood vessels or    other nerves which can be potentially damaged.       4. Bleeding: Bleeding is more common if the patient is taking blood thinners such as  aspirin, Coumadin, Ticiid, Plavix, etc., or if he/she have some genetic predisposition  such as hemophilia. Bleeding into the spinal canal can cause compression of the spinal  cord with subsequent paralysis.  This would require an emergency surgery to  decompress and there are no guarantees that the patient would recover from the  paralysis.       5. Pneumothorax:  Puncturing of a lung is a possibility, every time a needle is introduced in  the area of the chest or upper back.  Pneumothorax refers to free air around the  collapsed lung(s), inside of the thoracic cavity (chest cavity).  Another two possible  complications related to a similar event would include: Hemothorax and Chylothorax.   These are variations of the Pneumothorax, where instead of air around the collapsed  lung(s), you may have blood or chyle, respectively.       6. Spinal headaches: They may occur with any procedures in the area of the spine.       7. Persistent CSF (Cerebro-Spinal Fluid) leakage: This is a rare problem, but may occur  with prolonged intrathecal or epidural catheters either due to the formation of a fistulous  track or a dural tear.       8. Nerve damage: By working so close to the  spinal cord, there is always a possibility of  nerve damage, which could be as serious as a permanent spinal cord injury with  paralysis.       9. Death:  Although rare, severe deadly allergic reactions known as "Anaphylactic  reaction" can occur to any of the medications used.      10. Worsening of the symptoms:  We can always make thing worse.  What are the chances of something like this happening? Chances of any of this occuring are extremely low.  By statistics, you have more of a chance of getting killed in a motor vehicle accident: while driving to the hospital   than any of the above occurring .  Nevertheless, you should be aware that they are possibilities.  In general, it is similar to taking a shower.  Everybody knows that you can slip, hit your head and get killed.  Does that mean that you should not shower again?  Nevertheless always keep in mind that statistics do not mean anything if you happen to be on the wrong side of them.  Even if a procedure has a 1 (one) in a 1,000,000 (million) chance of going wrong, it you happen to be that one..Also, keep in mind that by statistics, you have more of a chance of having something go wrong when taking medications.  Who should not have this procedure? If you are on a blood thinning medication (e.g. Coumadin, Plavix, see list of "Blood Thinners"), or if you have an active infection going on, you should not have the procedure.  If you are taking any blood thinners, please inform your physician.  How should I prepare for this procedure?  Do not eat or drink anything at least six hours prior to the procedure.  Bring a driver with you .  It cannot be a taxi.  Come accompanied by an adult that can drive you back, and that is strong enough to help you if your legs get weak or numb from the local anesthetic.  Take all of your medicines the morning of the procedure with just enough water to swallow them.  If you have diabetes, make sure that you are  scheduled to have your procedure done first thing in the morning, whenever possible.  If you have diabetes, take only half of your insulin dose and notify our nurse that you have done so as soon as you arrive at the clinic.  If you are diabetic, but only take blood sugar pills (oral hypoglycemic), then do not take them on the morning of your procedure.  You may take them after you have had the procedure.  Do not take aspirin or any aspirin-containing medications, at least eleven (11) days prior to the procedure.  They may prolong bleeding.  Wear loose fitting clothing that may be easy to take off and that you would not mind if it got stained with Betadine or blood.  Do not wear any jewelry or perfume  Remove any nail coloring.  It will interfere with some of our monitoring equipment.  NOTE: Remember that this is not meant to be interpreted as a complete list of all possible complications.  Unforeseen problems may occur.  BLOOD THINNERS The following drugs contain aspirin or other products, which can cause increased bleeding during surgery and should not be taken for 2 weeks prior to and 1 week after surgery.  If you should need take something for relief of minor pain, you may take acetaminophen which is found in Tylenol,m Datril, Anacin-3 and Panadol. It is not blood thinner. The products listed below are.  Do not take any of the products listed below in addition to any listed on your instruction sheet.  A.P.C or A.P.C with Codeine Codeine Phosphate Capsules #3 Ibuprofen Ridaura  ABC compound Congesprin Imuran rimadil  Advil Cope Indocin Robaxisal  Alka-Seltzer Effervescent Pain Reliever and Antacid Coricidin or Coricidin-D  Indomethacin Rufen  Alka-Seltzer plus Cold Medicine Cosprin Ketoprofen S-A-C Tablets  Anacin Analgesic Tablets or Capsules Coumadin Korlgesic Salflex  Anacin Extra Strength Analgesic tablets or capsules CP-2 Tablets Lanoril Salicylate  Anaprox Cuprimine Capsules  Levenox Salocol  Anexsia-D Dalteparin Magan Salsalate    Anodynos Darvon compound Magnesium Salicylate Sine-off  Ansaid Dasin Capsules Magsal Sodium Salicylate  Anturane Depen Capsules Marnal Soma  APF Arthritis pain formula Dewitt's Pills Measurin Stanback  Argesic Dia-Gesic Meclofenamic Sulfinpyrazone  Arthritis Bayer Timed Release Aspirin Diclofenac Meclomen Sulindac  Arthritis pain formula Anacin Dicumarol Medipren Supac  Analgesic (Safety coated) Arthralgen Diffunasal Mefanamic Suprofen  Arthritis Strength Bufferin Dihydrocodeine Mepro Compound Suprol  Arthropan liquid Dopirydamole Methcarbomol with Aspirin Synalgos  ASA tablets/Enseals Disalcid Micrainin Tagament  Ascriptin Doan's Midol Talwin  Ascriptin A/D Dolene Mobidin Tanderil  Ascriptin Extra Strength Dolobid Moblgesic Ticlid  Ascriptin with Codeine Doloprin or Doloprin with Codeine Momentum Tolectin  Asperbuf Duoprin Mono-gesic Trendar  Aspergum Duradyne Motrin or Motrin IB Triminicin  Aspirin plain, buffered or enteric coated Durasal Myochrisine Trigesic  Aspirin Suppositories Easprin Nalfon Trillsate  Aspirin with Codeine Ecotrin Regular or Extra Strength Naprosyn Uracel  Atromid-S Efficin Naproxen Ursinus  Auranofin Capsules Elmiron Neocylate Vanquish  Axotal Emagrin Norgesic Verin  Azathioprine Empirin or Empirin with Codeine Normiflo Vitamin E  Azolid Emprazil Nuprin Voltaren  Bayer Aspirin plain, buffered or children's or timed BC Tablets or powders Encaprin Orgaran Warfarin Sodium  Buff-a-Comp Enoxaparin Orudis Zorpin  Buff-a-Comp with Codeine Equegesic Os-Cal-Gesic   Buffaprin Excedrin plain, buffered or Extra Strength Oxalid   Bufferin Arthritis Strength Feldene Oxphenbutazone   Bufferin plain or Extra Strength Feldene Capsules Oxycodone with Aspirin   Bufferin with Codeine Fenoprofen Fenoprofen Pabalate or Pabalate-SF   Buffets II Flogesic Panagesic   Buffinol plain or Extra Strength Florinal or Florinal with  Codeine Panwarfarin   Buf-Tabs Flurbiprofen Penicillamine   Butalbital Compound Four-way cold tablets Penicillin   Butazolidin Fragmin Pepto-Bismol   Carbenicillin Geminisyn Percodan   Carna Arthritis Reliever Geopen Persantine   Carprofen Gold's salt Persistin   Chloramphenicol Goody's Phenylbutazone   Chloromycetin Haltrain Piroxlcam   Clmetidine heparin Plaquenil   Cllnoril Hyco-pap Ponstel   Clofibrate Hydroxy chloroquine Propoxyphen         Before stopping any of these medications, be sure to consult the physician who ordered them.  Some, such as Coumadin (Warfarin) are ordered to prevent or treat serious conditions such as "deep thrombosis", "pumonary embolisms", and other heart problems.  The amount of time that you may need off of the medication may also vary with the medication and the reason for which you were taking it.  If you are taking any of these medications, please make sure you notify your pain physician before you undergo any procedures.          

## 2016-01-09 NOTE — Progress Notes (Addendum)
Patient's Name: Alice [name]  MRN: [id number]  Referring Provider: Milinda Pointer, MD  DOB: 16-Mar-1941  PCP: [facility]  DOS: 01/09/2016  Note by: Kathlen Brunswick. Dossie Arbour, MD  Service setting: Ambulatory outpatient  Location: [facility] (AMB) Pain Management Facility  Visit type: Procedure  Specialty: Interventional Pain Management  Patient type: Established   Primary Reason for Visit: Interventional Pain Management Treatment. CC: Back Pain (lower)  Procedure:  Anesthesia, Analgesia, Anxiolysis:  Type: Therapeutic Superior-lateral, Superior-medial, and Inferior-medial, Genicular Nerve Radiofrequency Ablation. Region: Lateral, Anterior, and Medial aspects of the knee joint, above and below the knee joint proper. Level: Superior and inferior to the knee joint. Laterality: Left  The patient has failed to respond to conservative therapies including over-the-counter medications, anti-inflammatories, muscle relaxants, membrane stabilizers, opioids, physical therapy, modalities such as heat and ice, as well as more invasive techniques such as nerve blocks. The patient did attained more than 50% relief of the pain from a series of diagnostic injections conducted in separate occasions.  Type: Moderate (Conscious) Sedation & Local Anesthesia Local Anesthetic: Lidocaine 1% Route: Intravenous (IV) IV Access: Secured Sedation: Meaningful verbal contact was maintained at all times during the procedure  Indication(s): Analgesia & Anxiolysis  Indications: 1. Chronic knee pain (Location of Primary Source of Pain) (Bilateral) (L>R)   2. Chronic knee pain (S/P TKR) (Right)   3. Primary osteoarthritis of left knee   4. Chronic pain   5. Acute postoperative pain     Pain Score: Pre-procedure: 1 /10 Post-procedure: 0-No pain/10  Pre-Procedure Assessment  Alice [name] is a 75 y.o. year old, female patient, seen today for interventional treatment. She has Anxiety; Carpal tunnel syndrome;  Depression; Fibromyalgia; GERD (gastroesophageal reflux disease); History of dysphagia; Hypothyroidism; Hyperlipidemia; Hypertension; Migraine headache; Neuralgia; No transfusions per religious beliefs; Osteoarthritis (arthritis due to wear and tear of joints); Chronic pain; Chronic low back pain (Location of Tertiary source of pain) (Bilateral) (L>R); Chronic lower extremity pain (Bilateral) (L>R); Chronic knee pain (Location of Primary Source of Pain) (Bilateral) (L>R); Chronic hip pain (Location of Secondary source of pain) (Bilateral) (L>R); 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); Lumbar facet syndrome (Bilateral) (L>R); Chronic sacroiliac joint pain (Bilateral) (L>R); Long term current use of opiate analgesic; Long term prescription opiate use; Opiate use; Long term current use of non-steroidal anti-inflammatories (NSAID); Disturbance of skin sensation; Diverticulosis; Neurogenic pain; 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 Her primarily concern today is the Back Pain (lower)   Pain Type: Chronic pain Pain Location: Back Pain Orientation: Lower Pain Descriptors / Indicators: Aching, Sharp, Discomfort, Constant Pain Frequency: Constant  Date of Last Visit: 01/03/16 Service Provided on Last Visit: Med Refill  Coagulation Parameters Lab Results  Component Value Date   INR 1.0 10/21/2013   LABPROT 13.5 10/21/2013   APTT 34.8 10/21/2013   PLT 202 10/13/2015    Verification of the correct person, correct site (including marking of  site), and correct procedure were performed and confirmed by the patient.  Consent: Secured. Under the influence of no sedatives a written informed consent was obtained, after having provided information on the risks and possible complications. To fulfill  our ethical and legal obligations, as recommended by the American Medical Association's Code of Ethics, we have provided information to the patient about our clinical impression; the nature and purpose of the treatment or procedure; the risks, benefits, and possible complications of the intervention; alternatives; the risk(s) and benefit(s) of the alternative treatment(s) or procedure(s); and the risk(s) and benefit(s) of doing nothing. The patient was provided information about the risks and possible complications associated with the procedure. These include, but are not limited to, failure to achieve desired goals, infection, bleeding, organ or nerve damage, allergic reactions, paralysis, and death. In the case of intra- or periarticular procedures these may include, but are not limited to, failure to achieve desired goals, infection, bleeding (hemarthrosis), organ or nerve damage, allergic reactions, and death. In addition, the patient was informed that Medicine is not an exact science; therefore, there is also the possibility of unforeseen risks and possible complications that may result in a catastrophic outcome. The patient indicated having understood very clearly. We have given the patient no guarantees and we have made no promises. Enough time was given to the patient to ask questions, all of which were answered to the patient's satisfaction.  Consent Attestation: I, the ordering provider, attest that I have discussed with the patient the benefits, risks, side-effects, alternatives, likelihood of achieving goals, and potential problems during recovery for the procedure that I have provided informed consent.  Pre-Procedure Preparation: Safety  Precautions: Allergies reviewed. Appropriate site, procedure, and patient were confirmed by following the Joint Commission's Universal Protocol (UP.01.01.01), in the form of a "Time Out". The patient was asked to confirm marked site and procedure, before commencing. The patient was asked about blood thinners, or active infections, both of which were denied. Patient was assessed for positional comfort and all pressure points were checked before starting procedure. Allergies: She is allergic to demerol [meperidine].. Infection Control Precautions: Sterile technique used. Standard Universal Precautions were taken as recommended by the Department of [facility] for Disease Control and Prevention (CDC). Standard pre-surgical skin prep was conducted. Respiratory hygiene and cough etiquette was practiced. Hand hygiene observed. Safe injection practices and needle disposal techniques followed. SDV (single dose vial) medications used. Medications properly checked for expiration dates and contaminants. Personal protective equipment (PPE) used: Sterile Radiation-resistant gloves. Monitoring:  As per clinic protocol. Vitals:   01/09/16 1117 01/09/16 1127 01/09/16 1137 01/09/16 1147  BP: (!) 193/99 (!) 152/89 (!) 151/73 (!) 160/96  Pulse: 67 66 68 67  Resp: '14 14 12 17  '$ Temp:  (!) 96.6 F (35.9 C)    TempSrc:      SpO2: 94% 93% 95% 97%  Weight:      Height:      Calculated BMI: Body mass index is 36.84 kg/m.  Description of Procedure Process:   Time-out: "Time-out" completed before starting procedure, as per protocol. Position: Supine Target Area: For Genicular Nerve block(s), the targets are: the superior-lateral genicular nerve, located in the lateral distal portion of the femoral shaft as it curves to form the lateral epicondyle, in the region of the distal femoral metaphysis; the superior-medial genicular nerve, located in the medial distal portion of the femoral shaft as it curves to form the  medial epicondyle; and the inferior-medial genicular nerve, located in the medial, proximal portion of the tibial shaft, as it curves to form the medial epicondyle, in the region of the proximal tibial metaphysis. Approach: Anterior, ipsilateral approach. Area Prepped: Entire knee area, from mid-thigh to mid-shin, lateral,  anterior, and medial aspects. Prepping solution: ChloraPrep (2% chlorhexidine gluconate and 70% isopropyl alcohol) Safety Precautions: Aspiration looking for blood return was conducted prior to all injections. At no point did we inject any substances, as a needle was being advanced. No attempts were made at seeking any paresthesias. Safe injection practices and needle disposal techniques used. Medications properly checked for expiration dates. SDV (single dose vial) medications used.   Description of the Procedure: Protocol guidelines were followed. The patient was placed in position over the fluoroscopy table. The target area was identified and the area prepped in the usual manner. Skin & deeper tissues infiltrated with local anesthetic. Appropriate amount of time allowed to pass for local anesthetics to take effect. Radiofrequency needles were introduced to the target area using fluoroscopy. Using the a Radiofrequency Generator, sensory stimulation using 50 Hz was used to locate & identify the nerve, making sure that the needle was positioned such that there was no sensory stimulation below 0.3 V or above 0.7 V. Stimulation using 2 Hz was used to evaluate the motor component. Care was taken not to lesion any nerves that demonstrated motor stimulation at an output of less than 2.5 times that of the sensory threshold, or a maximum of 2.0 V. Once satisfactory placement of the needles was achieved, the anesthetic was slowly injected, using intermittent aspiration to confirm negative flow of heme. After waiting for 2 minutes, the ablation was performed at 80 degrees C for 60 seconds.The needles  were then removed and the area cleansed, making sure to leave some of the prepping solution back to take advantage of its long term bactericidal properties. EBL: None Materials & Medications Used:  Needle(s) Used: 22g - 10cm, Teflon-coated, Radiofrequency needle(s) Solution Injected: 0.2% PF-Ropivacaine (73m) + SDV-DepoMedrol 80 mg/ml (128m Medications Administered today: We administered fentaNYL, lactated ringers, midazolam, methylPREDNISolone acetate, lidocaine (PF), and ropivacaine (PF) 2 mg/ml (0.2%).Please see chart orders for dosing details.  Imaging Guidance:  Type of Imaging Technique: Fluoroscopy Guidance (Non-spinal) Indication(s): Assistance in needle guidance and placement for procedures requiring needle placement in or near specific anatomical locations not easily accessible without such assistance. Exposure Time: Please see nurses notes. Contrast: None required. Fluoroscopic Guidance: I was personally present in the fluoroscopy suite, where the patient was placed in position for the procedure, over the fluoroscopy-compatible table. Fluoroscopy was manipulated, using "Tunnel Vision Technique", to obtain the best possible view of the target area, on the affected side. Parallax error was corrected before commencing the procedure. A "direction-depth-direction" technique was used to introduce the needle under continuous pulsed fluoroscopic guidance. Once the target was reached, antero-posterior, oblique, and lateral fluoroscopic projection views were taken to confirm needle placement in all planes. Permanently recorded images stored by scanning into EMR. Interpretation: No contrast injected. Intraoperative imaging interpretation by performing Physician. Adequate needle placement confirmed. Needle placement confirmed in AP, lateral, & Oblique Views. Permanent hardcopy images in multiple planes scanned into the patient's record.  Antibiotic Prophylaxis:  Indication(s): No indications  identified. Type:  Antibiotics Given (last 72 hours)    None       Post-operative Assessment:  Complications: No immediate post-treatment complications were observed. Disposition: The patient was discharged home, once institutional criteria were met. Return to clinic in 2 weeks for follow-up evaluation and interpretation of results. The patient tolerated the entire procedure well. A repeat set of vitals were taken after the procedure and the patient was kept under observation following institutional policy, for this type of procedure. Post-procedural neurological assessment was  performed, showing return to baseline, prior to discharge. The patient was provided with post-procedure discharge instructions, including a section on how to identify potential problems. Should any problems arise concerning this procedure, the patient was given instructions to immediately contact us, at any time, without hesitation. In any case, we plan to contact the patient by telephone for a follow-up status report regarding this interventional procedure. Comments:  No additional relevant information.  Plan of Care   Problem List Items Addressed This Visit      High   Chronic knee pain (Location of Primary Source of Pain) (Bilateral) (L>R) - Primary (Chronic)   Relevant Medications   fentaNYL (SUBLIMAZE) injection 25-50 mcg   lactated ringers infusion 1,000 mL (Completed)   midazolam (VERSED) 5 MG/5ML injection 1-2 mg   methylPREDNISolone acetate (DEPO-MEDROL) injection 80 mg (Completed)   lidocaine (PF) (XYLOCAINE) 1 % injection 10 mL (Completed)   ropivacaine (PF) 2 mg/ml (0.2%) (NAROPIN) epidural 9 mL (Completed)   HYDROcodone-acetaminophen (NORCO/VICODIN) 5-325 MG tablet   Other Relevant Orders   DG C-Arm 1-60 Min-No Report (Completed)   Chronic knee pain (S/P TKR) (Right) (Chronic)   Chronic pain (Chronic)   Relevant Medications   fentaNYL (SUBLIMAZE) injection 25-50 mcg   methylPREDNISolone acetate  (DEPO-MEDROL) injection 80 mg (Completed)   lidocaine (PF) (XYLOCAINE) 1 % injection 10 mL (Completed)   ropivacaine (PF) 2 mg/ml (0.2%) (NAROPIN) epidural 9 mL (Completed)   HYDROcodone-acetaminophen (NORCO/VICODIN) 5-325 MG tablet   Osteoarthritis of knee (Left) (Chronic)   Relevant Medications   fentaNYL (SUBLIMAZE) injection 25-50 mcg   methylPREDNISolone acetate (DEPO-MEDROL) injection 80 mg (Completed)   HYDROcodone-acetaminophen (NORCO/VICODIN) 5-325 MG tablet     Unprioritized   Acute postoperative pain   Relevant Medications   HYDROcodone-acetaminophen (NORCO/VICODIN) 5-325 MG tablet    Other Visit Diagnoses   None.     Requested PM Follow-up: Return in about 2 weeks (around 01/23/2016) for Post-Procedure evaluation, In addition (68month for , Med-Mgmt.  Future Appointments Date Time Provider [place] 02/15/2016 9:40 AM FMilinda Pointer MD [facility] None  07/01/2016 9:20 AM [facility]    [facility] Physician: [facility] Note by: Avonne Berkery A. NDossie Arbour M.D, DABA, DABAPM, DABPM, DABIPP, FIPP  Disclaimer:  Medicine is not an exact science. The only guarantee in medicine is that nothing is guaranteed. It is important to note that the decision to proceed with this intervention was based on the information collected from the patient. The Data and conclusions were drawn from the patient's questionnaire, the interview, and the physical examination. Because the information was provided in large part by the patient, it cannot be guaranteed that it has not been purposely or unconsciously manipulated. Every effort has been made to obtain as much relevant data as possible for this evaluation. It is important to note that the conclusions that lead to this procedure are derived in large part from the available data. Always take into account that the treatment will also be dependent on  availability of resources and existing treatment guidelines, considered by other Pain Management Practitioners as being common knowledge and practice, at the time of the intervention. For Medico-Legal purposes, it is also important to point out that variation in procedural techniques and pharmacological choices are the acceptable norm. The indications, contraindications, technique, and results of the above procedure should only be interpreted and judged by a Board-Certified Interventional Pain Specialist with extensive familiarity and expertise in the same  exact procedure and technique. Attempts at providing opinions without similar or greater experience and expertise than that of the treating physician will be considered as inappropriate and unethical, and shall result in a formal complaint to the state medical board and applicable specialty societies.

## 2016-01-10 ENCOUNTER — Telehealth: Payer: Self-pay

## 2016-01-10 NOTE — Telephone Encounter (Signed)
No anwser. Left message to call if needed

## 2016-02-09 ENCOUNTER — Other Ambulatory Visit: Payer: Self-pay | Admitting: Family Medicine

## 2016-02-09 DIAGNOSIS — Z1231 Encounter for screening mammogram for malignant neoplasm of breast: Secondary | ICD-10-CM

## 2016-02-13 ENCOUNTER — Ambulatory Visit: Admission: RE | Admit: 2016-02-13 | Payer: Medicare Other | Source: Ambulatory Visit

## 2016-02-15 ENCOUNTER — Ambulatory Visit: Payer: Medicare Other | Attending: Pain Medicine | Admitting: Pain Medicine

## 2016-02-15 ENCOUNTER — Encounter: Payer: Self-pay | Admitting: Pain Medicine

## 2016-02-15 VITALS — BP 160/77 | HR 74 | Temp 98.2°F | Resp 16 | Ht 61.0 in | Wt 195.0 lb

## 2016-02-15 DIAGNOSIS — I10 Essential (primary) hypertension: Secondary | ICD-10-CM | POA: Insufficient documentation

## 2016-02-15 DIAGNOSIS — M5441 Lumbago with sciatica, right side: Secondary | ICD-10-CM | POA: Diagnosis not present

## 2016-02-15 DIAGNOSIS — M5416 Radiculopathy, lumbar region: Secondary | ICD-10-CM

## 2016-02-15 DIAGNOSIS — F329 Major depressive disorder, single episode, unspecified: Secondary | ICD-10-CM | POA: Diagnosis not present

## 2016-02-15 DIAGNOSIS — Z96651 Presence of right artificial knee joint: Secondary | ICD-10-CM | POA: Insufficient documentation

## 2016-02-15 DIAGNOSIS — G8929 Other chronic pain: Secondary | ICD-10-CM | POA: Diagnosis not present

## 2016-02-15 DIAGNOSIS — M1990 Unspecified osteoarthritis, unspecified site: Secondary | ICD-10-CM | POA: Insufficient documentation

## 2016-02-15 DIAGNOSIS — E039 Hypothyroidism, unspecified: Secondary | ICD-10-CM | POA: Diagnosis not present

## 2016-02-15 DIAGNOSIS — M79606 Pain in leg, unspecified: Secondary | ICD-10-CM | POA: Diagnosis not present

## 2016-02-15 DIAGNOSIS — K5790 Diverticulosis of intestine, part unspecified, without perforation or abscess without bleeding: Secondary | ICD-10-CM | POA: Diagnosis not present

## 2016-02-15 DIAGNOSIS — M542 Cervicalgia: Secondary | ICD-10-CM | POA: Diagnosis not present

## 2016-02-15 DIAGNOSIS — F419 Anxiety disorder, unspecified: Secondary | ICD-10-CM | POA: Diagnosis not present

## 2016-02-15 DIAGNOSIS — M25559 Pain in unspecified hip: Secondary | ICD-10-CM | POA: Diagnosis not present

## 2016-02-15 DIAGNOSIS — G8918 Other acute postprocedural pain: Secondary | ICD-10-CM | POA: Insufficient documentation

## 2016-02-15 DIAGNOSIS — M47896 Other spondylosis, lumbar region: Secondary | ICD-10-CM | POA: Insufficient documentation

## 2016-02-15 DIAGNOSIS — G894 Chronic pain syndrome: Secondary | ICD-10-CM | POA: Insufficient documentation

## 2016-02-15 DIAGNOSIS — M47892 Other spondylosis, cervical region: Secondary | ICD-10-CM | POA: Diagnosis not present

## 2016-02-15 DIAGNOSIS — M1288 Other specific arthropathies, not elsewhere classified, other specified site: Secondary | ICD-10-CM

## 2016-02-15 DIAGNOSIS — Z87891 Personal history of nicotine dependence: Secondary | ICD-10-CM | POA: Insufficient documentation

## 2016-02-15 DIAGNOSIS — Z888 Allergy status to other drugs, medicaments and biological substances status: Secondary | ICD-10-CM | POA: Diagnosis not present

## 2016-02-15 DIAGNOSIS — Z79891 Long term (current) use of opiate analgesic: Secondary | ICD-10-CM | POA: Diagnosis not present

## 2016-02-15 DIAGNOSIS — Z79899 Other long term (current) drug therapy: Secondary | ICD-10-CM | POA: Diagnosis not present

## 2016-02-15 DIAGNOSIS — M79605 Pain in left leg: Secondary | ICD-10-CM | POA: Diagnosis not present

## 2016-02-15 DIAGNOSIS — M533 Sacrococcygeal disorders, not elsewhere classified: Secondary | ICD-10-CM | POA: Diagnosis not present

## 2016-02-15 DIAGNOSIS — M25561 Pain in right knee: Secondary | ICD-10-CM | POA: Diagnosis not present

## 2016-02-15 DIAGNOSIS — M25552 Pain in left hip: Secondary | ICD-10-CM | POA: Insufficient documentation

## 2016-02-15 DIAGNOSIS — M47816 Spondylosis without myelopathy or radiculopathy, lumbar region: Secondary | ICD-10-CM | POA: Diagnosis not present

## 2016-02-15 DIAGNOSIS — M353 Polymyalgia rheumatica: Secondary | ICD-10-CM | POA: Insufficient documentation

## 2016-02-15 DIAGNOSIS — M797 Fibromyalgia: Secondary | ICD-10-CM | POA: Insufficient documentation

## 2016-02-15 DIAGNOSIS — E7800 Pure hypercholesterolemia, unspecified: Secondary | ICD-10-CM | POA: Insufficient documentation

## 2016-02-15 DIAGNOSIS — K219 Gastro-esophageal reflux disease without esophagitis: Secondary | ICD-10-CM | POA: Insufficient documentation

## 2016-02-15 DIAGNOSIS — Z9889 Other specified postprocedural states: Secondary | ICD-10-CM | POA: Insufficient documentation

## 2016-02-15 DIAGNOSIS — G5600 Carpal tunnel syndrome, unspecified upper limb: Secondary | ICD-10-CM | POA: Diagnosis not present

## 2016-02-15 DIAGNOSIS — M25562 Pain in left knee: Secondary | ICD-10-CM | POA: Insufficient documentation

## 2016-02-15 NOTE — Patient Instructions (Signed)
Sacroiliac (SI) Joint Injection Patient Information  Description: The sacroiliac joint connects the scrum (very low back and tailbone) to the ilium (a pelvic bone which also forms half of the hip joint).  Normally this joint experiences very little motion.  When this joint becomes inflamed or unstable low back and or hip and pelvis pain may result.  Injection of this joint with local anesthetics (numbing medicines) and steroids can provide diagnostic information and reduce pain.  This injection is performed with the aid of x-ray guidance into the tailbone area while you are lying on your stomach.   You may experience an electrical sensation down the leg while this is being done.  You may also experience numbness.  We also may ask if we are reproducing your normal pain during the injection.  Conditions which may be treated SI injection:   Low back, buttock, hip or leg pain  Preparation for the Injection:  1. Do not eat any solid food or dairy products within 8 hours of your appointment.  2. You may drink clear liquids up to 3 hours before appointment.  Clear liquids include water, black coffee, juice or soda.  No milk or cream please. 3. You may take your regular medications, including pain medications with a sip of water before your appointment.  Diabetics should hold regular insulin (if take separately) and take 1/2 normal NPH dose the morning of the procedure.  Carry some sugar containing items with you to your appointment. 4. A driver must accompany you and be prepared to drive you home after your procedure. 5. Bring all of your current medications with you. 6. An IV may be inserted and sedation may be given at the discretion of the physician. 7. A blood pressure cuff, EKG and other monitors will often be applied during the procedure.  Some patients may need to have extra oxygen administered for a short period.  8. You will be asked to provide medical information, including your allergies,  prior to the procedure.  We must know immediately if you are taking blood thinners (like Coumadin/Warfarin) or if you are allergic to IV iodine contrast (dye).  We must know if you could possible be pregnant.  Possible side effects:   Bleeding from needle site  Infection (rare, may require surgery)  Nerve injury (rare)  Numbness & tingling (temporary)  A brief convulsion or seizure  Light-headedness (temporary)  Pain at injection site (several days)  Decreased blood pressure (temporary)  Weakness in the leg (temporary)   Call if you experience:   New onset weakness or numbness of an extremity below the injection site that last more than 8 hours.  Hives or difficulty breathing ( go to the emergency room)  Inflammation or drainage at the injection site  Any new symptoms which are concerning to you  Please note:  Although the local anesthetic injected can often make your back/ hip/ buttock/ leg feel good for several hours after the injections, the pain will likely return.  It takes 3-7 days for steroids to work in the sacroiliac area.  You may not notice any pain relief for at least that one week.  If effective, we will often do a series of three injections spaced 3-6 weeks apart to maximally decrease your pain.  After the initial series, we generally will wait some months before a repeat injection of the same type.  If you have any questions, please call [phone] [facility]  Epidural Steroid Injection  Patient Information  Description: The epidural space surrounds the nerves as they exit the spinal cord.  In some patients, the nerves can be compressed and inflamed by a bulging disc or a tight spinal canal (spinal stenosis).  By injecting steroids into the epidural space, we can bring irritated nerves into direct contact with a potentially helpful medication.  These steroids act directly on the irritated nerves and can reduce swelling  and inflammation which often leads to decreased pain.  Epidural steroids may be injected anywhere along the spine and from the neck to the low back depending upon the location of your pain.   After numbing the skin with local anesthetic (like Novocaine), a small needle is passed into the epidural space slowly.  You may experience a sensation of pressure while this is being done.  The entire block usually last less than 10 minutes.  Conditions which may be treated by epidural steroids:   Low back and leg pain  Neck and arm pain  Spinal stenosis  Post-laminectomy syndrome  Herpes zoster (shingles) pain  Pain from compression fractures  Preparation for the injection:  9. Do not eat any solid food or dairy products within 8 hours of your appointment.  10. You may drink clear liquids up to 3 hours before appointment.  Clear liquids include water, black coffee, juice or soda.  No milk or cream please. 11. You may take your regular medication, including pain medications, with a sip of water before your appointment  Diabetics should hold regular insulin (if taken separately) and take 1/2 normal NPH dos the morning of the procedure.  Carry some sugar containing items with you to your appointment. 12. A driver must accompany you and be prepared to drive you home after your procedure.  68. Bring all your current medications with your. 14. An IV may be inserted and sedation may be given at the discretion of the physician.   15. A blood pressure cuff, EKG and other monitors will often be applied during the procedure.  Some patients may need to have extra oxygen administered for a short period. 45. You will be asked to provide medical information, including your allergies, prior to the procedure.  We must know immediately if you are taking blood thinners (like Coumadin/Warfarin)  Or if you are allergic to IV iodine contrast (dye). We must know if you could possible be pregnant.  Possible  side-effects:  Bleeding from needle site  Infection (rare, may require surgery)  Nerve injury (rare)  Numbness & tingling (temporary)  Difficulty urinating (rare, temporary)  Spinal headache ( a headache worse with upright posture)  Light -headedness (temporary)  Pain at injection site (several days)  Decreased blood pressure (temporary)  Weakness in arm/leg (temporary)  Pressure sensation in back/neck (temporary)  Call if you experience:  Fever/chills associated with headache or increased back/neck pain.  Headache worsened by an upright position.  New onset weakness or numbness of an extremity below the injection site  Hives or difficulty breathing (go to the emergency room)  Inflammation or drainage at the infection site  Severe back/neck pain  Any new symptoms which are concerning to you  Please note:  Although the local anesthetic injected can often make your back or neck feel good for several hours after the injection, the pain will likely return.  It takes 3-7 days for steroids to work in the epidural space.  You may not notice any pain relief for at least that one week.  If effective,  we will often do a series of three injections spaced 3-6 weeks apart to maximally decrease your pain.  After the initial series, we generally will wait several months before considering a repeat injection of the same type.  If you have any questions, please call [phone] [facility]  What are the risk, side effects and possible complications? Generally speaking, most procedures are safe.  However, with any procedure there are risks, side effects, and the possibility of complications.  The risks and complications are dependent upon the sites that are lesioned, or the type of nerve block to be performed.  The closer the procedure is to the spine, the more serious the risks are.  Great care is taken when placing  the radio frequency needles, block needles or lesioning probes, but sometimes complications can occur. 1. Infection: Any time there is an injection through the skin, there is a risk of infection.  This is why sterile conditions are used for these blocks.  There are four possible types of infection. 1. Localized skin infection. 2. Central Nervous System Infection-This can be in the form of Meningitis, which can be deadly. 3. Epidural Infections-This can be in the form of an epidural abscess, which can cause pressure inside of the spine, causing compression of the spinal cord with subsequent paralysis. This would require an emergency surgery to decompress, and there are no guarantees that the patient would recover from the paralysis. 4. Discitis-This is an infection of the intervertebral discs.  It occurs in about 1% of discography procedures.  It is difficult to treat and it may lead to surgery.        2. Pain: the needles have to go through skin and soft tissues, will cause soreness.       3. Damage to internal structures:  The nerves to be lesioned may be near blood vessels or    other nerves which can be potentially damaged.       4. Bleeding: Bleeding is more common if the patient is taking blood thinners such as  aspirin, Coumadin, Ticiid, Plavix, etc., or if he/she have some genetic predisposition  such as hemophilia. Bleeding into the spinal canal can cause compression of the spinal  cord with subsequent paralysis.  This would require an emergency surgery to  decompress and there are no guarantees that the patient would recover from the  paralysis.       5. Pneumothorax:  Puncturing of a lung is a possibility, every time a needle is introduced in  the area of the chest or upper back.  Pneumothorax refers to free air around the  collapsed lung(s), inside of the thoracic cavity (chest cavity).  Another two possible  complications related to a similar event would include: Hemothorax and Chylothorax.    These are variations of the Pneumothorax, where instead of air around the collapsed  lung(s), you may have blood or chyle, respectively.       6. Spinal headaches: They may occur with any procedures in the area of the spine.       7. Persistent CSF (Cerebro-Spinal Fluid) leakage: This is a rare problem, but may occur  with prolonged intrathecal or epidural catheters either due to the formation of a fistulous  track or a dural tear.       8. Nerve damage: By working so close to the spinal cord, there is always a possibility of  nerve damage, which could be as serious  as a permanent spinal cord injury with  paralysis.       9. Death:  Although rare, severe deadly allergic reactions known as "Anaphylactic  reaction" can occur to any of the medications used.      10. Worsening of the symptoms:  We can always make thing worse.  What are the chances of something like this happening? Chances of any of this occuring are extremely low.  By statistics, you have more of a chance of getting killed in a motor vehicle accident: while driving to the hospital than any of the above occurring .  Nevertheless, you should be aware that they are possibilities.  In general, it is similar to taking a shower.  Everybody knows that you can slip, hit your head and get killed.  Does that mean that you should not shower again?  Nevertheless always keep in mind that statistics do not mean anything if you happen to be on the wrong side of them.  Even if a procedure has a 1 (one) in a 1,000,000 (million) chance of going wrong, it you happen to be that one..Also, keep in mind that by statistics, you have more of a chance of having something go wrong when taking medications.  Who should not have this procedure? If you are on a blood thinning medication (e.g. Coumadin, Plavix, see list of "Blood Thinners"), or if you have an active infection going on, you should not have the procedure.  If you are taking any blood thinners, please inform  your physician.  How should I prepare for this procedure?  Do not eat or drink anything at least six hours prior to the procedure.  Bring a driver with you .  It cannot be a taxi.  Come accompanied by an adult that can drive you back, and that is strong enough to help you if your legs get weak or numb from the local anesthetic.  Take all of your medicines the morning of the procedure with just enough water to swallow them.  If you have diabetes, make sure that you are scheduled to have your procedure done first thing in the morning, whenever possible.  If you have diabetes, take only half of your insulin dose and notify our nurse that you have done so as soon as you arrive at the clinic.  If you are diabetic, but only take blood sugar pills (oral hypoglycemic), then do not take them on the morning of your procedure.  You may take them after you have had the procedure.  Do not take aspirin or any aspirin-containing medications, at least eleven (11) days prior to the procedure.  They may prolong bleeding.  Wear loose fitting clothing that may be easy to take off and that you would not mind if it got stained with Betadine or blood.  Do not wear any jewelry or perfume  Remove any nail coloring.  It will interfere with some of our monitoring equipment.  NOTE: Remember that this is not meant to be interpreted as a complete list of all possible complications.  Unforeseen problems may occur.  BLOOD THINNERS The following drugs contain aspirin or other products, which can cause increased bleeding during surgery and should not be taken for 2 weeks prior to and 1 week after surgery.  If you should need take something for relief of minor pain, you may take acetaminophen which is found in Tylenol,m Datril, Anacin-3 and Panadol. It is not blood thinner. The products listed below are.  Do  not take any of the products listed below in addition to any listed on your instruction sheet.  A.P.C or A.P.C  with Codeine Codeine Phosphate Capsules #3 Ibuprofen Ridaura  ABC compound Congesprin Imuran rimadil  Advil Cope Indocin Robaxisal  Alka-Seltzer Effervescent Pain Reliever and Antacid Coricidin or Coricidin-D  Indomethacin Rufen  Alka-Seltzer plus Cold Medicine Cosprin Ketoprofen S-A-C Tablets  Anacin Analgesic Tablets or Capsules Coumadin Korlgesic Salflex  Anacin Extra Strength Analgesic tablets or capsules CP-2 Tablets Lanoril Salicylate  Anaprox Cuprimine Capsules Levenox Salocol  Anexsia-D Dalteparin Magan Salsalate  Anodynos Darvon compound Magnesium Salicylate Sine-off  Ansaid Dasin Capsules Magsal Sodium Salicylate  Anturane Depen Capsules Marnal Soma  APF Arthritis pain formula Dewitt's Pills Measurin Stanback  Argesic Dia-Gesic Meclofenamic Sulfinpyrazone  Arthritis Bayer Timed Release Aspirin Diclofenac Meclomen Sulindac  Arthritis pain formula Anacin Dicumarol Medipren Supac  Analgesic (Safety coated) Arthralgen Diffunasal Mefanamic Suprofen  Arthritis Strength Bufferin Dihydrocodeine Mepro Compound Suprol  Arthropan liquid Dopirydamole Methcarbomol with Aspirin Synalgos  ASA tablets/Enseals Disalcid Micrainin Tagament  Ascriptin Doan's Midol Talwin  Ascriptin A/D Dolene Mobidin Tanderil  Ascriptin Extra Strength Dolobid Moblgesic Ticlid  Ascriptin with Codeine Doloprin or Doloprin with Codeine Momentum Tolectin  Asperbuf Duoprin Mono-gesic Trendar  Aspergum Duradyne Motrin or Motrin IB Triminicin  Aspirin plain, buffered or enteric coated Durasal Myochrisine Trigesic  Aspirin Suppositories Easprin Nalfon Trillsate  Aspirin with Codeine Ecotrin Regular or Extra Strength Naprosyn Uracel  Atromid-S Efficin Naproxen Ursinus  Auranofin Capsules Elmiron Neocylate Vanquish  Axotal Emagrin Norgesic Verin  Azathioprine Empirin or Empirin with Codeine Normiflo Vitamin E  Azolid Emprazil Nuprin Voltaren  Bayer Aspirin plain, buffered or children's or timed BC Tablets or powders  Encaprin Orgaran Warfarin Sodium  Buff-a-Comp Enoxaparin Orudis Zorpin  Buff-a-Comp with Codeine Equegesic Os-Cal-Gesic   Buffaprin Excedrin plain, buffered or Extra Strength Oxalid   Bufferin Arthritis Strength Feldene Oxphenbutazone   Bufferin plain or Extra Strength Feldene Capsules Oxycodone with Aspirin   Bufferin with Codeine Fenoprofen Fenoprofen Pabalate or Pabalate-SF   Buffets II Flogesic Panagesic   Buffinol plain or Extra Strength Florinal or Florinal with Codeine Panwarfarin   Buf-Tabs Flurbiprofen Penicillamine   Butalbital Compound Four-way cold tablets Penicillin   Butazolidin Fragmin Pepto-Bismol   Carbenicillin Geminisyn Percodan   Carna Arthritis Reliever Geopen Persantine   Carprofen Gold's salt Persistin   Chloramphenicol Goody's Phenylbutazone   Chloromycetin Haltrain Piroxlcam   Clmetidine heparin Plaquenil   Cllnoril Hyco-pap Ponstel   Clofibrate Hydroxy chloroquine Propoxyphen         Before stopping any of these medications, be sure to consult the physician who ordered them.  Some, such as Coumadin (Warfarin) are ordered to prevent or treat serious conditions such as "deep thrombosis", "pumonary embolisms", and other heart problems.  The amount of time that you may need off of the medication may also vary with the medication and the reason for which you were taking it.  If you are taking any of these medications, please make sure you notify your pain physician before you undergo any procedures.

## 2016-02-15 NOTE — Progress Notes (Signed)
Nursing Pain Medication Assessment:  Safety precautions to be maintained throughout the outpatient stay will include: orient to surroundings, keep bed in low position, maintain call bell within reach at all times, provide assistance with transfer out of bed and ambulation.  Medication Inspection Compliance: Pill count conducted under aseptic conditions, in front of the patient. Neither the pills nor the bottle was removed from the patient's sight at any time. Once count was completed pills were immediately returned to the patient in their original bottle.  Medication: Hydrocodone/APAP Pill Count: 21 of 60 pills remain Bottle Appearance: Standard pharmacy container. Clearly labeled. Filled Date: 08 / 21/ 2017 Medication last intake:02/15/2016

## 2016-02-15 NOTE — Progress Notes (Signed)
Patient's Name: Alice [name]  MRN: [id number]  Referring Provider: Jerrilyn Cairo Primary [facility]*  DOB: 1940-12-25  PCP: Dione Housekeeper, MD  DOS: 02/15/2016  Note by: Sydnee Levans. Laban Emperor, MD  Service setting: Ambulatory outpatient  Specialty: Interventional Pain Management  Location: [facility] (AMB) Pain Management Facility    Patient type: Established   Primary Reason(s) for Visit: Encounter for post-procedure evaluation of chronic illness with mild to moderate exacerbation CC: Knee Pain (left); Hip Pain (left); and Back Pain (lower)  HPI  Alice [name] is a 75 y.o. year old, female patient, who comes today for an initial evaluation. She has Anxiety; Carpal tunnel syndrome; Depression; Fibromyalgia; GERD (gastroesophageal reflux disease); History of dysphagia; Hypothyroidism; Hyperlipidemia; Hypertension; Migraine headache; Neuralgia; No transfusions per religious beliefs; Osteoarthritis (arthritis due to wear and tear of joints); Chronic pain; Chronic low back pain (Location of Tertiary source of pain) (Bilateral) (L>R); Chronic lower extremity pain (Bilateral) (L>R); Chronic knee pain (Location of Primary Source of Pain) (Bilateral) (L>R); Chronic hip pain (Location of Secondary source of pain) (Bilateral) (L>R); 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); Lumbar facet syndrome (Bilateral) (L>R); Chronic sacroiliac joint pain (Bilateral) (L>R); Long term current use of opiate analgesic; Long term prescription opiate use; Opiate use; Long term current use of non-steroidal anti-inflammatories (NSAID); Disturbance of skin sensation; Diverticulosis; Neurogenic pain; 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 Her primarily concern today is the Knee Pain (left); Hip Pain (left); and Back Pain (lower)  Pain Assessment: Self-Reported Pain Score: 5 /10             Reported level is compatible with observation.       Pain Type: Chronic pain Pain Location: Knee Pain Orientation: Left Pain Descriptors / Indicators: Aching, Constant, Radiating, Stabbing (stabbing pain in the knee especially when standing and walking, when sitting down  it hurts some but the pain eases) Pain Frequency: Constant  [name] comes in today for post-procedure evaluation after the treatment done on 01/09/2016.  Further details on both, my assessment(s), as well as the proposed treatment plan, please see below.  Post-Procedure Assessment  01/09/2016 Procedure: Left Genicular nerve radiofrequency ablation under fluoroscopic guidance and IV sedation Influential Factors: BMI: 36.84 kg/m Intra-procedural challenges: None observed Assessment challenges: None detected         Post-procedural side-effects, adverse reactions, or complications: None reported Reported issues: None  Sedation: Sedation provided. When no sedatives are used, the analgesic levels obtained are directly associated to the effectiveness of the local anesthetics. However, when sedation is provided, the level of analgesia obtained during the initial 1 hour following the intervention, is believed to be the result of a combination of factors. These factors may include, but are not limited to: 1. The effectiveness of the local anesthetics used. 2. The effects of the analgesic(s) and/or anxiolytic(s) used. 3. The degree of discomfort experienced by the patient at the time of the procedure. 4. The patients ability and reliability in recalling and recording the events. 5. The presence and influence of possible  secondary gains and/or psychosocial factors. Reported result: Relief experienced during the 1st hour after the procedure: 100 % (  Ultra-Short Term Relief) Interpretative annotation: Analgesia during this period is likely to be Local Anesthetic and/or IV Sedative (Analgesic/Anxiolitic) related.          Effects of local anesthetic: The analgesic effects attained during this period are directly associated to the localized infiltration of local anesthetics and therefore cary significant diagnostic value as to the etiological location, or anatomical origin, of the pain. Expected duration of relief is directly dependent on the pharmacodynamics of the local anesthetic used. Long-acting (4-6 hours) anesthetics used.  Reported result: Relief during the next 4 to 6 hour after the procedure: 100 % (Short-Term Relief) Interpretative annotation: Complete relief would suggest area to be the source of the pain.          Long-term benefit: Defined as the period of time past the expected duration of local anesthetics. With the possible exception of prolonged sympathetic blockade from the local anesthetics, benefits during this period are typically attributed to, or associated with, other factors such as analgesic sensory neuropraxia, antiinflammatory effects, or beneficial biochemical changes provided by agents other than the local anesthetics Reported result: Extended relief following procedure: 50 % (the first week, had 100% relief, and as time progressed the pain started back at a 1/10 and  went to 5/10; cannot stand for long periods of time) (Long-Term Relief) Interpretative annotation: Good relief. Possible therapeutic success.          Current benefits: Defined as persistent relief that continues at this point in time.   Reported results: Treated area: 50 % In addition, the patient reports improvement in function Interpretative annotation: Long-term benefit would suggest adequate RF ablation  Interpretation:  Results would suggest adequate radiofrequency ablation.           Laboratory Chemistry  Inflammation Markers Lab Results  Component Value Date   ESRSEDRATE 10 11/23/2015   CRP 0.6 11/23/2015   Renal Function Lab Results  Component Value Date   BUN 13 11/23/2015   CREATININE 0.60 11/23/2015   GFRAA >60 11/23/2015   GFRNONAA >60 11/23/2015   Hepatic Function Lab Results  Component Value Date   AST 21 11/23/2015   ALT 16 11/23/2015   ALBUMIN 4.0 11/23/2015   Electrolytes Lab Results  Component Value Date   NA 140 11/23/2015   K 3.9 11/23/2015   CL 108 11/23/2015   CALCIUM 9.2 11/23/2015   MG 1.9 11/23/2015   Pain Modulating Vitamins Lab Results  Component Value Date   25OHVITD1 40 11/23/2015   25OHVITD2 8.5 11/23/2015   25OHVITD3 31 11/23/2015   VITAMINB12 271 11/23/2015   Coagulation Parameters Lab Results  Component Value Date   INR 1.0 10/21/2013   LABPROT 13.5 10/21/2013   APTT 34.8 10/21/2013   PLT 202 10/13/2015   Cardiovascular Lab Results  Component Value Date   HGB 15.9 10/13/2015   HCT 44.7 10/13/2015   Note: Lab results reviewed.  Recent Diagnostic Imaging Review  Dg C-arm 1-60 Min-no Report  Result Date: 01/09/2016 CLINICAL DATA: Sub-acute chronic pain syndrome C-ARM 1-60 MINUTES Fluoroscopy was utilized by the requesting physician.  No radiographic interpretation.   Note: Imaging results reviewed.  Meds  The patient has a current medication list which includes the following prescription(s): [facility] d3, gabapentin, meloxicam, paroxetine, ranitidine, simvastatin, and hydrocodone-acetaminophen, and the following Facility-Administered Medications: fentanyl, lactated ringers, lidocaine (pf), lidocaine (pf), methylprednisolone acetate, midazolam, ropivacaine (pf) 2 mg/ml (0.2%), sodium chloride flush, and triamcinolone acetonide.  Current Outpatient Prescriptions on File Prior to Visit  Medication Sig  .  [facility] D3 5000 units capsule TAKE 5,000 UNITS  BY MOUTH ONCE DAILY.  Marland Kitchen gabapentin (NEURONTIN) 300 MG capsule Take 300 mg by mouth 3 (three) times daily.  . meloxicam (MOBIC) 15 MG tablet Take 1 tablet (15 mg total) by mouth daily.  Marland Kitchen PARoxetine (PAXIL) 40 MG tablet Take 40 mg by mouth daily.  . ranitidine (ZANTAC) 150 MG capsule Take 150 mg by mouth 2 (two) times daily.  . simvastatin (ZOCOR) 20 MG tablet Take by mouth 2 (two) times daily.   Marland Kitchen HYDROcodone-acetaminophen (NORCO/VICODIN) 5-325 MG tablet Take 1 tablet by mouth every 8 (eight) hours as needed for severe pain (for post-RF pain).   No current facility-administered medications on file prior to visit.    ROS  Constitutional: Denies any fever or chills Gastrointestinal: No reported hemesis, hematochezia, vomiting, or acute GI distress Musculoskeletal: Denies any acute onset joint swelling, redness, loss of ROM, or weakness Neurological: No reported episodes of acute onset apraxia, aphasia, dysarthria, agnosia, amnesia, paralysis, loss of coordination, or loss of consciousness  Allergies  Ms. Plaisted is allergic to demerol [meperidine].  PFSH  Drug: Ms. Laver  reports that she does not use drugs. Alcohol:  reports that she does not drink alcohol. Tobacco:  reports that she has quit smoking. She has never used smokeless tobacco. Medical:  has a past medical history of Anxiety; Arthritis; Depression; Fibromyalgia; GERD (gastroesophageal reflux disease); History of carpal tunnel syndrome; History of diverticulitis; History of vertigo; Hypercholesteremia; Hypertension; Migraine; Neuralgia (2014); Obesity; and Polymyalgia rheumatica (HCC). Family: family history includes Arthritis in her father, mother, paternal grandfather, and sister; COPD in her brother and father; Cancer in her brother and son; Diabetes in her sister; Drug abuse in her son; Stroke in her maternal grandmother.  Past Surgical History:  Procedure Laterality Date  . ABDOMINAL HYSTERECTOMY    . BREAST CYST ASPIRATION  Right +/- 10 yrs ago   x 2  . HERNIA REPAIR    . JOINT REPLACEMENT Right    knee 2015  . THYROIDECTOMY     Constitutional Exam  General appearance: Well nourished, well developed, and well hydrated. In no apparent acute distress Vitals:   02/15/16 0937  BP: (!) 160/77  Pulse: 74  Resp: 16  Temp: 98.2 F (36.8 C)  TempSrc: Oral  SpO2: 99%  Weight: 195 lb (88.5 kg)  Height: 5\' 1"  (1.549 m)   BMI Assessment: Estimated body mass index is 36.84 kg/m as calculated from the following:   Height as of this encounter: 5\' 1"  (1.549 m).   Weight as of this encounter: 195 lb (88.5 kg).  BMI interpretation table: BMI level Category Range association with higher incidence of chronic pain  <18 kg/m2 Underweight   18.5-24.9 kg/m2 Ideal body weight   25-29.9 kg/m2 Overweight Increased incidence by 20%  30-34.9 kg/m2 Obese (Class I) Increased incidence by 68%  35-39.9 kg/m2 Severe obesity (Class II) Increased incidence by 136%  >40 kg/m2 Extreme obesity (Class III) Increased incidence by 254%   BMI Readings from Last 4 Encounters:  02/21/16 35.67 kg/m  02/15/16 36.84 kg/m  01/09/16 36.84 kg/m  01/03/16 36.84 kg/m   Wt Readings from Last 4 Encounters:  02/21/16 195 lb (88.5 kg)  02/15/16 195 lb (88.5 kg)  01/09/16 195 lb (88.5 kg)  01/03/16 195 lb (88.5 kg)  Psych/Mental status: Alert, oriented x 3 (person, place, & time) Eyes: PERLA Respiratory: No evidence of acute respiratory distress  Cervical Spine Exam  Inspection: No masses, redness, or  swelling Alignment: Symmetrical Functional ROM: Unrestricted ROM Stability: No instability detected Muscle strength & Tone: Functionally intact Sensory: Unimpaired Palpation: Non-contributory  Upper Extremity (UE) Exam    Side: Right upper extremity  Side: Left upper extremity  Inspection: No masses, redness, swelling, or asymmetry  Inspection: No masses, redness, swelling, or asymmetry  Functional ROM: Unrestricted ROM          Functional ROM: Unrestricted ROM          Muscle strength & Tone: Functionally intact  Muscle strength & Tone: Functionally intact  Sensory: Unimpaired  Sensory: Unimpaired  Palpation: Non-contributory  Palpation: Non-contributory   Thoracic Spine Exam  Inspection: No masses, redness, or swelling Alignment: Symmetrical Functional ROM: Unrestricted ROM Stability: No instability detected Sensory: Unimpaired Muscle strength & Tone: Functionally intact Palpation: Non-contributory  Lumbar Spine Exam  Inspection: No masses, redness, or swelling Alignment: Symmetrical Functional ROM: Unrestricted ROM Stability: No instability detected Muscle strength & Tone: Functionally intact Sensory: Unimpaired Palpation: Non-contributory Provocative Tests: Lumbar Hyperextension and rotation test: evaluation deferred today       Patrick's Maneuver: evaluation deferred today              Gait & Posture Assessment  Ambulation: Unassisted Gait: Relatively normal for age and body habitus Posture: WNL   Lower Extremity Exam    Side: Right lower extremity  Side: Left lower extremity  Inspection: No masses, redness, swelling, or asymmetry  Inspection: No masses, redness, swelling, or asymmetry  Functional ROM: Unrestricted ROM          Functional ROM: Unrestricted ROM          Muscle strength & Tone: Functionally intact  Muscle strength & Tone: Functionally intact  Sensory: Unimpaired  Sensory: Unimpaired  Palpation: Non-contributory  Palpation: Non-contributory   Assessment  Primary Diagnosis & Pertinent Problem List: The primary encounter diagnosis was Chronic sacroiliac joint pain (Bilateral) (L>R). Diagnoses of Chronic low back pain with right-sided sciatica, unspecified back pain laterality, Chronic pain of left lower extremity, Chronic knee pain (Location of Primary Source of Pain) (Bilateral) (L>R), Chronic hip pain, unspecified laterality, Chronic lumbar radicular pain (Left L5) (Bilateral)  (L>R), Lumbar facet syndrome (Bilateral) (L>R), and Lumbar spondylosis were also pertinent to this visit.  Visit Diagnosis: 1. Chronic sacroiliac joint pain (Bilateral) (L>R)   2. Chronic low back pain with right-sided sciatica, unspecified back pain laterality   3. Chronic pain of left lower extremity   4. Chronic knee pain (Location of Primary Source of Pain) (Bilateral) (L>R)   5. Chronic hip pain, unspecified laterality   6. 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)   7. Lumbar facet syndrome (Bilateral) (L>R)   8. Lumbar spondylosis    Plan of Care  Pharmacotherapy (Medications Ordered): No orders of the defined types were placed in this encounter.  New Prescriptions   No medications on file   Medications administered during this visit: Ms. Dorvil had no medications administered during this visit. Lab-work, Procedure(s), & Referral(s) Ordered: Orders Placed This Encounter  Procedures  . Lumbar Epidural Injection  . SACROILIAC JOINT INJECTINS   Imaging & Referral(s) Ordered: None  Interventional Therapies: Pending/Scheduled/Planned:   Diagnostic left L2-3 lumbar epidural steroid injection + diagnostic left SI joint block under fluoroscopic guidance and IV sedation. Left Genicular RFA done on 01/09/16, pending right side RFA.   Considering:   Diagnostic right knee genicular nerve block under fluoroscopic guidance, with or without sedation. Diagnostic left intra-articular knee injection with local anesthetic, without fluoroscopy  or IV sedation. Possible series of 5 left intra-articular knee Hyalgan injections, without fluoroscopy or IV sedation. Possible diagnostic left genicular nerve block under fluoroscopic guidance, with or without sedation. Possible bilateral genicular nerve radiofrequency ablation under fluoroscopic guidance and IV sedation. Diagnostic bilateral lumbar facet block under fluoroscopic guidance and IV sedation. Possible bilateral lumbar  facet radiofrequency ablation under fluoroscopic guidance and IV sedation the pending on the results of the diagnostic injection.  Diagnostic left sided L4-5 lumbar epidural steroid injection under fluoroscopic guidance, with or without sedation.    PRN Procedures:   Diagnostic right knee genicular nerve block under fluoroscopic guidance, with or without sedation. Diagnostic left intra-articular knee injection with local anesthetic, without fluoroscopy or IV sedation. Diagnostic bilateral lumbar facet block under fluoroscopic guidance and IV sedation. Diagnostic left sided L4-5 lumbar epidural steroid injection under fluoroscopic guidance, with or without sedation.    Requested PM Follow-up: Return for Schedule Procedure, (ASAP).  Future Appointments Date Time Provider Department Center  03/22/2016 8:15 AM Delano Metz, MD [facility] None  06/27/2016 9:30 AM Delano Metz, MD [facility] None   Primary Care Physician: Dione Housekeeper, MD Location: [facility] Outpatient Pain Management Facility Note by: Sydnee Levans. Laban Emperor, M.D, DABA, DABAPM, DABPM, DABIPP, FIPP  Pain Score Disclaimer: We use the NRS-11 scale. This is a self-reported, subjective measurement of pain severity with only modest accuracy. It is used primarily to identify changes within a particular patient. It must be understood that outpatient pain scales are significantly less accurate that those used for research, where they can be applied under ideal controlled circumstances with minimal exposure to variables. In reality, the score is likely to be a combination of pain intensity and pain affect, where pain affect describes the degree of emotional arousal or changes in action readiness caused by the sensory experience of pain. Factors such as social and work situation, setting, emotional state, anxiety levels, expectation, and prior pain experience may influence pain perception and show large inter-individual differences that  may also be affected by time variables.  Patient instructions provided during this appointment: Patient Instructions   Sacroiliac (SI) Joint Injection Patient Information  Description: The sacroiliac joint connects the scrum (very low back and tailbone) to the ilium (a pelvic bone which also forms half of the hip joint).  Normally this joint experiences very little motion.  When this joint becomes inflamed or unstable low back and or hip and pelvis pain may result.  Injection of this joint with local anesthetics (numbing medicines) and steroids can provide diagnostic information and reduce pain.  This injection is performed with the aid of x-ray guidance into the tailbone area while you are lying on your stomach.   You may experience an electrical sensation down the leg while this is being done.  You may also experience numbness.  We also may ask if we are reproducing your normal pain during the injection.  Conditions which may be treated SI injection:   Low back, buttock, hip or leg pain  Preparation for the Injection:  1. Do not eat any solid food or dairy products within 8 hours of your appointment.  2. You may drink clear liquids up to 3 hours before appointment.  Clear liquids include water, black coffee, juice or soda.  No milk or cream please. 3. You may take your regular medications, including pain medications with a sip of water before your appointment.  Diabetics should hold regular insulin (if take separately) and take 1/2 normal NPH dose the morning  of the procedure.  Carry some sugar containing items with you to your appointment. 4. A driver must accompany you and be prepared to drive you home after your procedure. 5. Bring all of your current medications with you. 6. An IV may be inserted and sedation may be given at the discretion of the physician. 7. A blood pressure cuff, EKG and other monitors will often be applied during the procedure.  Some patients may need to have extra  oxygen administered for a short period.  8. You will be asked to provide medical information, including your allergies, prior to the procedure.  We must know immediately if you are taking blood thinners (like Coumadin/Warfarin) or if you are allergic to IV iodine contrast (dye).  We must know if you could possible be pregnant.  Possible side effects:   Bleeding from needle site  Infection (rare, may require surgery)  Nerve injury (rare)  Numbness & tingling (temporary)  A brief convulsion or seizure  Light-headedness (temporary)  Pain at injection site (several days)  Decreased blood pressure (temporary)  Weakness in the leg (temporary)   Call if you experience:   New onset weakness or numbness of an extremity below the injection site that last more than 8 hours.  Hives or difficulty breathing ( go to the emergency room)  Inflammation or drainage at the injection site  Any new symptoms which are concerning to you  Please note:  Although the local anesthetic injected can often make your back/ hip/ buttock/ leg feel good for several hours after the injections, the pain will likely return.  It takes 3-7 days for steroids to work in the sacroiliac area.  You may not notice any pain relief for at least that one week.  If effective, we will often do a series of three injections spaced 3-6 weeks apart to maximally decrease your pain.  After the initial series, we generally will wait some months before a repeat injection of the same type.  If you have any questions, please call [phone] [facility]  Epidural Steroid Injection Patient Information  Description: The epidural space surrounds the nerves as they exit the spinal cord.  In some patients, the nerves can be compressed and inflamed by a bulging disc or a tight spinal canal (spinal stenosis).  By injecting steroids into the epidural space, we can bring irritated nerves into direct  contact with a potentially helpful medication.  These steroids act directly on the irritated nerves and can reduce swelling and inflammation which often leads to decreased pain.  Epidural steroids may be injected anywhere along the spine and from the neck to the low back depending upon the location of your pain.   After numbing the skin with local anesthetic (like Novocaine), a small needle is passed into the epidural space slowly.  You may experience a sensation of pressure while this is being done.  The entire block usually last less than 10 minutes.  Conditions which may be treated by epidural steroids:   Low back and leg pain  Neck and arm pain  Spinal stenosis  Post-laminectomy syndrome  Herpes zoster (shingles) pain  Pain from compression fractures  Preparation for the injection:  9. Do not eat any solid food or dairy products within 8 hours of your appointment.  10. You may drink clear liquids up to 3 hours before appointment.  Clear liquids include water, black coffee, juice or soda.  No milk or cream please. 11. You  may take your regular medication, including pain medications, with a sip of water before your appointment  Diabetics should hold regular insulin (if taken separately) and take 1/2 normal NPH dos the morning of the procedure.  Carry some sugar containing items with you to your appointment. 12. A driver must accompany you and be prepared to drive you home after your procedure.  13. Bring all your current medications with your. 14. An IV may be inserted and sedation may be given at the discretion of the physician.   15. A blood pressure cuff, EKG and other monitors will often be applied during the procedure.  Some patients may need to have extra oxygen administered for a short period. 16. You will be asked to provide medical information, including your allergies, prior to the procedure.  We must know immediately if you are taking blood thinners (like Coumadin/Warfarin)   Or if you are allergic to IV iodine contrast (dye). We must know if you could possible be pregnant.  Possible side-effects:  Bleeding from needle site  Infection (rare, may require surgery)  Nerve injury (rare)  Numbness & tingling (temporary)  Difficulty urinating (rare, temporary)  Spinal headache ( a headache worse with upright posture)  Light -headedness (temporary)  Pain at injection site (several days)  Decreased blood pressure (temporary)  Weakness in arm/leg (temporary)  Pressure sensation in back/neck (temporary)  Call if you experience:  Fever/chills associated with headache or increased back/neck pain.  Headache worsened by an upright position.  New onset weakness or numbness of an extremity below the injection site  Hives or difficulty breathing (go to the emergency room)  Inflammation or drainage at the infection site  Severe back/neck pain  Any new symptoms which are concerning to you  Please note:  Although the local anesthetic injected can often make your back or neck feel good for several hours after the injection, the pain will likely return.  It takes 3-7 days for steroids to work in the epidural space.  You may not notice any pain relief for at least that one week.  If effective, we will often do a series of three injections spaced 3-6 weeks apart to maximally decrease your pain.  After the initial series, we generally will wait several months before considering a repeat injection of the same type.  If you have any questions, please call [phone] [facility]  What are the risk, side effects and possible complications? Generally speaking, most procedures are safe.  However, with any procedure there are risks, side effects, and the possibility of complications.  The risks and complications are dependent upon the sites that are lesioned, or the type of nerve block to be  performed.  The closer the procedure is to the spine, the more serious the risks are.  Great care is taken when placing the radio frequency needles, block needles or lesioning probes, but sometimes complications can occur. 1. Infection: Any time there is an injection through the skin, there is a risk of infection.  This is why sterile conditions are used for these blocks.  There are four possible types of infection. 1. Localized skin infection. 2. Central Nervous System Infection-This can be in the form of Meningitis, which can be deadly. 3. Epidural Infections-This can be in the form of an epidural abscess, which can cause pressure inside of the spine, causing compression of the spinal cord with subsequent paralysis. This would require an emergency surgery to decompress,  and there are no guarantees that the patient would recover from the paralysis. 4. Discitis-This is an infection of the intervertebral discs.  It occurs in about 1% of discography procedures.  It is difficult to treat and it may lead to surgery.        2. Pain: the needles have to go through skin and soft tissues, will cause soreness.       3. Damage to internal structures:  The nerves to be lesioned may be near blood vessels or    other nerves which can be potentially damaged.       4. Bleeding: Bleeding is more common if the patient is taking blood thinners such as  aspirin, Coumadin, Ticiid, Plavix, etc., or if he/she have some genetic predisposition  such as hemophilia. Bleeding into the spinal canal can cause compression of the spinal  cord with subsequent paralysis.  This would require an emergency surgery to  decompress and there are no guarantees that the patient would recover from the  paralysis.       5. Pneumothorax:  Puncturing of a lung is a possibility, every time a needle is introduced in  the area of the chest or upper back.  Pneumothorax refers to free air around the  collapsed lung(s), inside of the thoracic cavity  (chest cavity).  Another two possible  complications related to a similar event would include: Hemothorax and Chylothorax.   These are variations of the Pneumothorax, where instead of air around the collapsed  lung(s), you may have blood or chyle, respectively.       6. Spinal headaches: They may occur with any procedures in the area of the spine.       7. Persistent CSF (Cerebro-Spinal Fluid) leakage: This is a rare problem, but may occur  with prolonged intrathecal or epidural catheters either due to the formation of a fistulous  track or a dural tear.       8. Nerve damage: By working so close to the spinal cord, there is always a possibility of  nerve damage, which could be as serious as a permanent spinal cord injury with  paralysis.       9. Death:  Although rare, severe deadly allergic reactions known as "Anaphylactic  reaction" can occur to any of the medications used.      10. Worsening of the symptoms:  We can always make thing worse.  What are the chances of something like this happening? Chances of any of this occuring are extremely low.  By statistics, you have more of a chance of getting killed in a motor vehicle accident: while driving to the hospital than any of the above occurring .  Nevertheless, you should be aware that they are possibilities.  In general, it is similar to taking a shower.  Everybody knows that you can slip, hit your head and get killed.  Does that mean that you should not shower again?  Nevertheless always keep in mind that statistics do not mean anything if you happen to be on the wrong side of them.  Even if a procedure has a 1 (one) in a 1,000,000 (million) chance of going wrong, it you happen to be that one..Also, keep in mind that by statistics, you have more of a chance of having something go wrong when taking medications.  Who should not have this procedure? If you are on a blood thinning medication (e.g. Coumadin, Plavix, see list of "Blood Thinners"), or if  you have  an active infection going on, you should not have the procedure.  If you are taking any blood thinners, please inform your physician.  How should I prepare for this procedure?  Do not eat or drink anything at least six hours prior to the procedure.  Bring a driver with you .  It cannot be a taxi.  Come accompanied by an adult that can drive you back, and that is strong enough to help you if your legs get weak or numb from the local anesthetic.  Take all of your medicines the morning of the procedure with just enough water to swallow them.  If you have diabetes, make sure that you are scheduled to have your procedure done first thing in the morning, whenever possible.  If you have diabetes, take only half of your insulin dose and notify our nurse that you have done so as soon as you arrive at the clinic.  If you are diabetic, but only take blood sugar pills (oral hypoglycemic), then do not take them on the morning of your procedure.  You may take them after you have had the procedure.  Do not take aspirin or any aspirin-containing medications, at least eleven (11) days prior to the procedure.  They may prolong bleeding.  Wear loose fitting clothing that may be easy to take off and that you would not mind if it got stained with Betadine or blood.  Do not wear any jewelry or perfume  Remove any nail coloring.  It will interfere with some of our monitoring equipment.  NOTE: Remember that this is not meant to be interpreted as a complete list of all possible complications.  Unforeseen problems may occur.  BLOOD THINNERS The following drugs contain aspirin or other products, which can cause increased bleeding during surgery and should not be taken for 2 weeks prior to and 1 week after surgery.  If you should need take something for relief of minor pain, you may take acetaminophen which is found in Tylenol,m Datril, Anacin-3 and Panadol. It is not blood thinner. The products listed  below are.  Do not take any of the products listed below in addition to any listed on your instruction sheet.  A.P.C or A.P.C with Codeine Codeine Phosphate Capsules #3 Ibuprofen Ridaura  ABC compound Congesprin Imuran rimadil  Advil Cope Indocin Robaxisal  Alka-Seltzer Effervescent Pain Reliever and Antacid Coricidin or Coricidin-D  Indomethacin Rufen  Alka-Seltzer plus Cold Medicine Cosprin Ketoprofen S-A-C Tablets  Anacin Analgesic Tablets or Capsules Coumadin Korlgesic Salflex  Anacin Extra Strength Analgesic tablets or capsules CP-2 Tablets Lanoril Salicylate  Anaprox Cuprimine Capsules Levenox Salocol  Anexsia-D Dalteparin Magan Salsalate  Anodynos Darvon compound Magnesium Salicylate Sine-off  Ansaid Dasin Capsules Magsal Sodium Salicylate  Anturane Depen Capsules Marnal Soma  APF Arthritis pain formula Dewitt's Pills Measurin Stanback  Argesic Dia-Gesic Meclofenamic Sulfinpyrazone  Arthritis Bayer Timed Release Aspirin Diclofenac Meclomen Sulindac  Arthritis pain formula Anacin Dicumarol Medipren Supac  Analgesic (Safety coated) Arthralgen Diffunasal Mefanamic Suprofen  Arthritis Strength Bufferin Dihydrocodeine Mepro Compound Suprol  Arthropan liquid Dopirydamole Methcarbomol with Aspirin Synalgos  ASA tablets/Enseals Disalcid Micrainin Tagament  Ascriptin Doan's Midol Talwin  Ascriptin A/D Dolene Mobidin Tanderil  Ascriptin Extra Strength Dolobid Moblgesic Ticlid  Ascriptin with Codeine Doloprin or Doloprin with Codeine Momentum Tolectin  Asperbuf Duoprin Mono-gesic Trendar  Aspergum Duradyne Motrin or Motrin IB Triminicin  Aspirin plain, buffered or enteric coated Durasal Myochrisine Trigesic  Aspirin Suppositories Easprin Nalfon Trillsate  Aspirin with Codeine Ecotrin Regular  or Extra Strength Naprosyn Uracel  Atromid-S Efficin Naproxen Ursinus  Auranofin Capsules Elmiron Neocylate Vanquish  Axotal Emagrin Norgesic Verin  Azathioprine Empirin or Empirin with Codeine  Normiflo Vitamin E  Azolid Emprazil Nuprin Voltaren  Bayer Aspirin plain, buffered or children's or timed BC Tablets or powders Encaprin Orgaran Warfarin Sodium  Buff-a-Comp Enoxaparin Orudis Zorpin  Buff-a-Comp with Codeine Equegesic Os-Cal-Gesic   Buffaprin Excedrin plain, buffered or Extra Strength Oxalid   Bufferin Arthritis Strength Feldene Oxphenbutazone   Bufferin plain or Extra Strength Feldene Capsules Oxycodone with Aspirin   Bufferin with Codeine Fenoprofen Fenoprofen Pabalate or Pabalate-SF   Buffets II Flogesic Panagesic   Buffinol plain or Extra Strength Florinal or Florinal with Codeine Panwarfarin   Buf-Tabs Flurbiprofen Penicillamine   Butalbital Compound Four-way cold tablets Penicillin   Butazolidin Fragmin Pepto-Bismol   Carbenicillin Geminisyn Percodan   Carna Arthritis Reliever Geopen Persantine   Carprofen Gold's salt Persistin   Chloramphenicol Goody's Phenylbutazone   Chloromycetin Haltrain Piroxlcam   Clmetidine heparin Plaquenil   Cllnoril Hyco-pap Ponstel   Clofibrate Hydroxy chloroquine Propoxyphen         Before stopping any of these medications, be sure to consult the physician who ordered them.  Some, such as Coumadin (Warfarin) are ordered to prevent or treat serious conditions such as "deep thrombosis", "pumonary embolisms", and other heart problems.  The amount of time that you may need off of the medication may also vary with the medication and the reason for which you were taking it.  If you are taking any of these medications, please make sure you notify your pain physician before you undergo any procedures.

## 2016-02-19 ENCOUNTER — Ambulatory Visit
Admission: RE | Admit: 2016-02-19 | Discharge: 2016-02-19 | Disposition: A | Discharge status: Home / Self Care | Payer: Medicare Other | Source: Ambulatory Visit | Attending: Family Medicine | Admitting: Family Medicine

## 2016-02-19 DIAGNOSIS — Z1231 Encounter for screening mammogram for malignant neoplasm of breast: Secondary | ICD-10-CM | POA: Insufficient documentation

## 2016-02-20 ENCOUNTER — Telehealth: Payer: Self-pay | Admitting: *Deleted

## 2016-02-20 NOTE — Telephone Encounter (Signed)
Patient states she is returning call. Cannot find any documentation of why we would have called her. Patient has appointment tomorrow at [phone].

## 2016-02-21 ENCOUNTER — Ambulatory Visit (HOSPITAL_BASED_OUTPATIENT_CLINIC_OR_DEPARTMENT_OTHER): Payer: Medicare Other | Admitting: Pain Medicine

## 2016-02-21 ENCOUNTER — Ambulatory Visit
Admission: RE | Admit: 2016-02-21 | Discharge: 2016-02-21 | Disposition: A | Discharge status: Home / Self Care | Payer: Medicare Other | Source: Ambulatory Visit | Attending: Pain Medicine | Admitting: Pain Medicine

## 2016-02-21 ENCOUNTER — Encounter: Payer: Self-pay | Admitting: Pain Medicine

## 2016-02-21 VITALS — BP 158/88 | HR 75 | Temp 97.6°F | Resp 15 | Ht 62.0 in | Wt 195.0 lb

## 2016-02-21 DIAGNOSIS — M25552 Pain in left hip: Secondary | ICD-10-CM | POA: Insufficient documentation

## 2016-02-21 DIAGNOSIS — M545 Low back pain: Secondary | ICD-10-CM

## 2016-02-21 DIAGNOSIS — Z888 Allergy status to other drugs, medicaments and biological substances status: Secondary | ICD-10-CM | POA: Diagnosis not present

## 2016-02-21 DIAGNOSIS — M47816 Spondylosis without myelopathy or radiculopathy, lumbar region: Secondary | ICD-10-CM

## 2016-02-21 DIAGNOSIS — M5416 Radiculopathy, lumbar region: Secondary | ICD-10-CM | POA: Insufficient documentation

## 2016-02-21 DIAGNOSIS — M533 Sacrococcygeal disorders, not elsewhere classified: Secondary | ICD-10-CM | POA: Diagnosis present

## 2016-02-21 DIAGNOSIS — G8929 Other chronic pain: Secondary | ICD-10-CM | POA: Diagnosis not present

## 2016-02-21 DIAGNOSIS — M47896 Other spondylosis, lumbar region: Secondary | ICD-10-CM | POA: Insufficient documentation

## 2016-02-21 MED ORDER — FENTANYL CITRATE (PF) 100 MCG/2ML IJ SOLN
INTRAMUSCULAR | Status: AC
  Administered 2016-02-21: 50 ug
  Filled 2016-02-21: qty 2

## 2016-02-21 MED ORDER — ROPIVACAINE HCL 2 MG/ML IJ SOLN
INTRAMUSCULAR | Status: AC
  Administered 2016-02-21: 09:00:00
  Filled 2016-02-21: qty 10

## 2016-02-21 MED ORDER — METHYLPREDNISOLONE ACETATE 80 MG/ML IJ SUSP
INTRAMUSCULAR | Status: AC
  Administered 2016-02-21: 09:00:00
  Filled 2016-02-21: qty 1

## 2016-02-21 MED ORDER — ROPIVACAINE HCL 2 MG/ML IJ SOLN: 2.0000 mL | Freq: Once | INTRAMUSCULAR | Status: DC

## 2016-02-21 MED ORDER — TRIAMCINOLONE ACETONIDE 40 MG/ML IJ SUSP: 40.0000 mg | Freq: Once | INTRAMUSCULAR | Status: DC

## 2016-02-21 MED ORDER — LIDOCAINE HCL (PF) 1 % IJ SOLN
INTRAMUSCULAR | Status: AC
  Administered 2016-02-21: 09:00:00
  Filled 2016-02-21: qty 5

## 2016-02-21 MED ORDER — TRIAMCINOLONE ACETONIDE 40 MG/ML IJ SUSP
INTRAMUSCULAR | Status: AC
  Administered 2016-02-21: 09:00:00
  Filled 2016-02-21: qty 1

## 2016-02-21 MED ORDER — METHYLPREDNISOLONE ACETATE 80 MG/ML IJ SUSP: 80.0000 mg | Freq: Once | INTRAMUSCULAR | Status: DC

## 2016-02-21 MED ORDER — ROPIVACAINE HCL 2 MG/ML IJ SOLN
4.0000 mL | Freq: Once | INTRAMUSCULAR | Status: AC
  Administered 2016-02-21: 4 mL

## 2016-02-21 MED ORDER — IOPAMIDOL (ISOVUE-M 200) INJECTION 41%
10.0000 mL | Freq: Once | INTRAMUSCULAR | Status: AC
  Administered 2016-02-21: 10 mL via EPIDURAL
  Filled 2016-02-21: qty 10

## 2016-02-21 MED ORDER — LIDOCAINE HCL (PF) 1 % IJ SOLN: 10.0000 mL | Freq: Once | INTRAMUSCULAR | Status: DC

## 2016-02-21 MED ORDER — LACTATED RINGERS IV SOLN: 1000.0000 mL | Freq: Once | INTRAVENOUS | Status: DC

## 2016-02-21 MED ORDER — FENTANYL CITRATE (PF) 100 MCG/2ML IJ SOLN: 25.0000 ug | INTRAMUSCULAR | Status: DC | PRN

## 2016-02-21 MED ORDER — MIDAZOLAM HCL 5 MG/5ML IJ SOLN: 1.0000 mg | INTRAMUSCULAR | Status: DC | PRN

## 2016-02-21 MED ORDER — MIDAZOLAM HCL 5 MG/5ML IJ SOLN
INTRAMUSCULAR | Status: AC
  Administered 2016-02-21: 2 mg
  Filled 2016-02-21: qty 5

## 2016-02-21 MED ORDER — SODIUM CHLORIDE 0.9% FLUSH: 2.0000 mL | Freq: Once | INTRAVENOUS | Status: DC

## 2016-02-21 NOTE — Progress Notes (Signed)
Patient's Name: Alice [name]  MRN: [id number]  Referring Provider: Delano Metz, MD  DOB: 11-21-1940  PCP: Dione Housekeeper, MD  DOS: 02/21/2016  Note by: Sydnee Levans. Laban Emperor, MD  Service setting: Ambulatory outpatient  Location: [facility] (AMB) Pain Management Facility  Visit type: Procedure  Specialty: Interventional Pain Management  Patient type: Established   Primary Reason for Visit: Interventional Pain Management Treatment. CC: Hip Pain (left)  Procedure #1:  Anesthesia, Analgesia, Anxiolysis:  Type: Diagnostic Inter-Laminar Epidural Steroid Injection Region: Lumbar Level: L2-3 Level. Laterality: Left-Sided Paramedial  Type: Local Anesthesia with Moderate (Conscious) Sedation Local Anesthetic: Lidocaine 1% Route: Intravenous (IV) IV Access: Secured Sedation: Meaningful verbal contact was maintained at all times during the procedure  Indication(s): Analgesia and Anxiety   Procedure #2:  Anesthesia, Analgesia, Anxiolysis:  Type: Diagnostic Sacroiliac Joint Steroid Injection Region: Superior Lumbosacral Region Level: PSIS (Posterior Superior Iliac Spine) Laterality: Left-Sided  Type: Local Anesthesia with Moderate (Conscious) Sedation Local Anesthetic: Lidocaine 1% Route: Intravenous (IV) IV Access: Secured Sedation: Meaningful verbal contact was maintained at all times during the procedure  Indication(s): Analgesia and Anxiety   Indications: 1. Chronic low back pain (Location of Tertiary source of pain) (Bilateral) (L>R)   2. Chronic sacroiliac joint pain (Bilateral) (L>R)   3. Lumbar spondylosis   4. 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 (year old), female patient, seen today for interventional treatment. She  has a past surgical history that includes Breast cyst aspiration (Right, +/- 10 yrs ago); Abdominal hysterectomy;  Hernia repair; Joint replacement (Right); and Thyroidectomy.. Her primarily concern today is the Hip Pain (left) The primary encounter diagnosis was Chronic low back pain (Location of Tertiary source of pain) (Bilateral) (L>R). 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  Pain Type: Chronic pain Pain Location: Hip Pain Orientation: Left Pain Descriptors / Indicators: Stabbing  Date of Last Visit: 02/19/16 Service Provided on Last Visit: Med Refill  Coagulation Parameters Lab Results  Component Value Date   INR 1.0 10/21/2013   LABPROT 13.5 10/21/2013   APTT 34.8 10/21/2013   PLT 202 10/13/2015   Verification of the correct person, correct site (including marking of site), and correct procedure were performed and confirmed by the patient.  Consent: Before the procedure and under the influence of no sedative(s), amnesic(s), or anxiolytics, the patient was informed of the treatment options, risks and possible complications. To fulfill our ethical and legal obligations, as recommended by the American Medical Association's Code of Ethics, I have informed the patient of my clinical impression; the nature and purpose of the treatment or procedure; the risks, benefits, and possible complications of the intervention; the alternatives, including doing nothing; the risk(s) and benefit(s) of the alternative treatment(s) or procedure(s); and the risk(s) and benefit(s) of doing nothing. The patient was provided information about the general risks and possible complications associated with the procedure. These may include, but are not limited to: failure to achieve desired goals, infection, bleeding, organ or nerve damage, allergic reactions, paralysis, and death. In addition, the patient was informed of those risks and complications associated to Spine-related procedures,  such as failure to decrease pain; infection (i.e.: Meningitis, epidural or intraspinal abscess); bleeding (i.e.: epidural hematoma, subarachnoid hemorrhage, or any other type of intraspinal or peri-dural bleeding); organ  or nerve damage (i.e.: Any type of peripheral nerve, nerve root, or spinal cord injury) with subsequent damage to sensory, motor, and/or autonomic systems, resulting in permanent pain, numbness, and/or weakness of one or several areas of the body; allergic reactions; (i.e.: anaphylactic reaction); and/or death. Furthermore, the patient was informed of those risks and complications associated with the medications. These include, but are not limited to: allergic reactions (i.e.: anaphylactic or anaphylactoid reaction(s)); adrenal axis suppression; blood sugar elevation that in diabetics may result in ketoacidosis or comma; water retention that in patients with history of congestive heart failure may result in shortness of breath, pulmonary edema, and decompensation with resultant heart failure; weight gain; swelling or edema; medication-induced neural toxicity; particulate matter embolism and blood vessel occlusion with resultant organ, and/or nervous system infarction; and/or aseptic necrosis of one or more joints. Finally, the patient was informed that Medicine is not an exact science; therefore, there is also the possibility of unforeseen or unpredictable risks and/or possible complications that may result in a catastrophic outcome. The patient indicated having understood very clearly. We have given the patient no guarantees and we have made no promises. Enough time was given to the patient to ask questions, all of which were answered to the patient's satisfaction. Ms. Alice [name] has indicated that she wanted to continue with the procedure.  Consent Attestation: I, the ordering provider, attest that I have discussed with the patient the benefits, risks, side-effects, alternatives, likelihood of  achieving goals, and potential problems during recovery for the procedure that I have provided informed consent.  Pre-Procedure Preparation:  Safety Precautions: Allergies reviewed. The patient was asked about blood thinners, or active infections, both of which were denied. The patient was asked to confirm the procedure and laterality, before marking the site, and again before commencing the procedure. Appropriate site, procedure, and patient were confirmed by following the Joint Commission's Universal Protocol (UP.01.01.01), in the form of a "Time Out". The patient was asked to participate by confirming the accuracy of the "Time Out" information. Patient was assessed for positional comfort and pressure points before starting the procedure. Allergies: She is allergic to demerol [meperidine]. Allergy Precautions: None required Infection Control Precautions: Sterile technique used. Standard Universal Precautions were taken as recommended by the Department of [facility] for Disease Control and Prevention (CDC). Standard pre-surgical skin prep was conducted. Respiratory hygiene and cough etiquette was practiced. Hand hygiene observed. Safe injection practices and needle disposal techniques followed. SDV (single dose vial) medications used. Medications properly checked for expiration dates and contaminants. Personal protective equipment (PPE) used as per protocol. Monitoring:  As per clinic protocol. Vitals:   02/21/16 0920 02/21/16 0927 02/21/16 0937 02/21/16 0951  BP: (!) 171/103 (!) 156/82 (!) 149/85 (!) 158/88  Pulse: 74 73 70 75  Resp: 13 13 14 15   Temp:    97.6 F (36.4 C)  TempSrc:      SpO2: 95% 93% 94% 95%  Weight:      Height:      Calculated BMI: Body mass index is 35.67 kg/m. Time-out: "Time-out" completed before starting procedure, as per protocol.  Description of Procedure #1 Process:   Position: Prone with head of the table was raised to facilitate breathing. Target  Area: The interlaminar space, initially targeting the lower laminar border of the superior vertebral body. Approach: Paramedial approach. Area Prepped: Entire Posterior Lumbar Region Prepping solution: ChloraPrep (2% chlorhexidine gluconate and 70% isopropyl alcohol) Safety Precautions: Aspiration looking for blood return was conducted prior to  all injections. At no point did we inject any substances, as a needle was being advanced. No attempts were made at seeking any paresthesias. Safe injection practices and needle disposal techniques used. Medications properly checked for expiration dates. SDV (single dose vial) medications used. Description of the Procedure: Protocol guidelines were followed. The procedure needle was introduced through the skin, ipsilateral to the reported pain, and advanced to the target area. Bone was contacted and the needle walked caudad, until the lamina was cleared. The epidural space was identified using "loss-of-resistance technique" with 2-3 ml of PF-NaCl (0.9% NSS), in a 5cc LOR glass syringe. EBL: None Materials & Medications:  Needle(s) Used: 20g - 10cm, Tuohy-style epidural needle Medication(s): see below.  Description of Procedure #2 Process:  Target Area: Superior, posterior, aspect of the sacroiliac fissure Approach: Posterior, paraspinal, ipsilateral approach. Area Prepped: Entire Lower Lumbosacral Region Description of the Procedure: Protocol guidelines were followed. The patient was placed in position over the procedure table. The target area was identified and the area prepped in the usual manner. Skin & deeper tissues infiltrated with local anesthetic. Appropriate amount of time allowed to pass for local anesthetics to take effect. The procedure needle was advanced under fluoroscopic guidance into the sacroiliac joint until a firm endpoint was obtained. Proper needle placement secured. Negative aspiration confirmed. Solution injected in intermittent fashion,  asking for systemic symptoms every 0.5cc of injectate. The needles were then removed and the area cleansed, making sure to leave some of the prepping solution back to take advantage of its long term bactericidal properties. EBL: None Materials & Medications:  Needle(s) Type: Spinal needle(s) Gauge: 22G Length: 3.5-in Medication(s): Ms. Mccandlish had no medications administered during this visit. Please see chart orders for dosing details.  Imaging Guidance for procedure #1 (Spinal):  Type of Imaging Technique: Fluoroscopy Guidance (Spinal) Indication(s): Assistance in needle guidance and placement for procedures requiring needle placement in or near specific anatomical locations not easily accessible without such assistance. Exposure Time: Please see nurses notes. Contrast: Before injecting any contrast, we confirmed that the patient did not have an allergy to iodine, shellfish, or radiological contrast. Once satisfactory needle placement was completed at the desired level, radiological contrast was injected. Contrast injected under live fluoroscopy. No contrast complications. See chart for type and volume of contrast used. Fluoroscopic Guidance: I was personally present during the use of fluoroscopy. "Tunnel Vision Technique" used to obtain the best possible view of the target area. Parallax error corrected before commencing the procedure. "Direction-depth-direction" technique used to introduce the needle under continuous pulsed fluoroscopy. Once target was reached, antero-posterior, oblique, and lateral fluoroscopic projection used confirm needle placement in all planes. Images permanently stored in EMR. Interpretation: I personally interpreted the imaging intraoperatively. Adequate needle placement confirmed in multiple planes. Appropriate spread of contrast into desired area was observed. No evidence of afferent or efferent intravascular uptake. No intrathecal or subarachnoid spread observed.  Permanent images saved into the patient's record.  Imaging Guidance for procedure #2 (Non-Spinal):  Type of Imaging Technique: Fluoroscopy Guidance (Non-Spinal) Indication(s): Assistance in needle guidance and placement for procedures requiring needle placement in or near specific anatomical locations not easily accessible without such assistance. Exposure Time: Please see nurses notes. Contrast: None used. Fluoroscopic Guidance: I was personally present during the use of fluoroscopy. "Tunnel Vision Technique" used to obtain the best possible view of the target area. Parallax error corrected before commencing the procedure. "Direction-depth-direction" technique used to introduce the needle under continuous pulsed fluoroscopy. Once target was  reached, antero-posterior, oblique, and lateral fluoroscopic projection used confirm needle placement in all planes. Images permanently stored in EMR. Interpretation: No contrast injected. I personally interpreted the imaging intraoperatively. Adequate needle placement confirmed in multiple planes. Permanent images saved into the patient's record.  Antibiotic Prophylaxis:  Indication(s): No indications identified. Type:  Antibiotics Given (last 72 hours)    None      Post-operative Assessment:  Complications: No immediate post-treatment complications observed by team, or reported by patient. Disposition: The patient tolerated the entire procedure well. A repeat set of vitals were taken after the procedure and the patient was kept under observation following institutional policy, for this type of procedure. Post-procedural neurological assessment was performed, showing return to baseline, prior to discharge. The patient was provided with post-procedure discharge instructions, including a section on how to identify potential problems. Should any problems arise concerning this procedure, the patient was given instructions to immediately contact us, at any time,  without hesitation. In any case, we plan to contact the patient by telephone for a follow-up status report regarding this interventional procedure. Comments:  No additional relevant information.  Plan of Care  Discharge to: Discharge home  Medications ordered for procedure: Meds ordered this encounter  Medications  . fentaNYL (SUBLIMAZE) injection 25-50 mcg    Make sure Narcan is available in the pyxis when using this medication. In the event of respiratory depression (RR< 8/min): Titrate NARCAN (naloxone) in increments of 0.1 to 0.2 mg IV at 2-3 minute intervals, until desired degree of reversal.  . lactated ringers infusion 1,000 mL  . midazolam (VERSED) 5 MG/5ML injection 1-2 mg    Make sure Flumazenil is available in the pyxis when using this medication. If oversedation occurs, administer 0.2 mg IV over 15 sec. If after 45 sec no response, administer 0.2 mg again over 1 min; may repeat at 1 min intervals; not to exceed 4 doses (1 mg)  . iopamidol (ISOVUE-M) 41 % intrathecal injection 10 mL  . triamcinolone acetonide (KENALOG-40) injection 40 mg  . lidocaine (PF) (XYLOCAINE) 1 % injection 10 mL  . sodium chloride flush (NS) 0.9 % injection 2 mL  . ropivacaine (PF) 2 mg/ml (0.2%) (NAROPIN) epidural 2 mL  . methylPREDNISolone acetate (DEPO-MEDROL) injection 80 mg  . lidocaine (PF) (XYLOCAINE) 1 % injection 10 mL  . ropivacaine (PF) 2 mg/ml (0.2%) (NAROPIN) epidural 4 mL  . ropivacaine (PF) 2 mg/ml (0.2%) (NAROPIN) 2 MG/ML epidural    TICE, KORI: cabinet override  . lidocaine (PF) (XYLOCAINE) 1 % injection    TICE, KORI: cabinet override  . methylPREDNISolone acetate (DEPO-MEDROL) 80 MG/ML injection    TICE, KORI: cabinet override  . triamcinolone acetonide (KENALOG-40) 40 MG/ML injection    TICE, KORI: cabinet override  . fentaNYL (SUBLIMAZE) 100 MCG/2ML injection    TICE, KORI: cabinet override  . midazolam (VERSED) 5 MG/5ML injection    TICE, KORI: cabinet override  . lidocaine  (PF) (XYLOCAINE) 1 % injection    TICE, KORI: cabinet override   Medications administered: (For more details, see medical record) We administered iopamidol, ropivacaine (PF) 2 mg/ml (0.2%), ropivacaine (PF) 2 mg/ml (0.2%), lidocaine (PF), methylPREDNISolone acetate, triamcinolone acetonide, fentaNYL, midazolam, and lidocaine (PF). Lab-work, Procedure(s), & Referral(s) Ordered: Orders Placed This Encounter  Procedures  . Lumbar Epidural Injection  . DG C-Arm 1-60 Min-No Report   Imaging Ordered: Results for orders placed in visit on 01/09/16  DG C-Arm 1-60 Min-No Report   Narrative CLINICAL DATA: Sub-acute chronic pain syndrome  C-ARM 1-60 MINUTES  Fluoroscopy was utilized by the requesting physician.  No radiographic  interpretation.     New Prescriptions   No medications on file   Physician-requested Follow-up:  Return in about 2 weeks (around 03/06/2016) for Post-Procedure evaluation.  Future Appointments Date Time Provider Department Center  03/22/2016 8:15 AM Delano MetzFrancisco Allyanna Appleman, MD [facility] None  06/27/2016 9:30 AM Delano Met[facility]Francisco [facility]   Primary Care Physician: Dione Housekeeperlmedo, Mario Ernesto, MD [facility] Note by: Sydnee LevansFrancisco A. Laban EmperorNaveira, M.D, DABA, DABAPM, DABPM, DABIPP, FIPP  Disclaimer:  Medicine is not an exact science. The only guarantee in medicine is that nothing is guaranteed. It is important to note that the decision to proceed with this intervention was based on the information collected from the patient. The Data and conclusions were drawn from the patient's questionnaire, the interview, and the physical examination. Because the information was provided in large part by the patient, it cannot be guaranteed that it has not been purposely or unconsciously manipulated. Every effort has been made to obtain as much relevant data as possible for this evaluation. It is important to note that the conclusions that lead to this  procedure are derived in large part from the available data. Always take into account that the treatment will also be dependent on availability of resources and existing treatment guidelines, considered by other Pain Management Practitioners as being common knowledge and practice, at the time of the intervention. For Medico-Legal purposes, it is also important to point out that variation in procedural techniques and pharmacological choices are the acceptable norm. The indications, contraindications, technique, and results of the above procedure should only be interpreted and judged by a Board-Certified Interventional Pain Specialist with extensive familiarity and expertise in the same exact procedure and technique. Attempts at providing opinions without similar or greater experience and expertise than that of the treating physician will be considered as inappropriate and unethical, and shall result in a formal complaint to the state medical board and applicable specialty societies.  Instructions provided at this appointment: Patient Instructions  Pain Management Discharge Instructions  General Discharge Instructions :  If you need to reach your doctor call: Monday-Friday 8:00 am - 4:00 pm at [phone] or toll free [phone].  After clinic hours [phone] to have operator reach doctor.  Bring all of your medication bottles to all your appointments in the pain clinic.  To cancel or reschedule your appointment with Pain Management please remember to call 24 hours in advance to avoid a fee.  Refer to the educational materials which you have been given on: General Risks, I had my Procedure. Discharge Instructions, Post Sedation.  Post Procedure Instructions:  The drugs you were given will stay in your system until tomorrow, so for the next 24 hours you should not drive, make any legal decisions or drink any alcoholic beverages.  You may eat anything you prefer, but it is better to start  with liquids then soups and crackers, and gradually work up to solid foods.  Please notify your doctor immediately if you have any unusual bleeding, trouble breathing or pain that is not related to your normal pain.  Depending on the type of procedure that was done, some parts of your body may feel week and/or numb.  This usually clears up by tonight or the next day.  Walk with the use of an assistive device or accompanied by an adult for the 24 hours.  You may use ice on the affected  area for the first 24 hours.  Put ice in a Ziploc bag and cover with a towel and place against area 15 minutes on 15 minutes off.  You may switch to heat after 24 hours.

## 2016-02-21 NOTE — Progress Notes (Signed)
Safety precautions to be maintained throughout the outpatient stay will include: orient to surroundings, keep bed in low position, maintain call bell within reach at all times, provide assistance with transfer out of bed and ambulation.  

## 2016-02-21 NOTE — Patient Instructions (Signed)

## 2016-02-22 ENCOUNTER — Telehealth: Payer: Self-pay

## 2016-02-22 NOTE — Telephone Encounter (Signed)
Post procedure phone call.  Left message.  

## 2016-03-22 ENCOUNTER — Ambulatory Visit: Payer: Medicare Other | Admitting: Pain Medicine

## 2016-04-27 ENCOUNTER — Emergency Department
Admission: EM | Admit: 2016-04-27 | Discharge: 2016-04-27 | Disposition: A | Discharge status: Home / Self Care | Payer: Medicare Other | Attending: Emergency Medicine | Admitting: Emergency Medicine

## 2016-04-27 ENCOUNTER — Encounter: Payer: Self-pay | Admitting: Emergency Medicine

## 2016-04-27 DIAGNOSIS — Z79899 Other long term (current) drug therapy: Secondary | ICD-10-CM | POA: Insufficient documentation

## 2016-04-27 DIAGNOSIS — E039 Hypothyroidism, unspecified: Secondary | ICD-10-CM | POA: Diagnosis not present

## 2016-04-27 DIAGNOSIS — R197 Diarrhea, unspecified: Secondary | ICD-10-CM | POA: Insufficient documentation

## 2016-04-27 DIAGNOSIS — R112 Nausea with vomiting, unspecified: Secondary | ICD-10-CM | POA: Diagnosis not present

## 2016-04-27 DIAGNOSIS — Z791 Long term (current) use of non-steroidal anti-inflammatories (NSAID): Secondary | ICD-10-CM | POA: Insufficient documentation

## 2016-04-27 DIAGNOSIS — Z87891 Personal history of nicotine dependence: Secondary | ICD-10-CM | POA: Insufficient documentation

## 2016-04-27 DIAGNOSIS — I10 Essential (primary) hypertension: Secondary | ICD-10-CM | POA: Insufficient documentation

## 2016-04-27 LAB — CBC WITH DIFFERENTIAL/PLATELET
Basophils Absolute: 0.1 10*3/uL (ref 0–0.1)
Basophils Relative: 1 %
EOS ABS: 0.2 10*3/uL (ref 0–0.7)
EOS PCT: 2 %
HCT: 44.3 % (ref 35.0–47.0)
Hemoglobin: 15.7 g/dL (ref 12.0–16.0)
LYMPHS ABS: 1.2 10*3/uL (ref 1.0–3.6)
LYMPHS PCT: 15 %
MCH: 33.2 pg (ref 26.0–34.0)
MCHC: 35.5 g/dL (ref 32.0–36.0)
MCV: 93.5 fL (ref 80.0–100.0)
MONO ABS: 0.5 10*3/uL (ref 0.2–0.9)
MONOS PCT: 6 %
Neutro Abs: 6.2 10*3/uL (ref 1.4–6.5)
Neutrophils Relative %: 76 %
PLATELETS: 196 10*3/uL (ref 150–440)
RBC: 4.73 MIL/uL (ref 3.80–5.20)
RDW: 12.6 % (ref 11.5–14.5)
WBC: 8.2 10*3/uL (ref 3.6–11.0)

## 2016-04-27 LAB — URINALYSIS, COMPLETE (UACMP) WITH MICROSCOPIC
BACTERIA UA: NONE SEEN
BILIRUBIN URINE: NEGATIVE
Glucose, UA: 50 mg/dL — AB
Hgb urine dipstick: NEGATIVE
Ketones, ur: NEGATIVE mg/dL
Leukocytes, UA: NEGATIVE
Nitrite: NEGATIVE
PH: 8 (ref 5.0–8.0)
Protein, ur: NEGATIVE mg/dL
SPECIFIC GRAVITY, URINE: 1.011 (ref 1.005–1.030)

## 2016-04-27 LAB — COMPREHENSIVE METABOLIC PANEL
ALBUMIN: 4.4 g/dL (ref 3.5–5.0)
ALK PHOS: 60 U/L (ref 38–126)
ALT: 17 U/L (ref 14–54)
ANION GAP: 8 (ref 5–15)
AST: 29 U/L (ref 15–41)
BUN: 14 mg/dL (ref 6–20)
CALCIUM: 9.5 mg/dL (ref 8.9–10.3)
CO2: 24 mmol/L (ref 22–32)
CREATININE: 0.65 mg/dL (ref 0.44–1.00)
Chloride: 107 mmol/L (ref 101–111)
GFR calc Af Amer: >60 mL/min (ref >60)
GFR calc non Af Amer: >60 mL/min (ref >60)
GLUCOSE: 150 mg/dL — AB (ref 65–99)
Potassium: 3.9 mmol/L (ref 3.5–5.1)
Sodium: 139 mmol/L (ref 135–145)
TOTAL PROTEIN: 7.4 g/dL (ref 6.5–8.1)
Total Bilirubin: 1.4 mg/dL — ABNORMAL HIGH (ref 0.3–1.2)

## 2016-04-27 LAB — TROPONIN I

## 2016-04-27 LAB — LIPASE, BLOOD: Lipase: 31 U/L (ref 11–51)

## 2016-04-27 LAB — ETHANOL: Alcohol, Ethyl (B): <5 mg/dL (ref <5)

## 2016-04-27 MED ORDER — PROMETHAZINE HCL 25 MG/ML IJ SOLN
12.5000 mg | Freq: Once | INTRAMUSCULAR | Status: AC
  Administered 2016-04-27: 12.5 mg via INTRAVENOUS
  Filled 2016-04-27: qty 1

## 2016-04-27 MED ORDER — ONDANSETRON HCL 4 MG/2ML IJ SOLN
4.0000 mg | Freq: Once | INTRAMUSCULAR | Status: AC
  Administered 2016-04-27: 4 mg via INTRAVENOUS

## 2016-04-27 MED ORDER — MECLIZINE HCL 25 MG PO TABS
25.0000 mg | ORAL_TABLET | Freq: Once | ORAL | Status: AC
  Administered 2016-04-27: 25 mg via ORAL
  Filled 2016-04-27: qty 1

## 2016-04-27 MED ORDER — MECLIZINE HCL 25 MG PO TABS: 25.0000 mg | ORAL_TABLET | Freq: Three times a day (TID) | ORAL | 0 refills | Status: DC | PRN

## 2016-04-27 MED ORDER — ONDANSETRON HCL 4 MG/2ML IJ SOLN
INTRAMUSCULAR | Status: AC
  Administered 2016-04-27: 4 mg via INTRAVENOUS
  Filled 2016-04-27: qty 2

## 2016-04-27 MED ORDER — PROMETHAZINE HCL 12.5 MG PO TABS: 12.5000 mg | ORAL_TABLET | Freq: Four times a day (QID) | ORAL | 0 refills | Status: DC | PRN

## 2016-04-27 MED ORDER — SODIUM CHLORIDE 0.9 % IV BOLUS (SEPSIS)
1000.0000 mL | Freq: Once | INTRAVENOUS | Status: AC
Start: 2016-04-27 — End: 2016-04-27
  Administered 2016-04-27: 1000 mL via INTRAVENOUS

## 2016-04-27 NOTE — ED Notes (Signed)
Patient given sprite to drink to see if she is able to tolerate PO fluids.

## 2016-04-27 NOTE — ED Notes (Signed)
Patient assisted onto the bed pan to urinate

## 2016-04-27 NOTE — ED Notes (Signed)
Report given to Angela, RN.

## 2016-04-27 NOTE — ED Notes (Signed)

## 2016-04-27 NOTE — ED Notes (Signed)
RN in room to check on patient, patient states that her nausea has improved but that she feels dizzy. Patient states that she has a hx/o vertigo and that this feels like her normal vertigo spells.   Dr. Alphonzo LemmingsMcshane informed and verbal order given for Meclizine 25 mg PO.

## 2016-04-27 NOTE — ED Provider Notes (Addendum)
[facility] Emergency Department Provider Note  ____________________________________________   I have reviewed the triage vital signs and the nursing notes.   HISTORY  Chief Complaint Nausea; Emesis; and Diarrhea    HPI Shanoah Asbill is a 76 y.o. female he was vomiting diarrhea since last night. She had 2 episodes of nonbloody nonbilious emesis, and one episode of loose stool. No melena no bright red blood per rectum. He denies abdominal pain. States she has a slight headache. Patient is very anxious because her blood pressure was elevated from EMS. She has a history which is not limited to but does include  anxiety arthritis fibromyalgia vertigo migraines and obesity. Also multiple chronic pain complaints..     Past Medical History:  Diagnosis Date  . Anxiety   . Arthritis   . Depression   . Fibromyalgia   . GERD (gastroesophageal reflux disease)   . History of carpal tunnel syndrome   . History of diverticulitis   . History of vertigo   . Hypercholesteremia   . Hypertension   . Migraine   . Neuralgia 2014  . Obesity   . Polymyalgia rheumatica [facility])     Patient Active Problem List   Diagnosis Date Noted  . Acute postoperative pain 01/09/2016  . Severe obesity (BMI 35.0-39.9) with comorbidity (HCC) 12/04/2015  . Chronic knee pain (S/P TKR) (Right) 12/04/2015  . Chronic knee pain (Left) 12/04/2015  . 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) (Right) 12/04/2015  . Osteoarthritis of knee (Left) 12/04/2015  . Carpal tunnel syndrome 11/23/2015  . Depression 11/23/2015  . Fibromyalgia 11/23/2015  . GERD (gastroesophageal reflux disease) 11/23/2015  . History of dysphagia 11/23/2015  . Hypothyroidism 11/23/2015  . Hyperlipidemia 11/23/2015  . Hypertension 11/23/2015  . Migraine headache 11/23/2015  . Osteoarthritis (arthritis due to wear and tear of joints) 11/23/2015  . Chronic pain 11/23/2015  . Chronic low back pain (Location of Tertiary  source of pain) (Bilateral) (L>R) 11/23/2015  . Chronic lower extremity pain (Bilateral) (L>R) 11/23/2015  . Chronic knee pain (Location of Primary Source of Pain) (Bilateral) (L>R) 11/23/2015  . Chronic hip pain (Location of Secondary source of pain) (Bilateral) (L>R) 11/23/2015  . 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) 11/23/2015  . Lumbar facet syndrome (Bilateral) (L>R) 11/23/2015  . Chronic sacroiliac joint pain (Bilateral) (L>R) 11/23/2015  . Long term current use of opiate analgesic 11/23/2015  . Long term prescription opiate use 11/23/2015  . Opiate use 11/23/2015  . Long term current use of non-steroidal anti-inflammatories (NSAID) 11/23/2015  . Disturbance of skin sensation 11/23/2015  . Diverticulosis 11/23/2015  . Neurogenic pain 11/23/2015  . 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 Encounter for pain management planning 11/23/2015  . Encounter for therapeutic drug level monitoring 11/23/2015  . Chronic neck pain 11/23/2015  . Lumbar spondylosis 11/23/2015  . Cervical spondylosis 11/23/2015  . History of TKR (total knee replacement) (Right) 11/23/2015  . Anxiety 08/25/2013  . Neuralgia 01/25/2013  . No transfusions per religious beliefs 02/27/2012    Past Surgical History:  Procedure Laterality Date  . ABDOMINAL HYSTERECTOMY    . BREAST CYST ASPIRATION Right +/- 10 yrs ago   x 2  . HERNIA REPAIR    . JOINT REPLACEMENT Right    knee 2015  . THYROIDECTOMY      Prior to Admission medications   Medication Sig Start Date End Date Taking? Authorizing Provider  [facility] D3 5000 units capsule TAKE 5,000 UNITS BY MOUTH  ONCE DAILY. 10/10/15   Historical Provider, MD  gabapentin (NEURONTIN) 300 MG capsule Take 300 mg by mouth 3 (three) times daily. 09/23/15   Historical Provider, MD  HYDROcodone-acetaminophen (NORCO/VICODIN) 5-325 MG tablet Take 1 tablet by mouth every 8 (eight) hours as needed for severe pain (for post-RF pain). 01/09/16 02/08/16   Delano MetzFrancisco Naveira, MD  meloxicam (MOBIC) 15 MG tablet Take 1 tablet (15 mg total) by mouth daily. 01/03/16 07/01/16  Delano MetzFrancisco Naveira, MD  PARoxetine (PAXIL) 40 MG tablet Take 40 mg by mouth daily. 09/11/15   Historical Provider, MD  ranitidine (ZANTAC) 150 MG capsule Take 150 mg by mouth 2 (two) times daily. 09/13/15   Historical Provider, MD  simvastatin (ZOCOR) 20 MG tablet Take by mouth 2 (two) times daily.  05/09/15   Historical Provider, MD    Allergies Demerol [meperidine]  Family History  Problem Relation Age of Onset  . Arthritis Mother   . Arthritis Father   . COPD Father   . Arthritis Sister   . Diabetes Sister   . Cancer Brother   . COPD Brother   . Cancer Son   . Drug abuse Son   . Stroke Maternal Grandmother   . Arthritis Paternal Grandfather   . Breast cancer Neg Hx     Social History Social History  Substance Use Topics  . Smoking status: Former Games developermoker  . Smokeless tobacco: Never Used  . Alcohol use No    Review of Systems Constitutional: No fever/chills Eyes: No visual changes. ENT: No sore throat. No stiff neck no neck pain Cardiovascular: Denies chest pain. Respiratory: Denies shortness of breath. Gastrointestinal:  See Genitourinary: Negative for dysuria. Musculoskeletal: Negative lower extremity swelling Skin: Negative for rash. Neurological: Negative for severe headaches, focal weakness or numbness. 10-point ROS otherwise negative.  ____________________________________________   PHYSICAL EXAM:  VITAL SIGNS: ED Triage Vitals  Enc Vitals Group     BP 04/27/16 1014 (!) 182/105     Pulse Rate 04/27/16 1014 85     Resp 04/27/16 1014 (!) 24     Temp 04/27/16 1014 98 F (36.7 C)     Temp Source 04/27/16 1014 Oral     SpO2 04/27/16 1014 99 %     Weight 04/27/16 0948 208 lb 8.9 oz (94.6 kg)     Height 04/27/16 0948 5\' 1"  (1.549 m)     Head Circumference --      Peak Flow --      Pain Score 04/27/16 0949 8     Pain Loc --      Pain Edu? --       Excl. in GC? --     Constitutional: Alert and oriented. Well appearing and in no acute distress.Very anxious and upset Eyes: Conjunctivae are normal. PERRL. EOMI. Head: Atraumatic. Nose: No congestion/rhinnorhea. Mouth/Throat: Mucous membranes are moist.  Oropharynx non-erythematous. Neck: No stridor.   Nontender with no meningismus Cardiovascular: Normal rate, regular rhythm. Grossly normal heart sounds.  Good peripheral circulation. Respiratory: Normal respiratory effort.  No retractions. Lungs CTAB. Abdominal: Soft and nontender. No distention. No guarding no rebound Back:  There is no focal tenderness or step off.  there is no midline tenderness there are no lesions noted. there is no CVA tenderness Musculoskeletal: No lower extremity tenderness, no upper extremity tenderness. No joint effusions, no DVT signs strong distal pulses no edema Neurologic:  Normal speech and language. No gross focal neurologic deficits are appreciated.  Skin:  Skin is warm, dry and intact.  No rash noted. Psychiatric: Mood and affect are anxious. Speech and behavior are normal.  ____________________________________________   LABS (all labs ordered are listed, but only abnormal results are displayed)  Labs Reviewed  COMPREHENSIVE METABOLIC PANEL  ETHANOL  LIPASE, BLOOD  CBC WITH DIFFERENTIAL/PLATELET  TROPONIN I  URINALYSIS, COMPLETE (UACMP) WITH MICROSCOPIC   ____________________________________________  EKG  I personally interpreted any EKGs ordered by me or triage  ____________________________________________  RADIOLOGY  I reviewed any imaging ordered by me or triage that were performed during my shift and, if possible, patient and/or family made aware of any abnormal findings. ____________________________________________   PROCEDURES  Procedure(s) performed: None  Procedures  Critical Care performed: None  ____________________________________________   INITIAL IMPRESSION /  ASSESSMENT AND PLAN / ED COURSE  Pertinent labs & imaging results that were available during my care of the patient were reviewed by me and considered in my medical decision making (see chart for details).  Patient here with nausea vomiting diarrhea since last night very well appearing but quite anxious. Blood pressure somewhat elevated but again patient is very anxious about blood pressure particular and her vomiting in general. Abdomen is benign lungs are clear no chest pain no shortness of breath. We will continue to monitor her. It is my hope that her blood pressure continued to trend down as she relaxes. Low suspicion for ACS PE or dissection, doubt any head bleed or meningitis or likely either. Abdomen is also benign.  ----------------------------------------- 12:30 PM on 04/27/2016 -----------------------------------------  After Phenergan, patient is much more relaxed her blood pressure reflects his pressure is now the 150s. We have showing blood work, patient is tolerating by mouth. She states that her chronic vertigo "might be starting" and is requesting Antivert. She is neurologically intact and do not think this represents a process such as CVA or increased pressure. At this time, patient appears safe for discharge, will make sure that she can walk and if she can we'll try to send her home.  Patient in no acute distress at this time. Her blood pressure was somewhat elevated but this is clearly in some ways related to her anxiety. She states that the reason she called 911 as can she had the vomiting and diarrhea and then checked her blood pressure and found to be high call 911 for that reason. There is no evidence of ongoing ischemic or other pathology related to this and as soon as she relaxed, she became much more normotensive. Nonetheless I have strongly advised close outpatient follow-up for her blood pressure with her doctor. She states she is touch her doctor many times about this but  he won't start her on blood pressure medications, because I suspected of reasons that I have given the patient about the danger of dropping her blood pressure for what is possibly anxiety related hypertension. Nonetheless, the Academy of emergency [employer] does not recommend starting blood pressure medications in this context and we will certainly not to sew at this time  Clinical Course    ____________________________________________   FINAL CLINICAL IMPRESSION(S) / ED DIAGNOSES  Final diagnoses:  None      This chart was dictated using voice recognition software.  Despite best efforts to proofread,  errors can occur which can change meaning.      Jeanmarie Plant, MD 04/27/16 1022    Jeanmarie Plant, MD 04/27/16 1230    Jeanmarie Plant, MD 04/27/16 [phone]

## 2016-04-27 NOTE — ED Triage Notes (Signed)
Pt presents to ED with c/o N/V/D since last night. Per EMS pt also c/o photosensitivity at this time, pt noted to be covering her eyes at this time. Pt actively vomiting during triage. Pt also c/o abdominal pain at this time.

## 2016-06-13 ENCOUNTER — Ambulatory Visit: Payer: Medicare Other | Admitting: Pain Medicine

## 2016-06-27 ENCOUNTER — Ambulatory Visit: Payer: Medicare Other | Admitting: Pain Medicine

## 2016-07-01 ENCOUNTER — Encounter: Payer: Medicare Other | Admitting: Pain Medicine

## 2016-07-09 ENCOUNTER — Ambulatory Visit: Payer: Medicare Other | Attending: Pain Medicine | Admitting: Pain Medicine

## 2016-07-09 ENCOUNTER — Encounter: Payer: Self-pay | Admitting: Pain Medicine

## 2016-07-09 VITALS — BP 141/84 | HR 77 | Temp 97.7°F | Resp 16 | Ht 61.0 in | Wt 200.0 lb

## 2016-07-09 DIAGNOSIS — M25552 Pain in left hip: Secondary | ICD-10-CM | POA: Diagnosis present

## 2016-07-09 DIAGNOSIS — F329 Major depressive disorder, single episode, unspecified: Secondary | ICD-10-CM | POA: Insufficient documentation

## 2016-07-09 DIAGNOSIS — Z90710 Acquired absence of both cervix and uterus: Secondary | ICD-10-CM | POA: Insufficient documentation

## 2016-07-09 DIAGNOSIS — G5603 Carpal tunnel syndrome, bilateral upper limbs: Secondary | ICD-10-CM | POA: Diagnosis not present

## 2016-07-09 DIAGNOSIS — M533 Sacrococcygeal disorders, not elsewhere classified: Secondary | ICD-10-CM

## 2016-07-09 DIAGNOSIS — M25551 Pain in right hip: Secondary | ICD-10-CM | POA: Diagnosis present

## 2016-07-09 DIAGNOSIS — Z833 Family history of diabetes mellitus: Secondary | ICD-10-CM | POA: Insufficient documentation

## 2016-07-09 DIAGNOSIS — F419 Anxiety disorder, unspecified: Secondary | ICD-10-CM | POA: Diagnosis not present

## 2016-07-09 DIAGNOSIS — M25561 Pain in right knee: Secondary | ICD-10-CM | POA: Diagnosis not present

## 2016-07-09 DIAGNOSIS — Z87891 Personal history of nicotine dependence: Secondary | ICD-10-CM | POA: Diagnosis not present

## 2016-07-09 DIAGNOSIS — M5416 Radiculopathy, lumbar region: Secondary | ICD-10-CM | POA: Diagnosis not present

## 2016-07-09 DIAGNOSIS — Z888 Allergy status to other drugs, medicaments and biological substances status: Secondary | ICD-10-CM | POA: Diagnosis not present

## 2016-07-09 DIAGNOSIS — Z79891 Long term (current) use of opiate analgesic: Secondary | ICD-10-CM | POA: Diagnosis not present

## 2016-07-09 DIAGNOSIS — M542 Cervicalgia: Secondary | ICD-10-CM | POA: Diagnosis not present

## 2016-07-09 DIAGNOSIS — Z823 Family history of stroke: Secondary | ICD-10-CM | POA: Insufficient documentation

## 2016-07-09 DIAGNOSIS — Z809 Family history of malignant neoplasm, unspecified: Secondary | ICD-10-CM | POA: Insufficient documentation

## 2016-07-09 DIAGNOSIS — M797 Fibromyalgia: Secondary | ICD-10-CM | POA: Insufficient documentation

## 2016-07-09 DIAGNOSIS — Z9889 Other specified postprocedural states: Secondary | ICD-10-CM | POA: Insufficient documentation

## 2016-07-09 DIAGNOSIS — M25559 Pain in unspecified hip: Secondary | ICD-10-CM

## 2016-07-09 DIAGNOSIS — Z79899 Other long term (current) drug therapy: Secondary | ICD-10-CM | POA: Insufficient documentation

## 2016-07-09 DIAGNOSIS — R42 Dizziness and giddiness: Secondary | ICD-10-CM | POA: Insufficient documentation

## 2016-07-09 DIAGNOSIS — Z825 Family history of asthma and other chronic lower respiratory diseases: Secondary | ICD-10-CM | POA: Diagnosis not present

## 2016-07-09 DIAGNOSIS — M545 Low back pain: Secondary | ICD-10-CM | POA: Diagnosis not present

## 2016-07-09 DIAGNOSIS — F1190 Opioid use, unspecified, uncomplicated: Secondary | ICD-10-CM | POA: Diagnosis not present

## 2016-07-09 DIAGNOSIS — M791 Myalgia: Secondary | ICD-10-CM | POA: Diagnosis not present

## 2016-07-09 DIAGNOSIS — M5432 Sciatica, left side: Secondary | ICD-10-CM | POA: Diagnosis present

## 2016-07-09 DIAGNOSIS — Z8261 Family history of arthritis: Secondary | ICD-10-CM | POA: Insufficient documentation

## 2016-07-09 DIAGNOSIS — M7918 Myalgia, other site: Secondary | ICD-10-CM

## 2016-07-09 DIAGNOSIS — M353 Polymyalgia rheumatica: Secondary | ICD-10-CM | POA: Diagnosis not present

## 2016-07-09 DIAGNOSIS — M25562 Pain in left knee: Secondary | ICD-10-CM | POA: Diagnosis not present

## 2016-07-09 DIAGNOSIS — M150 Primary generalized (osteo)arthritis: Secondary | ICD-10-CM | POA: Diagnosis not present

## 2016-07-09 DIAGNOSIS — E7800 Pure hypercholesterolemia, unspecified: Secondary | ICD-10-CM | POA: Insufficient documentation

## 2016-07-09 DIAGNOSIS — G43909 Migraine, unspecified, not intractable, without status migrainosus: Secondary | ICD-10-CM | POA: Insufficient documentation

## 2016-07-09 DIAGNOSIS — K219 Gastro-esophageal reflux disease without esophagitis: Secondary | ICD-10-CM | POA: Diagnosis not present

## 2016-07-09 DIAGNOSIS — Z96651 Presence of right artificial knee joint: Secondary | ICD-10-CM | POA: Diagnosis not present

## 2016-07-09 DIAGNOSIS — M47816 Spondylosis without myelopathy or radiculopathy, lumbar region: Secondary | ICD-10-CM

## 2016-07-09 DIAGNOSIS — G894 Chronic pain syndrome: Secondary | ICD-10-CM | POA: Diagnosis not present

## 2016-07-09 DIAGNOSIS — M159 Polyosteoarthritis, unspecified: Secondary | ICD-10-CM

## 2016-07-09 DIAGNOSIS — I10 Essential (primary) hypertension: Secondary | ICD-10-CM | POA: Insufficient documentation

## 2016-07-09 DIAGNOSIS — G8929 Other chronic pain: Secondary | ICD-10-CM

## 2016-07-09 MED ORDER — MELOXICAM 15 MG PO TABS: 15.0000 mg | ORAL_TABLET | Freq: Every day | ORAL | 5 refills | Status: DC

## 2016-07-09 MED ORDER — METHOCARBAMOL 500 MG PO TABS: 500.0000 mg | ORAL_TABLET | Freq: Three times a day (TID) | ORAL | 5 refills | Status: DC | PRN

## 2016-07-09 NOTE — Progress Notes (Signed)
Patient's Name: Alice [name]  MRN: [id number]  Referring Provider: Valera Castle, *  DOB: March 22, 1941  PCP: Valera Castle, MD  DOS: 07/09/2016  Note by: Kathlen Brunswick. Dossie Arbour, MD  Service setting: Ambulatory outpatient  Specialty: Interventional Pain Management  Location: [facility] (AMB) Pain Management Facility    Patient type: Established   Primary Reason(s) for Visit: Encounter for prescription drug management & post-procedure evaluation of chronic illness with mild to moderate exacerbation(Level of risk: moderate) CC: Hip Pain (left sciatica ) and Hip Pain (right d/t a fall x 2 days ago.)  HPI  Alice [name] is a 76 y.o. year old, female patient, who comes today for a post-procedure evaluation and medication management. She has Anxiety; Carpal tunnel syndrome; Depression; Fibromyalgia; GERD (gastroesophageal reflux disease); History of dysphagia; Hypothyroidism; Hyperlipidemia; Hypertension; Migraine headache; Neuralgia; No transfusions per religious beliefs; Osteoarthritis (arthritis due to wear and tear of joints); Chronic low back pain (Location of Tertiary source of pain) (Bilateral) (L>R); Chronic lower extremity pain (Bilateral) (L>R); Chronic knee pain (Location of Primary Source of Pain) (Bilateral) (L>R); Chronic hip pain (Location of Secondary source of pain) (Bilateral) (L>R); 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); Lumbar facet syndrome (Bilateral) (L>R); Chronic sacroiliac joint pain (Bilateral) (L>R); Long term current use of opiate analgesic; Long term prescription opiate use; Opiate use; Long term current use of non-steroidal anti-inflammatories (NSAID); Disturbance of skin sensation; Diverticulosis; Neurogenic pain; 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 Her primarily concern today is the Hip Pain (left sciatica ) and Hip Pain (right d/t a fall x 2 days ago.)  Pain Assessment: Self-Reported Pain Score: 1 /10             Reported level is compatible with observation.       Pain Type: Chronic pain Pain Location: Hip Pain Orientation: Left Pain Descriptors / Indicators: Radiating, Sharp Pain Frequency: Intermittent  Alice [name] was last seen on 02/21/2016 for a procedure. During today's appointment we reviewed Alice [name] post-procedure results, as well as her outpatient medication regimen. Today the patient tested to me that she had given some of her hydrocodone to her daughter, who apparently has chronic pain and has been trying to get into a pain clinic unsuccessfully. She was reminded that doing this is actually illegal and she promised that she would not do it again. In any case, she has pointed out that she would prefer not to take any of the hydrocodone and therefore this prescription will not be renewed. The patient asked about Aleve and how many times a day she can take it and I pointed out that as long as she is taking the meloxicam, she should not take any. However, I also pointed out that if she feels that the [place] works better, then she can take that instead of the meloxicam. I reminded her that taking both would increase her cardiovascular risk as well as her incidence of GI ulcers. The patient was also reminded that both can stress the kidneys. She was instructed to use the nonsteroidal anti-inflammatory drugs for the arthritic pain which is usually the one that she experiences in  the morning. For the afternoon pain, which is usually musculoskeletal, I have started her on a trial of  Robaxin 500 mg by mouth 3 times a day. She also takes Neurontin and this seems to work well for her. The Neurontin has not being prescribed by me. The patient was given a PRN procedure: Palliative left L2-3 lumbar epidural steroid injection + left sacroiliac joint injection under fluoroscopic guidance and IV sedation. This what she had last time and it did provide her with 100% relief of the pain for almost 2 months followed by a decrease to 75% which continues at this time. The patient was reminded that if the pain returns, she can call to have this procedure repeated.  Further details on both, my assessment(s), as well as the proposed treatment plan, please see below.  Controlled Substance Pharmacotherapy Assessment REMS (Risk Evaluation and Mitigation Strategy)  Analgesic:Hydrocodone/APAP 5/325 3 tablets per day (15 mg/dayof hydrocodone) MME/day:15 mg/day Janett Billow, RN  07/09/2016  8:10 AM  Sign at close encounter Nursing Pain Medication Assessment:  Safety precautions to be maintained throughout the outpatient stay will include: orient to surroundings, keep bed in low position, maintain call bell within reach at all times, provide assistance with transfer out of bed and ambulation.  Medication Inspection Compliance: Pill count conducted under aseptic conditions, in front of the patient. Neither the pills nor the bottle was removed from the patient's sight at any time. Once count was completed pills were immediately returned to the patient in their original bottle.  Medication: See above Pill/Patch Count: 0 of 90 pills remain Bottle Appearance: Standard pharmacy container. Clearly labeled. Filled Date: 19 / 06 / 2017 Last Medication intake:  Today   Pharmacokinetics: Liberation and absorption (onset of action): WNL Distribution (time to peak effect): WNL Metabolism and excretion (duration of action): WNL         Pharmacodynamics: Desired effects: Analgesia: Alice [name] reports  >50% benefit. Functional ability: Patient reports that medication allows her to accomplish basic ADLs Clinically meaningful improvement in function (CMIF): Sustained CMIF goals met Perceived effectiveness: Described as relatively effective, allowing for increase in activities of daily living (ADL) Undesirable effects: Side-effects or Adverse reactions: None reported Monitoring: [place] PMP: Online review of the past 2-monthperiod conducted. Compliant with practice rules and regulations List of all UDS test(s) done:  Lab Results  Component Value Date   SUMMARY FINAL 11/23/2015   Last UDS on record: No results found for: TOXASSSELUR UDS interpretation: Compliant          Medication Assessment Form: Reviewed. Patient indicates being compliant with therapy Treatment compliance: Compliant Risk Assessment Profile: Aberrant behavior: See prior evaluations. None observed or detected today Comorbid factors increasing risk of overdose: See prior notes. No additional risks detected today Risk of substance use disorder (SUD): Low Opioid Risk Tool (ORT) Total Score: 7  Interpretation Table:  Score <3 = Low Risk for SUD  Score between 4-7 = Moderate Risk for SUD  Score >8 = High Risk for Opioid Abuse   Risk Mitigation Strategies:  Patient Counseling: Covered Patient-Prescriber Agreement (PPA): Present and active  Notification to other healthcare providers: Done  Pharmacologic Plan: No change in therapy, at this time  Post-Procedure Assessment  02/21/2016 Procedure: Diagnostic left L2-3 translaminar epidural steroid injection + diagnostic left sacroiliac joint injection Pre-procedure pain score:  1/10 Post-procedure pain score: 0/10 (100% relief) Influential Factors: BMI: 37.79 kg/m Intra-procedural challenges: None observed Assessment challenges: None detected  Post-procedural side-effects, adverse reactions, or complications: None reported Reported issues: None  Sedation:  Sedation provided. When no sedatives are used, the analgesic levels obtained are directly associated to the effectiveness of the local anesthetics. However, when sedation is provided, the level of analgesia obtained during the initial 1 hour following the intervention, is believed to be the result of a combination of factors. These factors may include, but are not limited to: 1. The effectiveness of the local anesthetics used. 2. The effects of the analgesic(s) and/or anxiolytic(s) used. 3. The degree of discomfort experienced by the patient at the time of the procedure. 4. The patients ability and reliability in recalling and recording the events. 5. The presence and influence of possible secondary gains and/or psychosocial factors. Reported result: Relief experienced during the 1st hour after the procedure: 100 % (Ultra-Short Term Relief) Interpretative annotation: Analgesia during this period is likely to be Local Anesthetic and/or IV Sedative (Analgesic/Anxiolitic) related.          Effects of local anesthetic: The analgesic effects attained during this period are directly associated to the localized infiltration of local anesthetics and therefore cary significant diagnostic value as to the etiological location, or anatomical origin, of the pain. Expected duration of relief is directly dependent on the pharmacodynamics of the local anesthetic used. Long-acting (4-6 hours) anesthetics used.  Reported result: Relief during the next 4 to 6 hour after the procedure: 100 % (Short-Term Relief) Interpretative annotation: Complete relief would suggest area to be the source of the pain.          Long-term benefit: Defined as the period of time past the expected duration of local anesthetics. With the possible exception of prolonged sympathetic blockade from the local anesthetics, benefits during this period are typically attributed to, or associated with, other factors such as analgesic sensory neuropraxia,  antiinflammatory effects, or beneficial biochemical changes provided by agents other than the local anesthetics Reported result: Extended relief following procedure: 100 % (lasted approx 2 months at approx 100%.  pain is not as severe as it was. ) (Long-Term Relief) Interpretative annotation: Good relief. This could suggest inflammation to be a significant component in the etiology to the pain.          Current benefits: Defined as persistent relief that continues at this point in time.   Reported results: Treated area: <75 % Alice [name] reports improvement in function Interpretative annotation: Ongoing benefits would suggest effective therapeutic approach  Interpretation: Results would suggest a successful diagnostic intervention.          Laboratory Chemistry  Inflammation Markers Lab Results  Component Value Date   CRP 0.6 11/23/2015   ESRSEDRATE 10 11/23/2015   (CRP: Acute Phase) (ESR: Chronic Phase) Renal Function Markers Lab Results  Component Value Date   BUN 14 04/27/2016   CREATININE 0.65 04/27/2016   GFRAA >60 04/27/2016   GFRNONAA >60 04/27/2016   Hepatic Function Markers Lab Results  Component Value Date   AST 29 04/27/2016   ALT 17 04/27/2016   ALBUMIN 4.4 04/27/2016   ALKPHOS 60 04/27/2016   Electrolytes Lab Results  Component Value Date   NA 139 04/27/2016   K 3.9 04/27/2016   CL 107 04/27/2016   CALCIUM 9.5 04/27/2016   MG 1.9 11/23/2015   Neuropathy Markers Lab Results  Component Value Date   VITAMINB12 271 11/23/2015   Bone Pathology Markers Lab Results  Component Value Date   ALKPHOS 60 04/27/2016   25OHVITD1 40 11/23/2015  25OHVITD2 8.5 11/23/2015   25OHVITD3 31 11/23/2015   CALCIUM 9.5 04/27/2016   Coagulation Parameters Lab Results  Component Value Date   INR 1.0 10/21/2013   LABPROT 13.5 10/21/2013   APTT 34.8 10/21/2013   PLT 196 04/27/2016   Cardiovascular Markers Lab Results  Component Value Date   HGB 15.7 04/27/2016    HCT 44.3 04/27/2016   Note: Lab results reviewed.  Recent Diagnostic Imaging Review  No results found. Note: Imaging results reviewed.          Meds  The patient has a current medication list which includes the following prescription(s): [facility] d3, ferrous sulfate, gabapentin, losartan potassium, meclizine, meloxicam, methocarbamol, paroxetine, ranitidine, and simvastatin.  Current Outpatient Prescriptions on File Prior to Visit  Medication Sig  . [facility] D3 5000 units capsule TAKE 5,000 UNITS BY MOUTH ONCE DAILY.  Marland Kitchen gabapentin (NEURONTIN) 300 MG capsule Take 300 mg by mouth 3 (three) times daily.  . meclizine (ANTIVERT) 25 MG tablet Take 1 tablet (25 mg total) by mouth 3 (three) times daily as needed.  Marland Kitchen PARoxetine (PAXIL) 40 MG tablet Take 40 mg by mouth daily.  . ranitidine (ZANTAC) 150 MG capsule Take 150 mg by mouth 2 (two) times daily.  . simvastatin (ZOCOR) 20 MG tablet Take by mouth 2 (two) times daily.    No current facility-administered medications on file prior to visit.    ROS  Constitutional: Denies any fever or chills Gastrointestinal: No reported hemesis, hematochezia, vomiting, or acute GI distress Musculoskeletal: Denies any acute onset joint swelling, redness, loss of ROM, or weakness Neurological: No reported episodes of acute onset apraxia, aphasia, dysarthria, agnosia, amnesia, paralysis, loss of coordination, or loss of consciousness  Allergies  Alice [name] is allergic to demerol [meperidine].  [place]  Drug: Alice [name]  reports that she does not use drugs. Alcohol:  reports that she does not drink alcohol. Tobacco:  reports that she has quit smoking. She has never used smokeless tobacco. Medical:  has a past medical history of Anxiety; Arthritis; Depression; Fibromyalgia; GERD (gastroesophageal reflux disease); History of carpal tunnel syndrome; History of diverticulitis; History of vertigo; Hypercholesteremia; Hypertension; Migraine; Neuralgia (2014); Obesity; and  Polymyalgia rheumatica ([place]). Family: family history includes Arthritis in her father, mother, paternal grandfather, and sister; COPD in her brother and father; Cancer in her brother and son; Diabetes in her sister; Drug abuse in her son; Stroke in her maternal grandmother.  Past Surgical History:  Procedure Laterality Date  . ABDOMINAL HYSTERECTOMY    . BREAST CYST ASPIRATION Right +/- 10 yrs ago   x 2  . HERNIA REPAIR    . JOINT REPLACEMENT Right    knee 2015  . THYROIDECTOMY     Constitutional Exam  General appearance: Well nourished, well developed, and well hydrated. In no apparent acute distress Vitals:   07/09/16 0808  BP: (!) 141/84  Pulse: 77  Resp: 16  Temp: 97.7 F (36.5 C)  TempSrc: Oral  SpO2: 99%  Weight: 200 lb (90.7 kg)  Height: 5' 1"  (1.549 m)   BMI Assessment: Estimated body mass index is 37.79 kg/m as calculated from the following:   Height as of this encounter: 5' 1"  (1.549 m).   Weight as of this encounter: 200 lb (90.7 kg).  BMI interpretation table: BMI level Category Range association with higher incidence of chronic pain  <18 kg/m2 Underweight   18.5-24.9 kg/m2 Ideal body weight   25-29.9 kg/m2 Overweight Increased incidence by 20%  30-34.9 kg/m2 Obese (  Class I) Increased incidence by 68%  35-39.9 kg/m2 Severe obesity (Class II) Increased incidence by 136%  >40 kg/m2 Extreme obesity (Class III) Increased incidence by 254%   BMI Readings from Last 4 Encounters:  07/09/16 37.79 kg/m  04/27/16 39.41 kg/m  02/21/16 35.67 kg/m  02/15/16 36.84 kg/m   Wt Readings from Last 4 Encounters:  07/09/16 200 lb (90.7 kg)  04/27/16 208 lb 8.9 oz (94.6 kg)  02/21/16 195 lb (88.5 kg)  02/15/16 195 lb (88.5 kg)  Psych/Mental status: Alert, oriented x 3 (person, place, & time)       Eyes: PERLA Respiratory: No evidence of acute respiratory distress  Cervical Spine Exam  Inspection: No masses, redness, or swelling Alignment: Symmetrical Functional  ROM: Unrestricted ROM Stability: No instability detected Muscle strength & Tone: Functionally intact Sensory: Unimpaired Palpation: No palpable anomalies  Upper Extremity (UE) Exam    Side: Right upper extremity  Side: Left upper extremity  Inspection: No masses, redness, swelling, or asymmetry. No contractures  Inspection: No masses, redness, swelling, or asymmetry. No contractures  Functional ROM: Unrestricted ROM          Functional ROM: Unrestricted ROM          Muscle strength & Tone: Functionally intact  Muscle strength & Tone: Functionally intact  Sensory: Unimpaired  Sensory: Unimpaired  Palpation: No palpable anomalies  Palpation: No palpable anomalies  Specialized Test(s): Deferred         Specialized Test(s): Deferred          Thoracic Spine Exam  Inspection: No masses, redness, or swelling Alignment: Symmetrical Functional ROM: Unrestricted ROM Stability: No instability detected Sensory: Unimpaired Muscle strength & Tone: No palpable anomalies  Lumbar Spine Exam  Inspection: No masses, redness, or swelling Alignment: Symmetrical Functional ROM: Unrestricted ROM Stability: No instability detected Muscle strength & Tone: Functionally intact Sensory: Unimpaired Palpation: No palpable anomalies Provocative Tests: Lumbar Hyperextension and rotation test: evaluation deferred today       Patrick's Maneuver: evaluation deferred today              Gait & Posture Assessment  Ambulation: Unassisted Gait: Relatively normal for age and body habitus Posture: WNL   Lower Extremity Exam    Side: Right lower extremity  Side: Left lower extremity  Inspection: No masses, redness, swelling, or asymmetry. No contractures  Inspection: No masses, redness, swelling, or asymmetry. No contractures  Functional ROM: Unrestricted ROM          Functional ROM: Unrestricted ROM          Muscle strength & Tone: Functionally intact  Muscle strength & Tone: Functionally intact  Sensory:  Unimpaired  Sensory: Unimpaired  Palpation: No palpable anomalies  Palpation: No palpable anomalies   Assessment  Primary Diagnosis & Pertinent Problem List: The primary encounter diagnosis was Chronic knee pain (Location of Primary Source of Pain) (Bilateral) (L>R). Diagnoses of Chronic hip pain, unspecified laterality, Chronic low back pain (Location of Tertiary source of pain) (Bilateral) (L>R), Lumbar spondylosis, Primary osteoarthritis involving multiple joints, Chronic pain syndrome, Long term current use of opiate analgesic, Opiate use, Musculoskeletal pain, Chronic sacroiliac joint pain (Bilateral) (L>R), and 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) were also pertinent to this visit.  Status Diagnosis  Controlled Controlled Controlled 1. Chronic knee pain (Location of Primary Source of Pain) (Bilateral) (L>R)   2. Chronic hip pain, unspecified laterality   3. Chronic low back pain (Location of Tertiary source of pain) (Bilateral) (L>R)  4. Lumbar spondylosis   5. Primary osteoarthritis involving multiple joints   6. Chronic pain syndrome   7. Long term current use of opiate analgesic   8. Opiate use   9. Musculoskeletal pain   10. Chronic sacroiliac joint pain (Bilateral) (L>R)   11. 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)      Plan of Care  Pharmacotherapy (Medications Ordered): Meds ordered this encounter  Medications  . meloxicam (MOBIC) 15 MG tablet    Sig: Take 1 tablet (15 mg total) by mouth daily.    Dispense:  30 tablet    Refill:  5    Do not place this medication, or any other prescription from our practice, on "Automatic Refill". Patient may have prescription filled one day early if pharmacy is closed on scheduled refill date.  . methocarbamol (ROBAXIN) 500 MG tablet    Sig: Take 1 tablet (500 mg total) by mouth every 8 (eight) hours as needed for muscle spasms.    Dispense:  90 tablet    Refill:  5    Do not place this  medication, or any other prescription from our practice, on "Automatic Refill". Patient may have prescription filled one day early if pharmacy is closed on scheduled refill date.   New Prescriptions   METHOCARBAMOL (ROBAXIN) 500 MG TABLET    Take 1 tablet (500 mg total) by mouth every 8 (eight) hours as needed for muscle spasms.   Medications administered today: Alice [name] had no medications administered during this visit. Lab-work, procedure(s), and/or referral(s): Orders Placed This Encounter  Procedures  . Lumbar Epidural Injection  . SACROILIAC JOINT INJECTINS   Imaging and/or referral(s): None  Interventional therapies: Planned, scheduled, and/or pending:   None at this time.   Considering:   Diagnostic right knee genicular nerve block  Diagnostic left intra-articular knee injection with local anesthetic Possible series of 5 left intra-articular knee Hyalgan injections Possible diagnostic left genicular nerve block  Possible bilateral genicular nerve radiofrequency ablation  Diagnostic bilateral lumbar facet block  Possible bilateral lumbar facet radiofrequency ablation  Diagnostic left sided L4-5 lumbar epidural steroid injection    Palliative PRN treatment(s):   Palliative left L2-3 interlaminar epidural steroid injection + left SI joint injection  Diagnostic right knee genicular nerve block  Diagnostic left intra-articular knee injection with local anesthetic Diagnostic bilateral lumbar facet block  Diagnostic left sided L4-5 lumbar epidural steroid injection    Provider-requested follow-up: Return in about 6 months (around 01/09/2017) for (Nurse Practitioner) Med-Mgmt, in addition, (PRN) procedure.  Future Appointments Date Time Provider [place]  01/06/2017 10:00 AM [place], NP [facility] None   Primary Care Physician: Valera Castle, MD Location: [facility] Outpatient Pain Management Facility Note by: Kathlen Brunswick. Dossie Arbour, M.D, DABA, DABAPM, DABPM,  DABIPP, FIPP Date: 07/09/2016; Time: 9:05 AM  Pain Score Disclaimer: We use the NRS-11 scale. This is a self-reported, subjective measurement of pain severity with only modest accuracy. It is used primarily to identify changes within a particular patient. It must be understood that outpatient pain scales are significantly less accurate that those used for research, where they can be applied under ideal controlled circumstances with minimal exposure to variables. In reality, the score is likely to be a combination of pain intensity and pain affect, where pain affect describes the degree of emotional arousal or changes in action readiness caused by the sensory experience of pain. Factors such as social and work situation, setting, emotional state, anxiety levels, expectation,  and prior pain experience may influence pain perception and show large inter-individual differences that may also be affected by time variables.  Patient instructions provided during this appointment: Patient Instructions  You have been given meloxicam and robaxin at your pharmacy to pick up.  You have3 procedures ordered if you need them.  Instructions have been given how to prepare for the procedure when having sedation.  Refer to information below for refresher.     Preparing for Procedure with Sedation Instructions: . Oral Intake: Do not eat or drink anything for at least 8 hours prior to your procedure. . Transportation: Public transportation is not allowed. Bring an adult driver. The driver must be physically present in our waiting room before any procedure can be started. Marland Kitchen Physical Assistance: Bring an adult capable of physically assisting you, in the event you need help. . Blood Pressure Medicine: Take your blood pressure medicine with a sip of water the morning of the procedure. . Insulin: Take only  of your normal insulin dose. . Preventing infections: Shower with an antibacterial soap the morning of your  procedure. . Build-up your immune system: Take 1000 mg of Vitamin C with every meal (3 times a day) the day prior to your procedure. . Pregnancy: If you are pregnant, call and cancel the procedure. . Sickness: If you have a cold, fever, or any active infections, call and cancel the procedure. . Arrival: You must be in the facility at least 30 minutes prior to your scheduled procedure. . Children: Do not bring children with you. . Dress appropriately: Bring dark clothing that you would not mind if they get stained. . Valuables: Do not bring any jewelry or valuables. Procedure appointments are reserved for interventional treatments only. Marland Kitchen No Prescription Refills. . No medication changes will be discussed during procedure appointments. No disability issues will be discussed.Epidural Steroid Injection Patient Information  Description: The epidural space surrounds the nerves as they exit the spinal cord.  In some patients, the nerves can be compressed and inflamed by a bulging disc or a tight spinal canal (spinal stenosis).  By injecting steroids into the epidural space, we can bring irritated nerves into direct contact with a potentially helpful medication.  These steroids act directly on the irritated nerves and can reduce swelling and inflammation which often leads to decreased pain.  Epidural steroids may be injected anywhere along the spine and from the neck to the low back depending upon the location of your pain.   After numbing the skin with local anesthetic (like Novocaine), a small needle is passed into the epidural space slowly.  You may experience a sensation of pressure while this is being done.  The entire block usually last less than 10 minutes.  Conditions which may be treated by epidural steroids:  Low back and leg pain Neck and arm pain Spinal stenosis Post-laminectomy syndrome Herpes zoster (shingles) pain Pain from compression fractures  Preparation for the injection:  Do  not eat any solid food or dairy products within 8 hours of your appointment.  You may drink clear liquids up to 3 hours before appointment.  Clear liquids include water, black coffee, juice or soda.  No milk or cream please. You may take your regular medication, including pain medications, with a sip of water before your appointment  Diabetics should hold regular insulin (if taken separately) and take 1/2 normal NPH dos the morning of the procedure.  Carry some sugar containing items with you to your appointment. A driver  must accompany you and be prepared to drive you home after your procedure.  Bring all your current medications with your. An IV may be inserted and sedation may be given at the discretion of the physician.   A blood pressure cuff, EKG and other monitors will often be applied during the procedure.  Some patients may need to have extra oxygen administered for a short period. You will be asked to provide medical information, including your allergies, prior to the procedure.  We must know immediately if you are taking blood thinners (like Coumadin/Warfarin)  Or if you are allergic to IV iodine contrast (dye). We must know if you could possible be pregnant.  Possible side-effects: Bleeding from needle site Infection (rare, may require surgery) Nerve injury (rare) Numbness & tingling (temporary) Difficulty urinating (rare, temporary) Spinal headache ( a headache worse with upright posture) Light -headedness (temporary) Pain at injection site (several days) Meloxicam tablets What is this medicine? MELOXICAM (mel OX i cam) is a non-steroidal anti-inflammatory drug (NSAID). It is used to reduce swelling and to treat pain. It may be used for osteoarthritis, rheumatoid arthritis, or juvenile rheumatoid arthritis. This medicine may be used for other purposes; ask your health care provider or pharmacist if you have questions. COMMON BRAND NAME(S): Mobic What should I tell my health care  provider before I take this medicine? They need to know if you have any of these conditions: -bleeding disorders -cigarette smoker -coronary artery bypass graft (CABG) surgery within the past 2 weeks -drink more than 3 alcohol-containing drinks per day -heart disease -high blood pressure -history of stomach bleeding -kidney disease -liver disease -lung or breathing disease, like asthma -stomach or intestine problems -an unusual or allergic reaction to meloxicam, aspirin, other NSAIDs, other medicines, foods, dyes, or preservatives -pregnant or trying to get pregnant -breast-feeding How should I use this medicine? Take this medicine by mouth with a full glass of water. Follow the directions on the prescription label. You can take it with or without food. If it upsets your stomach, take it with food. Take your medicine at regular intervals. Do not take it more often than directed. Do not stop taking except on your doctor's advice. A special MedGuide will be given to you by the pharmacist with each prescription and refill. Be sure to read this information carefully each time. Talk to your pediatrician regarding the use of this medicine in children. While this drug may be prescribed for selected conditions, precautions do apply. Patients over 17 years old may have a stronger reaction and need a smaller dose. Overdosage: If you think you have taken too much of this medicine contact a poison control center or emergency room at once. NOTE: This medicine is only for you. Do not share this medicine with others. What if I miss a dose? If you miss a dose, take it as soon as you can. If it is almost time for your next dose, take only that dose. Do not take double or extra doses. What may interact with this medicine? Do not take this medicine with any of the following medications: -cidofovir -ketorolac This medicine may also interact with the following medications: -aspirin and aspirin-like  medicines -certain medicines for blood pressure, heart disease, irregular heart beat -certain medicines for depression, anxiety, or psychotic disturbances -certain medicines that treat or prevent blood clots like warfarin, enoxaparin, dalteparin, apixaban, dabigatran, rivaroxaban -cyclosporine -digoxin -diuretics -methotrexate -other NSAIDs, medicines for pain and inflammation, like ibuprofen and naproxen -pemetrexed This list may  not describe all possible interactions. Give your health care provider a list of all the medicines, herbs, non-prescription drugs, or dietary supplements you use. Also tell them if you smoke, drink alcohol, or use illegal drugs. Some items may interact with your medicine. What should I watch for while using this medicine? Tell your doctor or healthcare professional if your symptoms do not start to get better or if they get worse. Do not take other medicines that contain aspirin, ibuprofen, or naproxen with this medicine. Side effects such as stomach upset, nausea, or ulcers may be more likely to occur. Many medicines available without a prescription should not be taken with this medicine. This medicine can cause ulcers and bleeding in the stomach and intestines at any time during treatment. This can happen with no warning and may cause death. There is increased risk with taking this medicine for a long time. Smoking, drinking alcohol, older age, and poor health can also increase risks. Call your doctor right away if you have stomach pain or blood in your vomit or stool. This medicine does not prevent heart attack or stroke. In fact, this medicine may increase the chance of a heart attack or stroke. The chance may increase with longer use of this medicine and in people who have heart disease. If you take aspirin to prevent heart attack or stroke, talk with your doctor or health care professional. What side effects may I notice from receiving this medicine? Side effects that  you should report to your doctor or health care professional as soon as possible: -allergic reactions like skin rash, itching or hives, swelling of the face, lips, or tongue -nausea, vomiting -signs and symptoms of a blood clot such as breathing problems; changes in vision; chest pain; severe, sudden headache; pain, swelling, warmth in the leg; trouble speaking; sudden numbness or weakness of the face, arm, or leg -signs and symptoms of bleeding such as bloody or black, tarry stools; red or dark-brown urine; spitting up blood or brown material that looks like coffee grounds; red spots on the skin; unusual bruising or bleeding from the eye, gums, or nose -signs and symptoms of liver injury like dark yellow or brown urine; general ill feeling or flu-like symptoms; light-colored stools; loss of appetite; nausea; right upper belly pain; unusually weak or tired; yellowing of the eyes or skin -signs and symptoms of stroke like changes in vision; confusion; trouble speaking or understanding; severe headaches; sudden numbness or weakness of the face, arm, or leg; trouble walking; dizziness; loss of balance or coordination Side effects that usually do not require medical attention (report to your doctor or health care professional if they continue or are bothersome): -constipation -diarrhea -gas This list may not describe all possible side effects. Call your doctor for medical advice about side effects. You may report side effects to FDA at 1-800-FDA-1088. Where should I keep my medicine? Keep out of the reach of children. Store at room temperature between 15 and 30 degrees C (59 and 86 degrees F). Throw away any unused medicine after the expiration date. NOTE: This sheet is a summary. It may not cover all possible information. If you have questions about this medicine, talk to your doctor, pharmacist, or health care provider.  2018 [employer]vier/Gold Standard (2015-05-03 19:28:16) Sacroiliac (SI) Joint  Injection Patient Information  Description: The sacroiliac joint connects the scrum (very low back and tailbone) to the ilium (a pelvic bone which also forms half of the hip joint).  Normally this joint experiences  very little motion.  When this joint becomes inflamed or unstable low back and or hip and pelvis pain may result.  Injection of this joint with local anesthetics (numbing medicines) and steroids can provide diagnostic information and reduce pain.  This injection is performed with the aid of x-ray guidance into the tailbone area while you are lying on your stomach.   You may experience an electrical sensation down the leg while this is being done.  You may also experience numbness.  We also may ask if we are reproducing your normal pain during the injection.  Conditions which may be treated SI injection:   Low back, buttock, hip or leg pain  Preparation for the Injection:  1. Do not eat any solid food or dairy products within 8 hours of your appointment.  2. You may drink clear liquids up to 3 hours before appointment.  Clear liquids include water, black coffee, juice or soda.  No milk or cream please. 3. You may take your regular medications, including pain medications with a sip of water before your appointment.  Diabetics should hold regular insulin (if take separately) and take 1/2 normal NPH dose the morning of the procedure.  Carry some sugar containing items with you to your appointment. 4. A driver must accompany you and be prepared to drive you home after your procedure. 5. Bring all of your current medications with you. 6. An IV may be inserted and sedation may be given at the discretion of the physician. 7. A blood pressure cuff, EKG and other monitors will often be applied during the procedure.  Some patients may need to have extra oxygen administered for a short period.  8. You will be asked to provide medical information, including your allergies, prior to the procedure.   We must know immediately if you are taking blood thinners (like Coumadin/Warfarin) or if you are allergic to IV iodine contrast (dye).  We must know if you could possible be pregnant.  Possible side effects:   Bleeding from needle site  Infection (rare, may require surgery)  Nerve injury (rare)  Numbness & tingling (temporary)  A brief convulsion or seizure  Light-headedness (temporary)  Pain at injection site (several days)  Decreased blood pressure (temporary)  Weakness in the leg (temporary)   Call if you experience:   New onset weakness or numbness of an extremity below the injection site that last more than 8 hours.  Hives or difficulty breathing ( go to the emergency room)  Inflammation or drainage at the injection site  Any new symptoms which are concerning to you  Please note:  Although the local anesthetic injected can often make your back/ hip/ buttock/ leg feel good for several hours after the injections, the pain will likely return.  It takes 3-7 days for steroids to work in the sacroiliac area.  You may not notice any pain relief for at least that one week.  If effective, we will often do a series of three injections spaced 3-6 weeks apart to maximally decrease your pain.  After the initial series, we generally will wait some months before a repeat injection of the same type.  If you have any questions, please call [phone] [facility]  Epidural Steroid Injection Patient Information  Description: The epidural space surrounds the nerves as they exit the spinal cord.  In some patients, the nerves can be compressed and inflamed by a bulging disc or a tight spinal canal (spinal stenosis).  By injecting steroids into the epidural space, we can bring irritated nerves into direct contact with a potentially helpful medication.  These steroids act directly on the irritated nerves and can reduce swelling and inflammation which  often leads to decreased pain.  Epidural steroids may be injected anywhere along the spine and from the neck to the low back depending upon the location of your pain.   After numbing the skin with local anesthetic (like Novocaine), a small needle is passed into the epidural space slowly.  You may experience a sensation of pressure while this is being done.  The entire block usually last less than 10 minutes.  Conditions which may be treated by epidural steroids:   Low back and leg pain  Neck and arm pain  Spinal stenosis  Post-laminectomy syndrome  Herpes zoster (shingles) pain  Pain from compression fractures  Preparation for the injection:  9. Do not eat any solid food or dairy products within 8 hours of your appointment.  10. You may drink clear liquids up to 3 hours before appointment.  Clear liquids include water, black coffee, juice or soda.  No milk or cream please. 11. You may take your regular medication, including pain medications, with a sip of water before your appointment  Diabetics should hold regular insulin (if taken separately) and take 1/2 normal NPH dos the morning of the procedure.  Carry some sugar containing items with you to your appointment. 12. A driver must accompany you and be prepared to drive you home after your procedure.  68. Bring all your current medications with your. 14. An IV may be inserted and sedation may be given at the discretion of the physician.   15. A blood pressure cuff, EKG and other monitors will often be applied during the procedure.  Some patients may need to have extra oxygen administered for a short period. 79. You will be asked to provide medical information, including your allergies, prior to the procedure.  We must know immediately if you are taking blood thinners (like Coumadin/Warfarin)  Or if you are allergic to IV iodine contrast (dye). We must know if you could possible be pregnant.  Possible side-effects:  Bleeding from  needle site  Infection (rare, may require surgery)  Nerve injury (rare)  Numbness & tingling (temporary)  Difficulty urinating (rare, temporary)  Spinal headache ( a headache worse with upright posture)  Light -headedness (temporary)  Pain at injection site (several days)  Decreased blood pressure (temporary)  Weakness in arm/leg (temporary)  Pressure sensation in back/neck (temporary)  Call if you experience:  Fever/chills associated with headache or increased back/neck pain.  Headache worsened by an upright position.  New onset weakness or numbness of an extremity below the injection site  Hives or difficulty breathing (go to the emergency room)  Inflammation or drainage at the infection site  Severe back/neck pain  Any new symptoms which are concerning to you  Please note:  Although the local anesthetic injected can often make your back or neck feel good for several hours after the injection, the pain will likely return.  It takes 3-7 days for steroids to work in the epidural space.  You may not notice any pain relief for at least that one week.  If effective, we will often do a series of three injections spaced 3-6 weeks apart to maximally decrease your pain.  After the initial series, we generally will wait several months before considering a repeat injection of the same type.  If you have any questions, please call [phone] [facility]  Call if you experience:  Fever/chills associated with headache or increased back/neck pain Headache worsened by an upright position New onset, weakness or numbness of an extremity below the injection site Hives or difficulty breathing (go to the emergency room) Inflammation or drainage at the injection site(s) Severe back/neck pain greater than usual New symptoms which are concerning to you  Please note:  Although the local anesthetic injected can often make your back or neck feel  good for several hours after the injection, the pain will likely return. It takes 3-7 days for steroids to work.  You may not notice any pain relief for at least one week.  If effective, we will often do a series of 2-3 injections spaced 3-6 weeks apart to maximally decrease your pain.  After the initial series, you may be a candidate for a more permanent nerve block of the facets.  If you have any questions, please call #[phone]) [facility] What is this medicine? METHOCARBAMOL (meth oh KAR ba mole) helps to relieve pain and stiffness in muscles caused by strains, sprains, or other injury to your muscles. This medicine may be used for other purposes; ask your health care provider or pharmacist if you have questions. COMMON BRAND NAME(S): Robaxin What should I tell my health care provider before I take this medicine? They need to know if you have any of these conditions: -kidney disease -seizures -an unusual or allergic reaction to methocarbamol, other medicines, foods, dyes, or preservatives -pregnant or trying to get pregnant -breast-feeding How should I use this medicine? Take this medicine by mouth with a full glass of water. Follow the directions on the prescription label. Take your medicine at regular intervals. Do not take your medicine more often than directed. Talk to your pediatrician regarding the use of this medicine in children. Special care may be needed. Overdosage: If you think you have taken too much of this medicine contact a poison control center or emergency room at once. NOTE: This medicine is only for you. Do not share this medicine with others. What if I miss a dose? If you miss a dose, take it as soon as you can. If it is almost time for your next dose, take only the next dose. Do not take double or extra doses. What may interact with this medicine? Do not take this medication with any of the following  medicines: -narcotic medicines for cough This medicine may also interact with the following medications: -alcohol -antihistamines for allergy, cough and cold -certain medicines for anxiety or sleep -certain medicines for depression like amitriptyline, fluoxetine, sertraline -certain medicines for seizures like phenobarbital, primidone -cholinesterase inhibitors like neostigmine, ambenonium, and pyridostigmine bromide -general anesthetics like halothane, isoflurane, methoxyflurane, propofol -local anesthetics like lidocaine, pramoxine, tetracaine -medicines that relax muscles for surgery -narcotic medicines for pain -phenothiazines like chlorpromazine, mesoridazine, prochlorperazine, thioridazine This list may not describe all possible interactions. Give your health care provider a list of all the medicines, herbs, non-prescription drugs, or dietary supplements you use. Also tell them if you smoke, drink alcohol, or use illegal drugs. Some items may interact with your medicine. What should I watch for while using this medicine? Tell your doctor or health care professional if your symptoms do not start to get better or if they get worse. You may get drowsy or dizzy. Do not drive, use machinery, or do anything that  needs mental alertness until you know how this medicine affects you. Do not stand or sit up quickly, especially if you are an older patient. This reduces the risk of dizzy or fainting spells. Alcohol may interfere with the effect of this medicine. Avoid alcoholic drinks. If you are taking another medicine that also causes drowsiness, you may have more side effects. Give your health care provider a list of all medicines you use. Your doctor will tell you how much medicine to take. Do not take more medicine than directed. Call emergency for help if you have problems breathing or unusual sleepiness. What side effects may I notice from receiving this medicine? Side effects that you should  report to your doctor or health care professional as soon as possible: -allergic reactions like skin rash, itching or hives, swelling of the face, lips, or tongue -breathing problems -confusion -seizures -unusually weak or tired Side effects that usually do not require medical attention (report to your doctor or health care professional if they continue or are bothersome): -dizziness -headache -metallic taste -tiredness -upset stomach This list may not describe all possible side effects. Call your doctor for medical advice about side effects. You may report side effects to FDA at 1-800-FDA-1088. Where should I keep my medicine? Keep out of the reach of children. Store at room temperature between 20 and 25 degrees C (68 and 77 degrees F). Keep container tightly closed. Throw away any unused medicine after the expiration date. NOTE: This sheet is a summary. It may not cover all possible information. If you have questions about this medicine, talk to your doctor, pharmacist, or health care provider.  2018 [employer]vier/Gold Standard (2015-01-10 13:11:54) .

## 2016-07-09 NOTE — Progress Notes (Signed)
Nursing Pain Medication Assessment:  Safety precautions to be maintained throughout the outpatient stay will include: orient to surroundings, keep bed in low position, maintain call bell within reach at all times, provide assistance with transfer out of bed and ambulation.  Medication Inspection Compliance: Pill count conducted under aseptic conditions, in front of the patient. Neither the pills nor the bottle was removed from the patient's sight at any time. Once count was completed pills were immediately returned to the patient in their original bottle.  Medication: See above Pill/Patch Count: 0 of 90 pills remain Bottle Appearance: Standard pharmacy container. Clearly labeled. Filled Date: 4711 / 06 / 2017 Last Medication intake:  Today

## 2016-07-09 NOTE — Patient Instructions (Addendum)
You have been given meloxicam and robaxin at your pharmacy to pick up.  You have3 procedures ordered if you need them.  Instructions have been given how to prepare for the procedure when having sedation.  Refer to information below for refresher.     Preparing for Procedure with Sedation Instructions: . Oral Intake: Do not eat or drink anything for at least 8 hours prior to your procedure. . Transportation: Public transportation is not allowed. Bring an adult driver. The driver must be physically present in our waiting room before any procedure can be started. Marland Kitchen Physical Assistance: Bring an adult capable of physically assisting you, in the event you need help. . Blood Pressure Medicine: Take your blood pressure medicine with a sip of water the morning of the procedure. . Insulin: Take only  of your normal insulin dose. . Preventing infections: Shower with an antibacterial soap the morning of your procedure. . Build-up your immune system: Take 1000 mg of Vitamin C with every meal (3 times a day) the day prior to your procedure. . Pregnancy: If you are pregnant, call and cancel the procedure. . Sickness: If you have a cold, fever, or any active infections, call and cancel the procedure. . Arrival: You must be in the facility at least 30 minutes prior to your scheduled procedure. . Children: Do not bring children with you. . Dress appropriately: Bring dark clothing that you would not mind if they get stained. . Valuables: Do not bring any jewelry or valuables. Procedure appointments are reserved for interventional treatments only. Marland Kitchen No Prescription Refills. . No medication changes will be discussed during procedure appointments. No disability issues will be discussed.Epidural Steroid Injection Patient Information  Description: The epidural space surrounds the nerves as they exit the spinal cord.  In some patients, the nerves can be compressed and inflamed by a bulging disc or a tight spinal  canal (spinal stenosis).  By injecting steroids into the epidural space, we can bring irritated nerves into direct contact with a potentially helpful medication.  These steroids act directly on the irritated nerves and can reduce swelling and inflammation which often leads to decreased pain.  Epidural steroids may be injected anywhere along the spine and from the neck to the low back depending upon the location of your pain.   After numbing the skin with local anesthetic (like Novocaine), a small needle is passed into the epidural space slowly.  You may experience a sensation of pressure while this is being done.  The entire block usually last less than 10 minutes.  Conditions which may be treated by epidural steroids:  Low back and leg pain Neck and arm pain Spinal stenosis Post-laminectomy syndrome Herpes zoster (shingles) pain Pain from compression fractures  Preparation for the injection:  Do not eat any solid food or dairy products within 8 hours of your appointment.  You may drink clear liquids up to 3 hours before appointment.  Clear liquids include water, black coffee, juice or soda.  No milk or cream please. You may take your regular medication, including pain medications, with a sip of water before your appointment  Diabetics should hold regular insulin (if taken separately) and take 1/2 normal NPH dos the morning of the procedure.  Carry some sugar containing items with you to your appointment. A driver must accompany you and be prepared to drive you home after your procedure.  Bring all your current medications with your. An IV may be inserted and sedation may be given at  the discretion of the physician.   A blood pressure cuff, EKG and other monitors will often be applied during the procedure.  Some patients may need to have extra oxygen administered for a short period. You will be asked to provide medical information, including your allergies, prior to the procedure.  We must know  immediately if you are taking blood thinners (like Coumadin/Warfarin)  Or if you are allergic to IV iodine contrast (dye). We must know if you could possible be pregnant.  Possible side-effects: Bleeding from needle site Infection (rare, may require surgery) Nerve injury (rare) Numbness & tingling (temporary) Difficulty urinating (rare, temporary) Spinal headache ( a headache worse with upright posture) Light -headedness (temporary) Pain at injection site (several days) Meloxicam tablets What is this medicine? MELOXICAM (mel OX i cam) is a non-steroidal anti-inflammatory drug (NSAID). It is used to reduce swelling and to treat pain. It may be used for osteoarthritis, rheumatoid arthritis, or juvenile rheumatoid arthritis. This medicine may be used for other purposes; ask your health care provider or pharmacist if you have questions. COMMON BRAND NAME(S): Mobic What should I tell my health care provider before I take this medicine? They need to know if you have any of these conditions: -bleeding disorders -cigarette smoker -coronary artery bypass graft (CABG) surgery within the past 2 weeks -drink more than 3 alcohol-containing drinks per day -heart disease -high blood pressure -history of stomach bleeding -kidney disease -liver disease -lung or breathing disease, like asthma -stomach or intestine problems -an unusual or allergic reaction to meloxicam, aspirin, other NSAIDs, other medicines, foods, dyes, or preservatives -pregnant or trying to get pregnant -breast-feeding How should I use this medicine? Take this medicine by mouth with a full glass of water. Follow the directions on the prescription label. You can take it with or without food. If it upsets your stomach, take it with food. Take your medicine at regular intervals. Do not take it more often than directed. Do not stop taking except on your doctor's advice. A special MedGuide will be given to you by the pharmacist with  each prescription and refill. Be sure to read this information carefully each time. Talk to your pediatrician regarding the use of this medicine in children. While this drug may be prescribed for selected conditions, precautions do apply. Patients over 74 years old may have a stronger reaction and need a smaller dose. Overdosage: If you think you have taken too much of this medicine contact a poison control center or emergency room at once. NOTE: This medicine is only for you. Do not share this medicine with others. What if I miss a dose? If you miss a dose, take it as soon as you can. If it is almost time for your next dose, take only that dose. Do not take double or extra doses. What may interact with this medicine? Do not take this medicine with any of the following medications: -cidofovir -ketorolac This medicine may also interact with the following medications: -aspirin and aspirin-like medicines -certain medicines for blood pressure, heart disease, irregular heart beat -certain medicines for depression, anxiety, or psychotic disturbances -certain medicines that treat or prevent blood clots like warfarin, enoxaparin, dalteparin, apixaban, dabigatran, rivaroxaban -cyclosporine -digoxin -diuretics -methotrexate -other NSAIDs, medicines for pain and inflammation, like ibuprofen and naproxen -pemetrexed This list may not describe all possible interactions. Give your health care provider a list of all the medicines, herbs, non-prescription drugs, or dietary supplements you use. Also tell them if you smoke, drink alcohol,  or use illegal drugs. Some items may interact with your medicine. What should I watch for while using this medicine? Tell your doctor or healthcare professional if your symptoms do not start to get better or if they get worse. Do not take other medicines that contain aspirin, ibuprofen, or naproxen with this medicine. Side effects such as stomach upset, nausea, or ulcers may  be more likely to occur. Many medicines available without a prescription should not be taken with this medicine. This medicine can cause ulcers and bleeding in the stomach and intestines at any time during treatment. This can happen with no warning and may cause death. There is increased risk with taking this medicine for a long time. Smoking, drinking alcohol, older age, and poor health can also increase risks. Call your doctor right away if you have stomach pain or blood in your vomit or stool. This medicine does not prevent heart attack or stroke. In fact, this medicine may increase the chance of a heart attack or stroke. The chance may increase with longer use of this medicine and in people who have heart disease. If you take aspirin to prevent heart attack or stroke, talk with your doctor or health care professional. What side effects may I notice from receiving this medicine? Side effects that you should report to your doctor or health care professional as soon as possible: -allergic reactions like skin rash, itching or hives, swelling of the face, lips, or tongue -nausea, vomiting -signs and symptoms of a blood clot such as breathing problems; changes in vision; chest pain; severe, sudden headache; pain, swelling, warmth in the leg; trouble speaking; sudden numbness or weakness of the face, arm, or leg -signs and symptoms of bleeding such as bloody or black, tarry stools; red or dark-brown urine; spitting up blood or brown material that looks like coffee grounds; red spots on the skin; unusual bruising or bleeding from the eye, gums, or nose -signs and symptoms of liver injury like dark yellow or brown urine; general ill feeling or flu-like symptoms; light-colored stools; loss of appetite; nausea; right upper belly pain; unusually weak or tired; yellowing of the eyes or skin -signs and symptoms of stroke like changes in vision; confusion; trouble speaking or understanding; severe headaches; sudden  numbness or weakness of the face, arm, or leg; trouble walking; dizziness; loss of balance or coordination Side effects that usually do not require medical attention (report to your doctor or health care professional if they continue or are bothersome): -constipation -diarrhea -gas This list may not describe all possible side effects. Call your doctor for medical advice about side effects. You may report side effects to FDA at 1-800-FDA-1088. Where should I keep my medicine? Keep out of the reach of children. Store at room temperature between 15 and 30 degrees C (59 and 86 degrees F). Throw away any unused medicine after the expiration date. NOTE: This sheet is a summary. It may not cover all possible information. If you have questions about this medicine, talk to your doctor, pharmacist, or health care provider.  2018 [employer]/Gold Standard (2015-05-03 19:28:16) Sacroiliac (SI) Joint Injection Patient Information  Description: The sacroiliac joint connects the scrum (very low back and tailbone) to the ilium (a pelvic bone which also forms half of the hip joint).  Normally this joint experiences very little motion.  When this joint becomes inflamed or unstable low back and or hip and pelvis pain may result.  Injection of this joint with local anesthetics (numbing medicines) and  steroids can provide diagnostic information and reduce pain.  This injection is performed with the aid of x-ray guidance into the tailbone area while you are lying on your stomach.   You may experience an electrical sensation down the leg while this is being done.  You may also experience numbness.  We also may ask if we are reproducing your normal pain during the injection.  Conditions which may be treated SI injection:   Low back, buttock, hip or leg pain  Preparation for the Injection:  1. Do not eat any solid food or dairy products within 8 hours of your appointment.  2. You may drink clear liquids up to 3 hours  before appointment.  Clear liquids include water, black coffee, juice or soda.  No milk or cream please. 3. You may take your regular medications, including pain medications with a sip of water before your appointment.  Diabetics should hold regular insulin (if take separately) and take 1/2 normal NPH dose the morning of the procedure.  Carry some sugar containing items with you to your appointment. 4. A driver must accompany you and be prepared to drive you home after your procedure. 5. Bring all of your current medications with you. 6. An IV may be inserted and sedation may be given at the discretion of the physician. 7. A blood pressure cuff, EKG and other monitors will often be applied during the procedure.  Some patients may need to have extra oxygen administered for a short period.  8. You will be asked to provide medical information, including your allergies, prior to the procedure.  We must know immediately if you are taking blood thinners (like Coumadin/Warfarin) or if you are allergic to IV iodine contrast (dye).  We must know if you could possible be pregnant.  Possible side effects:   Bleeding from needle site  Infection (rare, may require surgery)  Nerve injury (rare)  Numbness & tingling (temporary)  A brief convulsion or seizure  Light-headedness (temporary)  Pain at injection site (several days)  Decreased blood pressure (temporary)  Weakness in the leg (temporary)   Call if you experience:   New onset weakness or numbness of an extremity below the injection site that last more than 8 hours.  Hives or difficulty breathing ( go to the emergency room)  Inflammation or drainage at the injection site  Any new symptoms which are concerning to you  Please note:  Although the local anesthetic injected can often make your back/ hip/ buttock/ leg feel good for several hours after the injections, the pain will likely return.  It takes 3-7 days for steroids to work in  the sacroiliac area.  You may not notice any pain relief for at least that one week.  If effective, we will often do a series of three injections spaced 3-6 weeks apart to maximally decrease your pain.  After the initial series, we generally will wait some months before a repeat injection of the same type.  If you have any questions, please call [phone] [facility]  Epidural Steroid Injection Patient Information  Description: The epidural space surrounds the nerves as they exit the spinal cord.  In some patients, the nerves can be compressed and inflamed by a bulging disc or a tight spinal canal (spinal stenosis).  By injecting steroids into the epidural space, we can bring irritated nerves into direct contact with a potentially helpful medication.  These steroids act directly on the irritated nerves and  can reduce swelling and inflammation which often leads to decreased pain.  Epidural steroids may be injected anywhere along the spine and from the neck to the low back depending upon the location of your pain.   After numbing the skin with local anesthetic (like Novocaine), a small needle is passed into the epidural space slowly.  You may experience a sensation of pressure while this is being done.  The entire block usually last less than 10 minutes.  Conditions which may be treated by epidural steroids:   Low back and leg pain  Neck and arm pain  Spinal stenosis  Post-laminectomy syndrome  Herpes zoster (shingles) pain  Pain from compression fractures  Preparation for the injection:  9. Do not eat any solid food or dairy products within 8 hours of your appointment.  10. You may drink clear liquids up to 3 hours before appointment.  Clear liquids include water, black coffee, juice or soda.  No milk or cream please. 11. You may take your regular medication, including pain medications, with a sip of water before your appointment  Diabetics should  hold regular insulin (if taken separately) and take 1/2 normal NPH dos the morning of the procedure.  Carry some sugar containing items with you to your appointment. 12. A driver must accompany you and be prepared to drive you home after your procedure.  13. Bring all your current medications with your. 14. An IV may be inserted and sedation may be given at the discretion of the physician.   15. A blood pressure cuff, EKG and other monitors will often be applied during the procedure.  Some patients may need to have extra oxygen administered for a short period. 16. You will be asked to provide medical information, including your allergies, prior to the procedure.  We must know immediately if you are taking blood thinners (like Coumadin/Warfarin)  Or if you are allergic to IV iodine contrast (dye). We must know if you could possible be pregnant.  Possible side-effects:  Bleeding from needle site  Infection (rare, may require surgery)  Nerve injury (rare)  Numbness & tingling (temporary)  Difficulty urinating (rare, temporary)  Spinal headache ( a headache worse with upright posture)  Light -headedness (temporary)  Pain at injection site (several days)  Decreased blood pressure (temporary)  Weakness in arm/leg (temporary)  Pressure sensation in back/neck (temporary)  Call if you experience:  Fever/chills associated with headache or increased back/neck pain.  Headache worsened by an upright position.  New onset weakness or numbness of an extremity below the injection site  Hives or difficulty breathing (go to the emergency room)  Inflammation or drainage at the infection site  Severe back/neck pain  Any new symptoms which are concerning to you  Please note:  Although the local anesthetic injected can often make your back or neck feel good for several hours after the injection, the pain will likely return.  It takes 3-7 days for steroids to work in the epidural space.  You  may not notice any pain relief for at least that one week.  If effective, we will often do a series of three injections spaced 3-6 weeks apart to maximally decrease your pain.  After the initial series, we generally will wait several months before considering a repeat injection of the same type.  If you have any questions, please call [phone] [facility]  Call if you experience:  Fever/chills associated with headache or increased back/neck pain Headache  worsened by an upright position New onset, weakness or numbness of an extremity below the injection site Hives or difficulty breathing (go to the emergency room) Inflammation or drainage at the injection site(s) Severe back/neck pain greater than usual New symptoms which are concerning to you  Please note:  Although the local anesthetic injected can often make your back or neck feel good for several hours after the injection, the pain will likely return. It takes 3-7 days for steroids to work.  You may not notice any pain relief for at least one week.  If effective, we will often do a series of 2-3 injections spaced 3-6 weeks apart to maximally decrease your pain.  After the initial series, you may be a candidate for a more permanent nerve block of the facets.  If you have any questions, please call [phone] [facility] What is this medicine? METHOCARBAMOL (meth oh KAR ba mole) helps to relieve pain and stiffness in muscles caused by strains, sprains, or other injury to your muscles. This medicine may be used for other purposes; ask your health care provider or pharmacist if you have questions. COMMON BRAND NAME(S): Robaxin What should I tell my health care provider before I take this medicine? They need to know if you have any of these conditions: -kidney disease -seizures -an unusual or allergic reaction to methocarbamol, other  medicines, foods, dyes, or preservatives -pregnant or trying to get pregnant -breast-feeding How should I use this medicine? Take this medicine by mouth with a full glass of water. Follow the directions on the prescription label. Take your medicine at regular intervals. Do not take your medicine more often than directed. Talk to your pediatrician regarding the use of this medicine in children. Special care may be needed. Overdosage: If you think you have taken too much of this medicine contact a poison control center or emergency room at once. NOTE: This medicine is only for you. Do not share this medicine with others. What if I miss a dose? If you miss a dose, take it as soon as you can. If it is almost time for your next dose, take only the next dose. Do not take double or extra doses. What may interact with this medicine? Do not take this medication with any of the following medicines: -narcotic medicines for cough This medicine may also interact with the following medications: -alcohol -antihistamines for allergy, cough and cold -certain medicines for anxiety or sleep -certain medicines for depression like amitriptyline, fluoxetine, sertraline -certain medicines for seizures like phenobarbital, primidone -cholinesterase inhibitors like neostigmine, ambenonium, and pyridostigmine bromide -general anesthetics like halothane, isoflurane, methoxyflurane, propofol -local anesthetics like lidocaine, pramoxine, tetracaine -medicines that relax muscles for surgery -narcotic medicines for pain -phenothiazines like chlorpromazine, mesoridazine, prochlorperazine, thioridazine This list may not describe all possible interactions. Give your health care provider a list of all the medicines, herbs, non-prescription drugs, or dietary supplements you use. Also tell them if you smoke, drink alcohol, or use illegal drugs. Some items may interact with your medicine. What should I watch for while using this  medicine? Tell your doctor or health care professional if your symptoms do not start to get better or if they get worse. You may get drowsy or dizzy. Do not drive, use machinery, or do anything that needs mental alertness until you know how this medicine affects you. Do not stand or sit up quickly, especially if you are an older patient. This reduces the risk of  dizzy or fainting spells. Alcohol may interfere with the effect of this medicine. Avoid alcoholic drinks. If you are taking another medicine that also causes drowsiness, you may have more side effects. Give your health care provider a list of all medicines you use. Your doctor will tell you how much medicine to take. Do not take more medicine than directed. Call emergency for help if you have problems breathing or unusual sleepiness. What side effects may I notice from receiving this medicine? Side effects that you should report to your doctor or health care professional as soon as possible: -allergic reactions like skin rash, itching or hives, swelling of the face, lips, or tongue -breathing problems -confusion -seizures -unusually weak or tired Side effects that usually do not require medical attention (report to your doctor or health care professional if they continue or are bothersome): -dizziness -headache -metallic taste -tiredness -upset stomach This list may not describe all possible side effects. Call your doctor for medical advice about side effects. You may report side effects to FDA at 1-800-FDA-1088. Where should I keep my medicine? Keep out of the reach of children. Store at room temperature between 20 and 25 degrees C (68 and 77 degrees F). Keep container tightly closed. Throw away any unused medicine after the expiration date. NOTE: This sheet is a summary. It may not cover all possible information. If you have questions about this medicine, talk to your doctor, pharmacist, or health care provider.  2018 [employer]vier/[employer]  Standard (2015-01-10 13:11:54) .

## 2016-10-28 ENCOUNTER — Telehealth: Payer: Self-pay

## 2016-10-28 NOTE — Telephone Encounter (Signed)
Attempted to call patient.  Instructed her to call us back because we had questions about where her pain is so that we may order her procedure.

## 2016-10-28 NOTE — Telephone Encounter (Signed)
Pt wants to have a procedure done. She does not know which prn ordered procedure to schedule. Please call pt because she needs help decided what procedure to have

## 2016-11-01 NOTE — Telephone Encounter (Signed)
Attempted to call patient . Voice mail box is full and I am unable to leave a message.

## 2016-12-31 ENCOUNTER — Other Ambulatory Visit: Payer: Self-pay | Admitting: Family Medicine

## 2017-01-06 ENCOUNTER — Encounter: Payer: Self-pay | Admitting: *Deleted

## 2017-01-06 ENCOUNTER — Ambulatory Visit
Admission: EM | Admit: 2017-01-06 | Discharge: 2017-01-06 | Disposition: A | Discharge status: Home / Self Care | Payer: Medicare Other | Attending: Family Medicine | Admitting: Family Medicine

## 2017-01-06 ENCOUNTER — Ambulatory Visit: Payer: Medicare Other | Attending: Nurse Practitioner | Admitting: Nurse Practitioner

## 2017-01-06 DIAGNOSIS — R21 Rash and other nonspecific skin eruption: Secondary | ICD-10-CM | POA: Diagnosis not present

## 2017-01-06 DIAGNOSIS — L237 Allergic contact dermatitis due to plants, except food: Secondary | ICD-10-CM | POA: Diagnosis not present

## 2017-01-06 MED ORDER — PREDNISONE 20 MG PO TABS: ORAL_TABLET | ORAL | 0 refills | Status: DC

## 2017-01-06 NOTE — ED Provider Notes (Signed)
[facility]    CSN: [id number] Arrival date & time: 01/06/17  1432     History   Chief Complaint Chief Complaint  Patient presents with  . Rash    HPI Alice [name] is a 76 y.o. female.   The history is provided by the patient.  Rash  Location:  Head/neck, shoulder/arm and leg Head/neck rash location:  L neck and R neck Shoulder/arm rash location:  L forearm and R forearm Leg rash location:  R leg Quality: itchiness, redness and weeping   Severity:  Moderate Onset quality:  Sudden Duration:  2 days Timing:  Constant Progression:  Worsening Chronicity:  New Context: plant contact (poison oak)   Context: not animal contact, not insect bite/sting, not medications, not new detergent/soap, not nuts and not sick contacts   Relieved by:  None tried Ineffective treatments:  None tried Associated symptoms: no abdominal pain, no diarrhea, no fatigue, no fever, no headaches, no hoarse voice, no induration, no joint pain, no myalgias, no nausea, no periorbital edema, no shortness of breath, no sore throat, no throat swelling, no tongue swelling, no URI, not vomiting and not wheezing     Past Medical History:  Diagnosis Date  . Anxiety   . Arthritis   . Depression   . Fibromyalgia   . GERD (gastroesophageal reflux disease)   . History of carpal tunnel syndrome   . History of diverticulitis   . History of vertigo   . Hypercholesteremia   . Hypertension   . Migraine   . Neuralgia 2014  . Obesity   . Polymyalgia rheumatica [facility])     Patient Active Problem List   Diagnosis Date Noted  . Chronic pain syndrome 07/09/2016  . Musculoskeletal pain 07/09/2016  . Severe obesity (BMI 35.0-39.9) with comorbidity (HCC) 12/04/2015  . Chronic knee pain (S/P TKR) (Right) 12/04/2015  . Chronic knee pain (Left) 12/04/2015  . 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) (Right) 12/04/2015  . Osteoarthritis of knee (Left) 12/04/2015  . Carpal tunnel syndrome 11/23/2015    . Depression 11/23/2015  . Fibromyalgia 11/23/2015  . GERD (gastroesophageal reflux disease) 11/23/2015  . History of dysphagia 11/23/2015  . Hypothyroidism 11/23/2015  . Hyperlipidemia 11/23/2015  . Hypertension 11/23/2015  . Migraine headache 11/23/2015  . Osteoarthritis (arthritis due to wear and tear of joints) 11/23/2015  . Chronic low back pain (Location of Tertiary source of pain) (Bilateral) (L>R) 11/23/2015  . Chronic lower extremity pain (Bilateral) (L>R) 11/23/2015  . Chronic knee pain (Location of Primary Source of Pain) (Bilateral) (L>R) 11/23/2015  . Chronic hip pain (Location of Secondary source of pain) (Bilateral) (L>R) 11/23/2015  . 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) 11/23/2015  . Lumbar facet syndrome (Bilateral) (L>R) 11/23/2015  . Chronic sacroiliac joint pain (Bilateral) (L>R) 11/23/2015  . Long term current use of opiate analgesic 11/23/2015  . Long term prescription opiate use 11/23/2015  . Opiate use 11/23/2015  . Long term current use of non-steroidal anti-inflammatories (NSAID) 11/23/2015  . Disturbance of skin sensation 11/23/2015  . Diverticulosis 11/23/2015  . Neurogenic pain 11/23/2015  . 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 Encounter for pain management planning 11/23/2015  . Encounter for therapeutic drug level monitoring 11/23/2015  . Chronic neck pain 11/23/2015  . Lumbar spondylosis 11/23/2015  . Cervical spondylosis 11/23/2015  . History of TKR (total knee replacement) (Right) 11/23/2015  . Anxiety 08/25/2013  . Neuralgia 01/25/2013  . No transfusions per religious beliefs 02/27/2012  Past Surgical History:  Procedure Laterality Date  . ABDOMINAL HYSTERECTOMY    . BREAST CYST ASPIRATION Right +/- 10 yrs ago   x 2  . HERNIA REPAIR    . JOINT REPLACEMENT Right    knee 2015  . THYROIDECTOMY      OB History    No data available       Home Medications    Prior to Admission medications    Medication Sig Start Date End Date Taking? Authorizing Provider  [employer] D3 5000 units capsule TAKE 5,000 UNITS BY MOUTH ONCE DAILY. 10/10/15  Yes [provider]  gabapentin (NEURONTIN) 300 MG capsule Take 300 mg by mouth 3 (three) times daily. 09/23/15  Yes [provider]  LOSARTAN POTASSIUM PO Take 25 mg by mouth daily.   Yes [provider]  PARoxetine (PAXIL) 40 MG tablet Take 40 mg by mouth daily. 09/11/15  Yes [provider]  ranitidine (ZANTAC) 150 MG capsule Take 150 mg by mouth 2 (two) times daily. 09/13/15  Yes [provider]  simvastatin (ZOCOR) 20 MG tablet Take by mouth 2 (two) times daily.  05/09/15  Yes [provider]  ferrous sulfate 325 (65 FE) MG tablet Take 325 mg by mouth daily with breakfast.    [provider]  meclizine (ANTIVERT) 25 MG tablet Take 1 tablet (25 mg total) by mouth 3 (three) times daily as needed. 04/27/16   Jeanmarie Plant, MD  meloxicam (MOBIC) 15 MG tablet Take 1 tablet (15 mg total) by mouth daily. 07/09/16 01/05/17  Delano Metz, MD  methocarbamol (ROBAXIN) 500 MG tablet Take 1 tablet (500 mg total) by mouth every 8 (eight) hours as needed for muscle spasms. 07/09/16 01/05/17  Delano Metz, MD  predniSONE (DELTASONE) 20 MG tablet 3 tabs po once day 1, then 2 tabs po qd for 4 days, then 1 tab po qd for 3 days, then half a tab po qd for 2 days 01/06/17   Payton Mccallum, MD    Family History Family History  Problem Relation Age of Onset  . Arthritis Mother   . Arthritis Father   . COPD Father   . Arthritis Sister   . Diabetes Sister   . Cancer Brother   . COPD Brother   . Cancer Son   . Drug abuse Son   . Stroke Maternal Grandmother   . Arthritis Paternal Grandfather   . Breast cancer Neg Hx     Social History Social History  Substance Use Topics  . Smoking status: Former Games developer  . Smokeless tobacco: Never Used  . Alcohol use No     Allergies   Demerol  [meperidine]   Review of Systems Review of Systems  Constitutional: Negative for fatigue and fever.  HENT: Negative for hoarse voice and sore throat.   Respiratory: Negative for shortness of breath and wheezing.   Gastrointestinal: Negative for abdominal pain, diarrhea, nausea and vomiting.  Musculoskeletal: Negative for arthralgias and myalgias.  Skin: Positive for rash.  Neurological: Negative for headaches.     Physical Exam Triage Vital Signs ED Triage Vitals  Enc Vitals Group     BP 01/06/17 1504 133/87     Pulse Rate 01/06/17 1504 71     Resp 01/06/17 1504 16     Temp 01/06/17 1504 98.3 F (36.8 C)     Temp Source 01/06/17 1504 Oral     SpO2 01/06/17 1504 100 %     Weight 01/06/17 1505 190 lb (  86.2 kg)     Height 01/06/17 1505  (1.549 m)     Head Circumference --      Peak Flow --      Pain Score 01/06/17 1505 0     Pain Loc --      Pain Edu? --      Excl. in GC? --    No data found.   Updated Vital Signs BP 133/87 (BP Location: Left Arm)   Pulse 71   Temp 98.3 F (36.8 C) (Oral)   Resp 16   Ht  (1.549 m)   Wt 190 lb (86.2 kg)   SpO2 100%   BMI 35.90 kg/m   Visual Acuity Right Eye Distance:   Left Eye Distance:   Bilateral Distance:    Right Eye Near:   Left Eye Near:    Bilateral Near:     Physical Exam   UC Treatments / Results  Labs (all labs ordered are listed, but only abnormal results are displayed) Labs Reviewed - No data to display  EKG  EKG Interpretation None       Radiology No results found.  Procedures Procedures (including critical care time)  Medications Ordered in UC Medications - No data to display   Initial Impression / Assessment and Plan / UC Course  I have reviewed the triage vital signs and the nursing notes.  Pertinent labs & imaging results that were available during my care of the patient were reviewed by me and considered in my medical decision making (see chart for details).        Final Clinical Impressions(s) / UC Diagnoses   Final diagnoses:  Contact dermatitis due to poison oak    New Prescriptions Discharge Medication List as of 01/06/2017  3:58 PM    START taking these medications   Details  predniSONE (DELTASONE) 20 MG tablet 3 tabs po once day 1, then 2 tabs po qd for 4 days, then 1 tab po qd for 3 days, then half a tab po qd for 2 days, Normal       1. diagnosis reviewed with patient 2. rx as per orders above; reviewed possible side effects, interactions, risks and benefits  3. Recommend supportive treatment with otc antihistamines prn itching  4. Follow-up prn if symptoms worsen or don't improve  Controlled Substance Prescriptions [place] Controlled Substance Registry consulted? Not Applicable   Payton Mccallum, MD 01/06/17 [phone]

## 2017-01-06 NOTE — ED Triage Notes (Signed)
Patient was working in back yard when she came in contact possibly with poison ivy 3 days ago. Rash is located arms bilateral, neck and face.

## 2017-02-12 ENCOUNTER — Other Ambulatory Visit: Payer: Self-pay | Admitting: Family Medicine

## 2017-02-18 ENCOUNTER — Other Ambulatory Visit: Payer: Self-pay | Admitting: Family Medicine

## 2017-02-18 DIAGNOSIS — Z1231 Encounter for screening mammogram for malignant neoplasm of breast: Secondary | ICD-10-CM

## 2017-03-10 ENCOUNTER — Ambulatory Visit: Payer: Medicare Other

## 2017-03-25 ENCOUNTER — Ambulatory Visit: Payer: Medicare Other

## 2017-03-28 ENCOUNTER — Ambulatory Visit
Admission: RE | Admit: 2017-03-28 | Discharge: 2017-03-28 | Disposition: A | Discharge status: Home / Self Care | Payer: Medicare Other | Source: Ambulatory Visit | Attending: Family Medicine | Admitting: Family Medicine

## 2017-03-28 DIAGNOSIS — Z1231 Encounter for screening mammogram for malignant neoplasm of breast: Secondary | ICD-10-CM | POA: Diagnosis present

## 2017-05-07 ENCOUNTER — Other Ambulatory Visit: Payer: Self-pay | Admitting: Nurse Practitioner

## 2017-05-07 DIAGNOSIS — R1084 Generalized abdominal pain: Secondary | ICD-10-CM

## 2017-05-08 DIAGNOSIS — Z86010 Personal history of colonic polyps: Secondary | ICD-10-CM | POA: Insufficient documentation

## 2017-05-08 DIAGNOSIS — R1084 Generalized abdominal pain: Secondary | ICD-10-CM | POA: Insufficient documentation

## 2017-05-08 DIAGNOSIS — R350 Frequency of micturition: Secondary | ICD-10-CM | POA: Insufficient documentation

## 2017-05-08 DIAGNOSIS — R32 Unspecified urinary incontinence: Secondary | ICD-10-CM | POA: Insufficient documentation

## 2017-05-16 ENCOUNTER — Ambulatory Visit
Admission: RE | Admit: 2017-05-16 | Discharge: 2017-05-16 | Disposition: A | Discharge status: Home / Self Care | Payer: Medicare Other | Source: Ambulatory Visit | Attending: Nurse Practitioner | Admitting: Nurse Practitioner

## 2017-05-16 DIAGNOSIS — K5730 Diverticulosis of large intestine without perforation or abscess without bleeding: Secondary | ICD-10-CM | POA: Diagnosis not present

## 2017-05-16 DIAGNOSIS — R1084 Generalized abdominal pain: Secondary | ICD-10-CM | POA: Diagnosis not present

## 2017-05-16 DIAGNOSIS — N8189 Other female genital prolapse: Secondary | ICD-10-CM | POA: Diagnosis not present

## 2017-05-16 DIAGNOSIS — N8110 Cystocele, unspecified: Secondary | ICD-10-CM | POA: Insufficient documentation

## 2017-05-16 MED ORDER — IOPAMIDOL (ISOVUE-300) INJECTION 61%
100.0000 mL | Freq: Once | INTRAVENOUS | Status: AC | PRN
  Administered 2017-05-16: 100 mL via INTRAVENOUS

## 2017-07-03 DIAGNOSIS — G3184 Mild cognitive impairment, so stated: Secondary | ICD-10-CM | POA: Insufficient documentation

## 2017-07-16 ENCOUNTER — Ambulatory Visit: Payer: Medicare Other | Admitting: Anesthesiology

## 2017-07-16 ENCOUNTER — Encounter: Payer: Self-pay | Admitting: Anesthesiology

## 2017-07-16 ENCOUNTER — Encounter
Admission: RE | Disposition: A | Discharge status: Home / Self Care | Payer: Self-pay | Source: Ambulatory Visit | Attending: Unknown Physician Specialty

## 2017-07-16 ENCOUNTER — Ambulatory Visit
Admission: RE | Admit: 2017-07-16 | Discharge: 2017-07-16 | Disposition: A | Discharge status: Home / Self Care | Payer: Medicare Other | Source: Ambulatory Visit | Attending: Unknown Physician Specialty | Admitting: Unknown Physician Specialty

## 2017-07-16 DIAGNOSIS — Z1211 Encounter for screening for malignant neoplasm of colon: Secondary | ICD-10-CM | POA: Insufficient documentation

## 2017-07-16 DIAGNOSIS — E039 Hypothyroidism, unspecified: Secondary | ICD-10-CM | POA: Insufficient documentation

## 2017-07-16 DIAGNOSIS — M797 Fibromyalgia: Secondary | ICD-10-CM | POA: Insufficient documentation

## 2017-07-16 DIAGNOSIS — K640 First degree hemorrhoids: Secondary | ICD-10-CM | POA: Diagnosis not present

## 2017-07-16 DIAGNOSIS — Z79899 Other long term (current) drug therapy: Secondary | ICD-10-CM | POA: Diagnosis not present

## 2017-07-16 DIAGNOSIS — I10 Essential (primary) hypertension: Secondary | ICD-10-CM | POA: Diagnosis not present

## 2017-07-16 DIAGNOSIS — Z87891 Personal history of nicotine dependence: Secondary | ICD-10-CM | POA: Insufficient documentation

## 2017-07-16 DIAGNOSIS — K5730 Diverticulosis of large intestine without perforation or abscess without bleeding: Secondary | ICD-10-CM | POA: Insufficient documentation

## 2017-07-16 DIAGNOSIS — M353 Polymyalgia rheumatica: Secondary | ICD-10-CM | POA: Diagnosis not present

## 2017-07-16 DIAGNOSIS — K219 Gastro-esophageal reflux disease without esophagitis: Secondary | ICD-10-CM | POA: Diagnosis not present

## 2017-07-16 DIAGNOSIS — E7800 Pure hypercholesterolemia, unspecified: Secondary | ICD-10-CM | POA: Diagnosis not present

## 2017-07-16 DIAGNOSIS — Z86010 Personal history of colonic polyps: Secondary | ICD-10-CM | POA: Insufficient documentation

## 2017-07-16 DIAGNOSIS — F419 Anxiety disorder, unspecified: Secondary | ICD-10-CM | POA: Insufficient documentation

## 2017-07-16 DIAGNOSIS — F329 Major depressive disorder, single episode, unspecified: Secondary | ICD-10-CM | POA: Diagnosis not present

## 2017-07-16 HISTORY — PX: COLONOSCOPY WITH PROPOFOL: SHX5780

## 2017-07-16 HISTORY — DX: Personal history of other specified conditions: Z87.898

## 2017-07-16 HISTORY — DX: Personal history of other diseases of the digestive system: Z87.19

## 2017-07-16 HISTORY — DX: Hypothyroidism, unspecified: E03.9

## 2017-07-16 SURGERY — COLONOSCOPY WITH PROPOFOL
Anesthesia: General

## 2017-07-16 MED ORDER — PROPOFOL 500 MG/50ML IV EMUL
INTRAVENOUS | Status: AC
  Filled 2017-07-16: qty 50

## 2017-07-16 MED ORDER — LIDOCAINE 2% (20 MG/ML) 5 ML SYRINGE
INTRAMUSCULAR | Status: DC | PRN
  Administered 2017-07-16: 30 mg via INTRAVENOUS

## 2017-07-16 MED ORDER — PROPOFOL 500 MG/50ML IV EMUL
INTRAVENOUS | Status: DC | PRN
  Administered 2017-07-16: 140 ug/kg/min via INTRAVENOUS

## 2017-07-16 MED ORDER — FENTANYL CITRATE (PF) 100 MCG/2ML IJ SOLN
INTRAMUSCULAR | Status: DC | PRN
  Administered 2017-07-16 (×2): 50 ug via INTRAVENOUS

## 2017-07-16 MED ORDER — PIPERACILLIN-TAZOBACTAM 3.375 G IVPB
INTRAVENOUS | Status: AC
  Administered 2017-07-16: 3.375 g
  Filled 2017-07-16: qty 50

## 2017-07-16 MED ORDER — FENTANYL CITRATE (PF) 100 MCG/2ML IJ SOLN
INTRAMUSCULAR | Status: AC
  Filled 2017-07-16: qty 2

## 2017-07-16 MED ORDER — SODIUM CHLORIDE 0.9 % IV SOLN: INTRAVENOUS | Status: DC

## 2017-07-16 MED ORDER — PROPOFOL 10 MG/ML IV BOLUS
INTRAVENOUS | Status: DC | PRN
  Administered 2017-07-16: 100 mg via INTRAVENOUS

## 2017-07-16 MED ORDER — SODIUM CHLORIDE 0.9 % IV SOLN
INTRAVENOUS | Status: DC
  Administered 2017-07-16: 1000 mL via INTRAVENOUS

## 2017-07-16 MED ORDER — LIDOCAINE HCL (PF) 2 % IJ SOLN
INTRAMUSCULAR | Status: AC
  Filled 2017-07-16: qty 10

## 2017-07-16 MED ORDER — PHENYLEPHRINE HCL 10 MG/ML IJ SOLN
INTRAMUSCULAR | Status: DC | PRN
  Administered 2017-07-16: 100 ug via INTRAVENOUS

## 2017-07-16 NOTE — H&P (Signed)
Primary Care Physician:  Dione Housekeeper, MD Primary Gastroenterologist:  Dr. Mechele Collin  Pre-Procedure History & Physical: HPI:  Alice [name] is a 77 y.o. female is here for an colonoscopy done for personal history of colon polyps.   Past Medical History:  Diagnosis Date  . Anxiety   . Arthritis   . Depression   . Fibromyalgia   . GERD (gastroesophageal reflux disease)   . History of carpal tunnel syndrome   . History of diverticulitis   . History of dysphagia   . History of vertigo   . Hypercholesteremia   . Hypertension   . Hypothyroidism   . Migraine   . Neuralgia 2014  . Obesity   . Polymyalgia rheumatica (HCC)     Past Surgical History:  Procedure Laterality Date  . ABDOMINAL HYSTERECTOMY    . BREAST CYST ASPIRATION Right +/- 10 yrs ago   x 2  . CHOLECYSTECTOMY    . COLON SURGERY    . COLONOSCOPY  09/08/2006  . ESOPHAGOGASTRODUODENOSCOPY  01/24/2009  . HERNIA REPAIR    . JOINT REPLACEMENT Right    knee 2015  . THYROIDECTOMY      Prior to Admission medications   Medication Sig Start Date End Date Taking? Authorizing Provider  [employer] D3 5000 units capsule TAKE 5,000 UNITS BY MOUTH ONCE DAILY. 10/10/15   [provider]  gabapentin (NEURONTIN) 400 MG capsule Take 400 mg by mouth 3 (three) times daily.  09/23/15   [provider]  LOSARTAN POTASSIUM PO Take 25 mg by mouth daily.    [provider]  meclizine (ANTIVERT) 25 MG tablet Take 1 tablet (25 mg total) by mouth 3 (three) times daily as needed. 04/27/16   Jeanmarie Plant, MD  PARoxetine (PAXIL) 40 MG tablet Take 40 mg by mouth daily. 09/11/15   [provider]  ranitidine (ZANTAC) 150 MG capsule Take 150 mg by mouth 2 (two) times daily. 09/13/15   [provider]  simvastatin (ZOCOR) 20 MG tablet Take by mouth 2 (two) times daily.  05/09/15   [provider]    Allergies as of 05/19/2017 - Review Complete 05/16/2017  Allergen Reaction Noted  .  Demerol [meperidine] Other (See Comments) 10/12/2015    Family History  Problem Relation Age of Onset  . Arthritis Mother   . Arthritis Father   . COPD Father   . Arthritis Sister   . Diabetes Sister   . Cancer Brother   . COPD Brother   . Cancer Son   . Drug abuse Son   . Stroke Maternal Grandmother   . Arthritis Paternal Grandfather   . Breast cancer Neg Hx     Social History   Socioeconomic History  . Marital status: Married    Spouse name: Not on file  . Number of children: Not on file  . Years of education: Not on file  . Highest education level: Not on file  Occupational History  . Not on file  Social Needs  . Financial resource strain: Not on file  . Food insecurity:    Worry: Not on file    Inability: Not on file  . Transportation needs:    Medical: Not on file    Non-medical: Not on file  Tobacco Use  . Smoking status: Former Smoker    Last attempt to quit: 02/26/1982    Years since quitting: 35.4  . Smokeless tobacco: Never Used  Substance and Sexual Activity  . Alcohol  use: No  . Drug use: No  . Sexual activity: Not on file  Lifestyle  . Physical activity:    Days per week: Not on file    Minutes per session: Not on file  . Stress: Not on file  Relationships  . Social connections:    Talks on phone: Not on file    Gets together: Not on file    Attends religious service: Not on file    Active member of club or organization: Not on file    Attends meetings of clubs or organizations: Not on file    Relationship status: Not on file  . Intimate partner violence:    Fear of current or ex partner: Not on file    Emotionally abused: Not on file    Physically abused: Not on file    Forced sexual activity: Not on file  Other Topics Concern  . Not on file  Social History Narrative  . Not on file    Review of Systems: See HPI, otherwise negative ROS  Physical Exam: BP 128/75   Pulse 68   Temp (!) 97.1 F (36.2 C) (Tympanic)   Resp 20   Ht  5' 0.25" (1.53 m)   Wt 89.8 kg (198 lb)   SpO2 96%   BMI 38.35 kg/m  General:   Alert,  pleasant and cooperative in NAD Head:  Normocephalic and atraumatic. Neck:  Supple; no masses or thyromegaly. Lungs:  Clear throughout to auscultation.    Heart:  Regular rate and rhythm. Abdomen:  Soft, nontender and nondistended. Normal bowel sounds, without guarding, and without rebound.   Neurologic:  Alert and  oriented x4;  grossly normal neurologically.  Impression/Plan: Alice [name] is here for an colonoscopy to be performed for personal history of colon polyps.  Risks, benefits, limitations, and alternatives regarding  colonoscopy have been reviewed with the patient.  Questions have been answered.  All parties agreeable.   Lynnae PrudeELLIOTT, ROBERT, MD  07/16/2017, 9:51 AM

## 2017-07-16 NOTE — Transfer of Care (Signed)
Immediate Anesthesia Transfer of Care Note  Patient: Alice CruiseSylvia Kay [name]  Procedure(s) Performed: COLONOSCOPY WITH PROPOFOL (N/A )  Patient Location: PACU and Endoscopy Unit  Anesthesia Type:General  Level of Consciousness: awake and patient cooperative  Airway & Oxygen Therapy: Patient Spontanous Breathing and Patient connected to nasal cannula oxygen  Post-op Assessment: Report given to RN and Post -op Vital signs reviewed and stable  Post vital signs: Reviewed and stable  Last Vitals:  Vitals Value Taken Time  BP    Temp    Pulse    Resp    SpO2      Last Pain:  Vitals:   07/16/17 0915  TempSrc: Tympanic  PainSc: 0-No pain         Complications: No apparent anesthesia complications

## 2017-07-16 NOTE — Op Note (Addendum)
[facility] Gastroenterology Patient Name: Alice [name] Procedure Date: 07/16/2017 9:55 AM MRN: [id number] Account #: [id number] Date of Birth: 11-18-1940 Admit Type: Outpatient Age: 77 Room: [facility] ENDO ROOM 1 Gender: Female Note Status: Finalized Procedure:            Colonoscopy Indications:          High risk colon cancer surveillance: Personal history                        of colonic polyps Providers:            Scot Jun, MD Referring MD:         Nat Christen. Zada Finders, MD (Referring MD) Medicines:            Propofol per Anesthesia Complications:        No immediate complications. Procedure:            Pre-Anesthesia Assessment:                       - After reviewing the risks and benefits, the patient                        was deemed in satisfactory condition to undergo the                        procedure.                       After obtaining informed consent, the colonoscope was                        passed under direct vision. Throughout the procedure,                        the patient's blood pressure, pulse, and oxygen                        saturations were monitored continuously. The                        Colonoscope was introduced through the anus and                        advanced to the the cecum, identified by appendiceal                        orifice and ileocecal valve. The colonoscopy was                        performed without difficulty. The patient tolerated the                        procedure well. The quality of the bowel preparation                        was good. Findings:      Multiple small and large-mouthed diverticula were found in the sigmoid       colon, descending colon, transverse colon and ascending colon. No polyps       seen.      Internal hemorrhoids were found during endoscopy. The hemorrhoids were  small, medium-sized and Grade I (internal hemorrhoids that do not       prolapse).      The exam was  otherwise without abnormality. Impression:           - Diverticulosis in the sigmoid colon, in the                        descending colon, in the transverse colon and in the                        ascending colon.                       - Internal hemorrhoids.                       - The examination was otherwise normal.                       - No specimens collected. Recommendation:       - The findings and recommendations were discussed with                        the patient's family. Scot Junobert T Elliott, MD 07/16/2017 10:27:29 AM This report has been signed electronically. Number of Addenda: 0 Note Initiated On: 07/16/2017 9:55 AM Scope Withdrawal Time: 0 hours 7 minutes 44 seconds  Total Procedure Duration: 0 hours 18 minutes 16 seconds       [facility]

## 2017-07-16 NOTE — Anesthesia Post-op Follow-up Note (Signed)
Anesthesia QCDR form completed.        

## 2017-07-16 NOTE — Anesthesia Postprocedure Evaluation (Signed)
Anesthesia Post Note  Patient: Alice [name]  Procedure(s) Performed: COLONOSCOPY WITH PROPOFOL (N/A )  Patient location during evaluation: Endoscopy Anesthesia Type: General Level of consciousness: awake and alert Pain management: pain level controlled Vital Signs Assessment: post-procedure vital signs reviewed and stable Respiratory status: spontaneous breathing, nonlabored ventilation, respiratory function stable and patient connected to nasal cannula oxygen Cardiovascular status: blood pressure returned to baseline and stable Postop Assessment: no apparent nausea or vomiting Anesthetic complications: no     Last Vitals:  Vitals:   07/16/17 1049 07/16/17 1059  BP: 103/60 (!) 106/91  Pulse: 67 60  Resp: 14 18  Temp:    SpO2: 100% 100%    Last Pain:  Vitals:   07/16/17 0915  TempSrc: Tympanic  PainSc: 0-No pain                 Orvan Papadakis S

## 2017-07-16 NOTE — Anesthesia Preprocedure Evaluation (Addendum)
Anesthesia Evaluation  Patient identified by MRN, date of birth, ID band Patient awake    Reviewed: Allergy & Precautions, NPO status , Patient's Chart, lab work & pertinent test results, reviewed documented beta blocker date and time   Airway Mallampati: III  TM Distance: >3 FB     Dental  (+) Chipped   Pulmonary former smoker,           Cardiovascular hypertension, Pt. on medications      Neuro/Psych  Headaches, PSYCHIATRIC DISORDERS Anxiety Depression  Neuromuscular disease    GI/Hepatic GERD  ,  Endo/Other  Hypothyroidism   Renal/GU      Musculoskeletal  (+) Arthritis , Fibromyalgia -  Abdominal   Peds  Hematology   Anesthesia Other Findings Obese.  Reproductive/Obstetrics                             Anesthesia Physical Anesthesia Plan  ASA: III  Anesthesia Plan: General   Post-op Pain Management:    Induction: Intravenous  PONV Risk Score and Plan:   Airway Management Planned:   Additional Equipment:   Intra-op Plan:   Post-operative Plan:   Informed Consent: I have reviewed the patients History and Physical, chart, labs and discussed the procedure including the risks, benefits and alternatives for the proposed anesthesia with the patient or authorized representative who has indicated his/her understanding and acceptance.     Plan Discussed with: CRNA  Anesthesia Plan Comments:         Anesthesia Quick Evaluation

## 2017-07-18 ENCOUNTER — Encounter: Payer: Self-pay | Admitting: Unknown Physician Specialty

## 2017-08-02 ENCOUNTER — Ambulatory Visit
Admission: EM | Admit: 2017-08-02 | Discharge: 2017-08-02 | Disposition: A | Discharge status: Home / Self Care | Payer: Medicare Other | Attending: Family Medicine | Admitting: Family Medicine

## 2017-08-02 ENCOUNTER — Encounter: Payer: Self-pay | Admitting: Gynecology

## 2017-08-02 ENCOUNTER — Other Ambulatory Visit: Payer: Self-pay

## 2017-08-02 DIAGNOSIS — Z87891 Personal history of nicotine dependence: Secondary | ICD-10-CM | POA: Diagnosis not present

## 2017-08-02 DIAGNOSIS — R300 Dysuria: Secondary | ICD-10-CM | POA: Diagnosis present

## 2017-08-02 DIAGNOSIS — Z885 Allergy status to narcotic agent status: Secondary | ICD-10-CM | POA: Diagnosis not present

## 2017-08-02 DIAGNOSIS — Z79899 Other long term (current) drug therapy: Secondary | ICD-10-CM | POA: Insufficient documentation

## 2017-08-02 DIAGNOSIS — N3001 Acute cystitis with hematuria: Secondary | ICD-10-CM | POA: Diagnosis not present

## 2017-08-02 DIAGNOSIS — M545 Low back pain: Secondary | ICD-10-CM | POA: Diagnosis present

## 2017-08-02 LAB — URINALYSIS, COMPLETE (UACMP) WITH MICROSCOPIC
Glucose, UA: NEGATIVE mg/dL
Nitrite: POSITIVE — AB
Protein, ur: 30 mg/dL — AB
SPECIFIC GRAVITY, URINE: 1.025 (ref 1.005–1.030)
pH: 5.5 (ref 5.0–8.0)

## 2017-08-02 MED ORDER — PHENAZOPYRIDINE HCL 200 MG PO TABS: 200.0000 mg | ORAL_TABLET | Freq: Three times a day (TID) | ORAL | 0 refills | Status: DC

## 2017-08-02 MED ORDER — CEPHALEXIN 500 MG PO CAPS: 500.0000 mg | ORAL_CAPSULE | Freq: Two times a day (BID) | ORAL | 0 refills | Status: DC

## 2017-08-02 NOTE — ED Provider Notes (Signed)
[facility]    CSN: [id number] Arrival date & time: 08/02/17  1434     History   Chief Complaint Chief Complaint  Patient presents with  . Urinary Tract Infection    HPI Alice [name] is a 77 y.o. female.   HPI  A 78 year old female with history of recurrent urinary tract infections presents with complaints of lower back pain and dysuria that she has had since yesterday.  In addition she has had frequency and urgency. Is that she had a colonoscopy a week ago and at that since that time she has had very loose stool and it has the inability to turn and wipe herself properly and completely which may have precipitated this urinary tract infection.  Eyes any fever or chills.  She has had no nausea vomiting.  She is also had low back pain she indicates the lower ribs on the right side unusual for her to have with a urinary tract infection.  She states that it is very tender to the touch and is worsened with certain motions.         Past Medical History:  Diagnosis Date  . Anxiety   . Arthritis   . Depression   . Fibromyalgia   . GERD (gastroesophageal reflux disease)   . History of carpal tunnel syndrome   . History of diverticulitis   . History of dysphagia   . History of vertigo   . Hypercholesteremia   . Hypertension   . Hypothyroidism   . Migraine   . Neuralgia 2014  . Obesity   . Polymyalgia rheumatica [facility])     Patient Active Problem List   Diagnosis Date Noted  . Chronic pain syndrome 07/09/2016  . Musculoskeletal pain 07/09/2016  . Severe obesity (BMI 35.0-39.9) with comorbidity (HCC) 12/04/2015  . Chronic knee pain (S/P TKR) (Right) 12/04/2015  . Chronic knee pain (Left) 12/04/2015  . 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) (Right) 12/04/2015  . Osteoarthritis of knee (Left) 12/04/2015  . Carpal tunnel syndrome 11/23/2015  . Depression 11/23/2015  . Fibromyalgia 11/23/2015  . GERD (gastroesophageal reflux disease) 11/23/2015  .  History of dysphagia 11/23/2015  . Hypothyroidism 11/23/2015  . Hyperlipidemia 11/23/2015  . Hypertension 11/23/2015  . Migraine headache 11/23/2015  . Osteoarthritis (arthritis due to wear and tear of joints) 11/23/2015  . Chronic low back pain (Location of Tertiary source of pain) (Bilateral) (L>R) 11/23/2015  . Chronic lower extremity pain (Bilateral) (L>R) 11/23/2015  . Chronic knee pain (Location of Primary Source of Pain) (Bilateral) (L>R) 11/23/2015  . Chronic hip pain (Location of Secondary source of pain) (Bilateral) (L>R) 11/23/2015  . 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) 11/23/2015  . Lumbar facet syndrome (Bilateral) (L>R) 11/23/2015  . Chronic sacroiliac joint pain (Bilateral) (L>R) 11/23/2015  . Long term current use of opiate analgesic 11/23/2015  . Long term prescription opiate use 11/23/2015  . Opiate use 11/23/2015  . Long term current use of non-steroidal anti-inflammatories (NSAID) 11/23/2015  . Disturbance of skin sensation 11/23/2015  . Diverticulosis 11/23/2015  . Neurogenic pain 11/23/2015  . 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 Encounter for pain management planning 11/23/2015  . Encounter for therapeutic drug level monitoring 11/23/2015  . Chronic neck pain 11/23/2015  . Lumbar spondylosis 11/23/2015  . Cervical spondylosis 11/23/2015  . History of TKR (total knee replacement) (Right) 11/23/2015  . Anxiety 08/25/2013  . Neuralgia 01/25/2013  . No transfusions per religious beliefs 02/27/2012  Past Surgical History:  Procedure Laterality Date  . ABDOMINAL HYSTERECTOMY    . BREAST CYST ASPIRATION Right +/- 10 yrs ago   x 2  . CHOLECYSTECTOMY    . COLON SURGERY    . COLONOSCOPY  09/08/2006  . COLONOSCOPY WITH PROPOFOL N/A 07/16/2017   Procedure: COLONOSCOPY WITH PROPOFOL;  Surgeon: Scot Jun, MD;  Location: [facility] ENDOSCOPY;  Service: Endoscopy;  Laterality: N/A;  . ESOPHAGOGASTRODUODENOSCOPY  01/24/2009  .  HERNIA REPAIR    . JOINT REPLACEMENT Right    knee 2015  . THYROIDECTOMY      OB History   None      Home Medications    Prior to Admission medications   Medication Sig Start Date End Date Taking? Authorizing Provider  [employer] D3 5000 units capsule TAKE 5,000 UNITS BY MOUTH ONCE DAILY. 10/10/15  Yes [provider]  gabapentin (NEURONTIN) 400 MG capsule Take 400 mg by mouth 3 (three) times daily.  09/23/15  Yes [provider]  LOSARTAN POTASSIUM PO Take 25 mg by mouth daily.   Yes [provider]  meclizine (ANTIVERT) 25 MG tablet Take 1 tablet (25 mg total) by mouth 3 (three) times daily as needed. 04/27/16  Yes Jeanmarie Plant, MD  PARoxetine (PAXIL) 40 MG tablet Take 40 mg by mouth daily. 09/11/15  Yes [provider]  ranitidine (ZANTAC) 150 MG capsule Take 150 mg by mouth 2 (two) times daily. 09/13/15  Yes [provider]  simvastatin (ZOCOR) 20 MG tablet Take by mouth 2 (two) times daily.  05/09/15  Yes [provider]  cephALEXin (KEFLEX) 500 MG capsule Take 1 capsule (500 mg total) by mouth 2 (two) times daily. 08/02/17   Lutricia Feil, PA-C  phenazopyridine (PYRIDIUM) 200 MG tablet Take 1 tablet (200 mg total) by mouth 3 (three) times daily. 08/02/17   Lutricia Feil, PA-C    Family History Family History  Problem Relation Age of Onset  . Arthritis Mother   . Arthritis Father   . COPD Father   . Arthritis Sister   . Diabetes Sister   . Cancer Brother   . COPD Brother   . Cancer Son   . Drug abuse Son   . Stroke Maternal Grandmother   . Arthritis Paternal Grandfather   . Breast cancer Neg Hx     Social History Social History   Tobacco Use  . Smoking status: Former Smoker    Last attempt to quit: 02/26/1982    Years since quitting: 35.4  . Smokeless tobacco: Never Used  Substance Use Topics  . Alcohol use: No  . Drug use: No     Allergies   Milk-related compounds; Peanut-containing drug products; and  Demerol [meperidine]   Review of Systems Review of Systems  Constitutional: Positive for activity change. Negative for appetite change, chills, diaphoresis, fatigue and fever.  Genitourinary: Positive for dysuria, flank pain, frequency and urgency. Negative for vaginal bleeding, vaginal discharge and vaginal pain.  All other systems reviewed and are negative.    Physical Exam Triage Vital Signs ED Triage Vitals  Enc Vitals Group     BP 08/02/17 1446 131/85     Pulse Rate 08/02/17 1446 84     Resp 08/02/17 1446 16     Temp 08/02/17 1446 (!) 97.5 F (36.4 C)     Temp src --      SpO2 08/02/17 1446 98 %     Weight 08/02/17 1449 198 lb (89.8  kg)     Height --      Head Circumference --      Peak Flow --      Pain Score 08/02/17 1449 3     Pain Loc --      Pain Edu? --      Excl. in GC? --    No data found.  Updated Vital Signs BP 131/85 (BP Location: Left Arm)   Pulse 84   Temp (!) 97.5 F (36.4 C)   Resp 16   Wt 198 lb (89.8 kg)   SpO2 98%   BMI 38.35 kg/m   Visual Acuity Right Eye Distance:   Left Eye Distance:   Bilateral Distance:    Right Eye Near:   Left Eye Near:    Bilateral Near:     Physical Exam  Constitutional: She is oriented to person, place, and time. She appears well-developed and well-nourished. No distress.  HENT:  Head: Normocephalic.  Eyes: Pupils are equal, round, and reactive to light.  Neck: Normal range of motion.  Pulmonary/Chest: Effort normal and breath sounds normal.  Abdominal: Soft. Bowel sounds are normal.  Patient has mild CVA tenderness over the right but is likely due to muscular skeletal pain.  Musculoskeletal: Normal range of motion.  Neurological: She is alert and oriented to person, place, and time.  Skin: Skin is warm and dry. She is not diaphoretic.  Psychiatric: She has a normal mood and affect. Her behavior is normal. Judgment and thought content normal.  Nursing note and vitals reviewed.    UC Treatments /  Results  Labs (all labs ordered are listed, but only abnormal results are displayed) Labs Reviewed  URINALYSIS, COMPLETE (UACMP) WITH MICROSCOPIC - Abnormal; Notable for the following components:      Result Value   APPearance CLOUDY (*)    Hgb urine dipstick MODERATE (*)    Bilirubin Urine SMALL (*)    Ketones, ur TRACE (*)    Protein, ur 30 (*)    Nitrite POSITIVE (*)    Leukocytes, UA MODERATE (*)    Squamous Epithelial / LPF 0-5 (*)    Bacteria, UA MANY (*)    All other components within normal limits  URINE CULTURE    EKG None Radiology No results found.  Procedures Procedures (including critical care time)  Medications Ordered in UC Medications - No data to display   Initial Impression / Assessment and Plan / UC Course  I have reviewed the triage vital signs and the nursing notes.  Pertinent labs & imaging results that were available during my care of the patient were reviewed by me and considered in my medical decision making (see chart for details).     Plan: 1. Test/x-ray results and diagnosis reviewed with patient 2. rx as per orders; risks, benefits, potential side effects reviewed with patient 3. Recommend supportive treatment with plenty of fluids.  Culture and sensitivities of the urine will be available in 48 hours.  If she is not improving she should follow-up with her primary care physician 4. F/u prn if symptoms worsen or don't improve   Final Clinical Impressions(s) / UC Diagnoses   Final diagnoses:  Acute cystitis with hematuria    ED Discharge Orders        Ordered    cephALEXin (KEFLEX) 500 MG capsule  2 times daily     08/02/17 1529    phenazopyridine (PYRIDIUM) 200 MG tablet  3 times daily     08/02/17  1529       Controlled Substance Prescriptions [place] Controlled Substance Registry consulted? Not Applicable   Lutricia FeilRoemer, Kourosh Jablonsky P, PA-C 08/02/17 1644

## 2017-08-02 NOTE — ED Triage Notes (Signed)
Patient c/o lower back pain / burning with urination x yesterday.

## 2017-08-04 ENCOUNTER — Telehealth (HOSPITAL_COMMUNITY): Payer: Self-pay

## 2017-08-04 LAB — URINE CULTURE: Culture: >=100000 — AB

## 2017-08-04 MED ORDER — SULFAMETHOXAZOLE-TRIMETHOPRIM 800-160 MG PO TABS: 1.0000 | ORAL_TABLET | Freq: Two times a day (BID) | ORAL | 0 refills | Status: AC

## 2017-08-04 NOTE — Telephone Encounter (Signed)
Culture positive for Enterobacter Species, resistant to Keflex given at urgent care. Attempted to contact patient regarding results. No answer at this time and no voicemail set up.  Prescription for Bactrim BID x 5 days sent to pharmacy on record. Will attempt to call patient again.

## 2017-08-06 ENCOUNTER — Telehealth (HOSPITAL_COMMUNITY): Payer: Self-pay

## 2017-08-06 NOTE — Telephone Encounter (Signed)
Attempted to reach patient x 2 

## 2017-08-07 ENCOUNTER — Telehealth (HOSPITAL_COMMUNITY): Payer: Self-pay

## 2017-08-07 NOTE — Telephone Encounter (Signed)
No answer x3

## 2018-01-09 IMAGING — CR DG LUMBAR SPINE COMPLETE W/ BEND
7 series · 7 of 7 positions shown · non-contrast
Comparison: 07/12/2014 lumbar spine MRI

CLINICAL DATA: Chronic low back pain.

EXAM:
LUMBAR SPINE - COMPLETE WITH BENDING VIEWS

[l-spine ap]
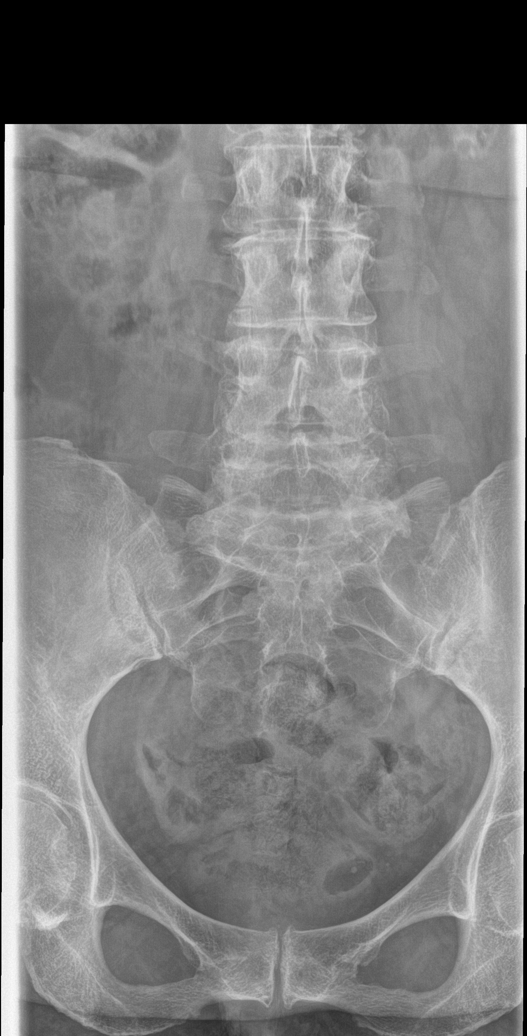

[l-spine obl (1 of 2)]
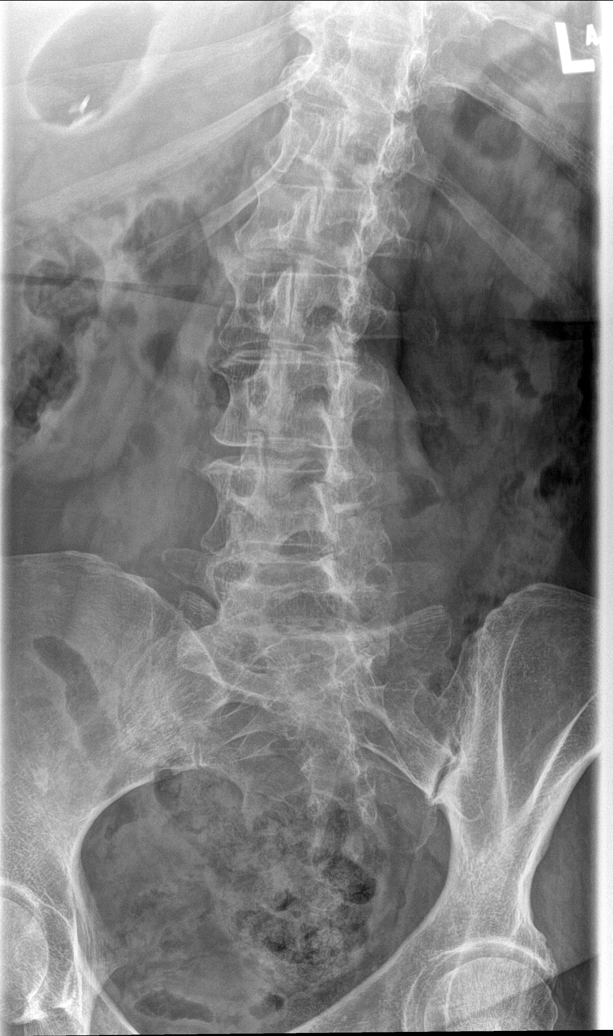

[l-spine obl (2 of 2)]
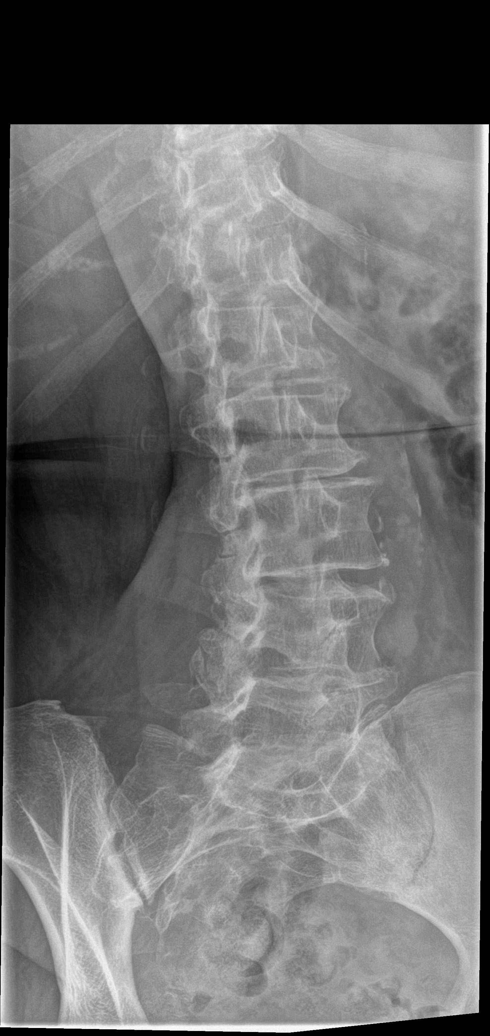

[l-spine lat]
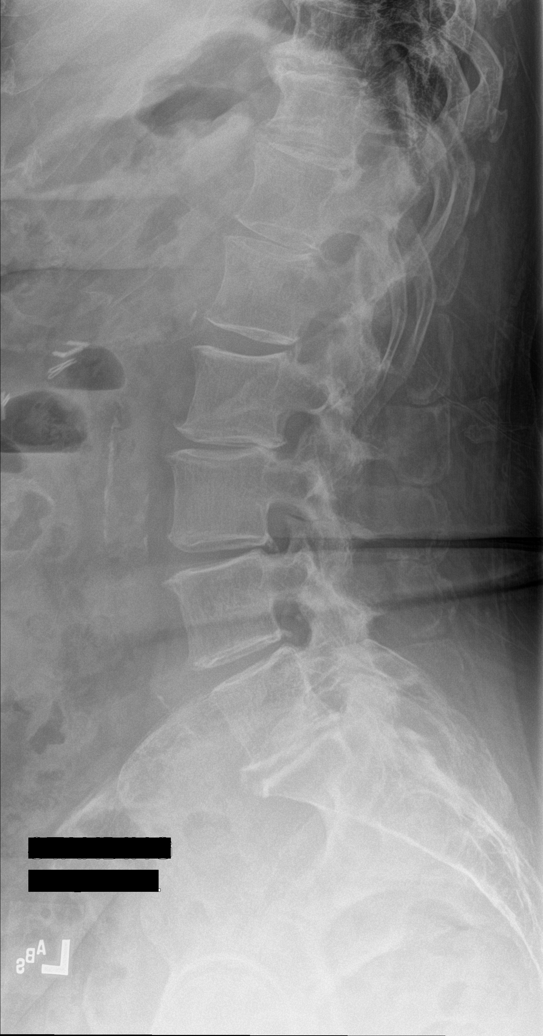

[l-spine flex]
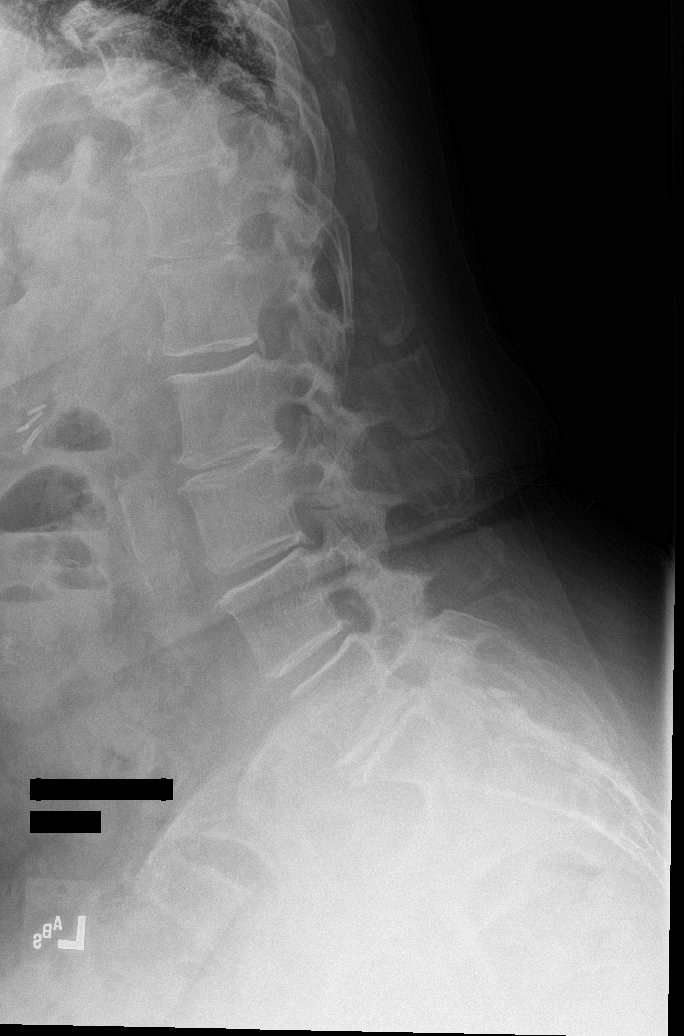

[l-spine ext]
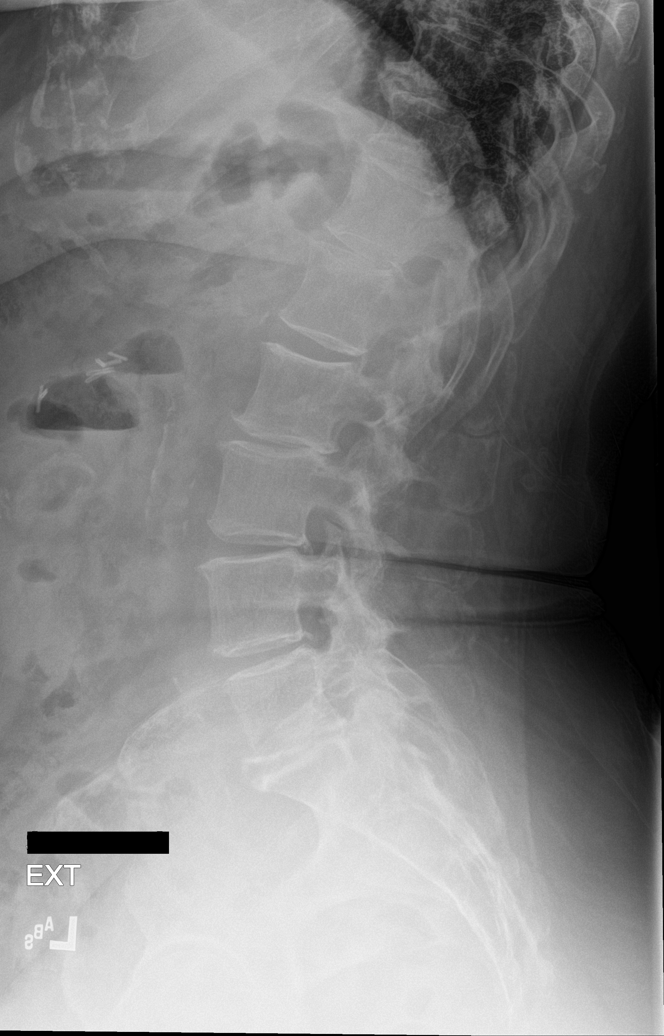

[l-spine spot]
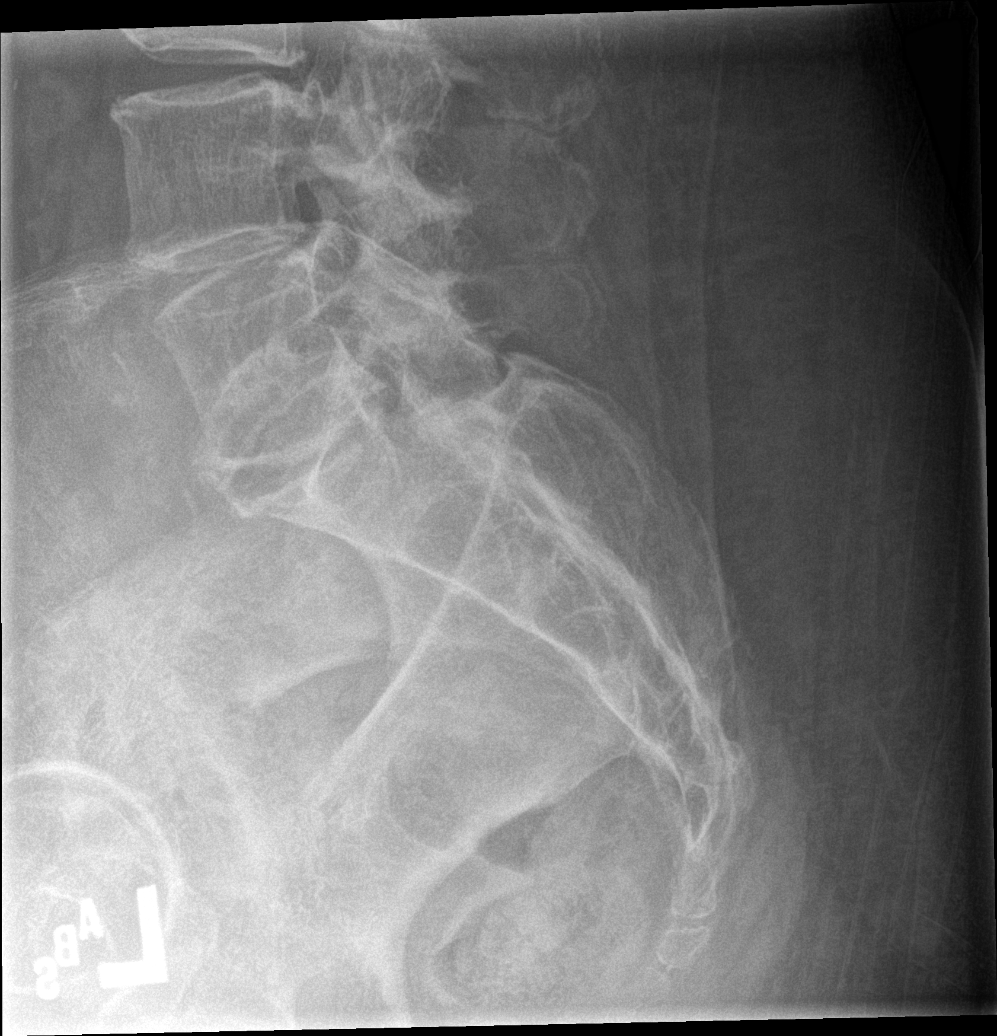

[7 of 7 positions shown; findings below may reference images not displayed]

FINDINGS: This report assumes 5 non rib-bearing lumbar vertebrae.

Lumbar vertebral body heights are preserved, with no fracture.

There is moderate to severe degenerative disc disease at L2-3 and
L5-S1. There is moderate degenerative disc disease at L3-4. There is
mild spondylosis at the remaining lumbar disc levels. There is 3 mm
retrolisthesis at L2-3 on the neutral view, which also measures 3 mm
on the flexion and extension views. There is 3 mm retrolisthesis at
L3-4 on the neutral view, which measures 2 mm on the flexion view
and 4 mm on the extension view. There is 3 mm anterolisthesis at
L4-5 on the neutral view, which also measures 3 mm on the flexion
and extension views. Moderate facet arthropathy seen bilaterally in
the lower lumbar spine. No aggressive appearing focal osseous
lesions. Aortic atherosclerosis. Surgical clips in the right upper
quadrant of the abdomen.
IMPRESSION: 1. Moderate multilevel degenerative disc disease in the lumbar
spine, most prominent at L2-3 and L5-S1.
2. Mild multilevel spondylolisthesis in the lumbar spine as
described, noting mild dynamic instability at L3-4 on the flexion
and extension views.
3. Moderate facet arthropathy bilaterally in the lower lumbar spine.
4. Aortic atherosclerosis.

## 2018-05-22 ENCOUNTER — Other Ambulatory Visit: Payer: Self-pay | Admitting: Family Medicine

## 2018-05-22 DIAGNOSIS — Z1231 Encounter for screening mammogram for malignant neoplasm of breast: Secondary | ICD-10-CM

## 2018-06-03 ENCOUNTER — Ambulatory Visit: Payer: Medicare Other

## 2018-06-10 ENCOUNTER — Ambulatory Visit: Payer: Medicare Other

## 2018-06-17 ENCOUNTER — Ambulatory Visit: Payer: Medicare Other

## 2018-06-17 DIAGNOSIS — M25669 Stiffness of unspecified knee, not elsewhere classified: Secondary | ICD-10-CM | POA: Insufficient documentation

## 2018-06-17 DIAGNOSIS — R269 Unspecified abnormalities of gait and mobility: Secondary | ICD-10-CM | POA: Insufficient documentation

## 2018-06-17 DIAGNOSIS — M7540 Impingement syndrome of unspecified shoulder: Secondary | ICD-10-CM | POA: Insufficient documentation

## 2018-06-17 DIAGNOSIS — M5126 Other intervertebral disc displacement, lumbar region: Secondary | ICD-10-CM | POA: Insufficient documentation

## 2018-06-23 ENCOUNTER — Other Ambulatory Visit: Payer: Self-pay | Admitting: Physician Assistant

## 2018-06-23 DIAGNOSIS — H903 Sensorineural hearing loss, bilateral: Secondary | ICD-10-CM

## 2018-06-29 ENCOUNTER — Ambulatory Visit
Admission: RE | Admit: 2018-06-29 | Discharge: 2018-06-29 | Disposition: A | Discharge status: Home / Self Care | Payer: Medicare Other | Source: Ambulatory Visit | Attending: Physician Assistant | Admitting: Physician Assistant

## 2018-06-29 ENCOUNTER — Other Ambulatory Visit: Payer: Self-pay

## 2018-06-29 DIAGNOSIS — H903 Sensorineural hearing loss, bilateral: Secondary | ICD-10-CM | POA: Insufficient documentation

## 2018-06-29 LAB — POCT I-STAT CREATININE: Creatinine, Ser: 0.7 mg/dL (ref 0.44–1.00)

## 2018-06-29 MED ORDER — GADOBUTROL 1 MMOL/ML IV SOLN
7.5000 mL | Freq: Once | INTRAVENOUS | Status: AC | PRN
  Administered 2018-06-29: 7.5 mL via INTRAVENOUS

## 2018-07-01 ENCOUNTER — Ambulatory Visit: Payer: Medicare Other

## 2018-07-27 ENCOUNTER — Ambulatory Visit: Payer: Self-pay | Admitting: Urology

## 2018-10-01 ENCOUNTER — Encounter: Payer: Self-pay | Admitting: Urology

## 2018-10-01 ENCOUNTER — Ambulatory Visit: Payer: Self-pay | Admitting: Urology

## 2018-10-13 ENCOUNTER — Ambulatory Visit: Payer: Medicare Other

## 2018-10-13 ENCOUNTER — Ambulatory Visit: Payer: Medicare Other | Admitting: Urology

## 2018-11-02 ENCOUNTER — Other Ambulatory Visit: Payer: Self-pay

## 2018-11-02 ENCOUNTER — Encounter (INDEPENDENT_AMBULATORY_CARE_PROVIDER_SITE_OTHER): Payer: Self-pay

## 2018-11-02 ENCOUNTER — Ambulatory Visit
Admission: RE | Admit: 2018-11-02 | Discharge: 2018-11-02 | Disposition: A | Discharge status: Home / Self Care | Payer: Medicare Other | Source: Ambulatory Visit | Attending: Family Medicine | Admitting: Family Medicine

## 2018-11-02 DIAGNOSIS — Z1231 Encounter for screening mammogram for malignant neoplasm of breast: Secondary | ICD-10-CM

## 2018-11-12 ENCOUNTER — Ambulatory Visit
Admission: RE | Admit: 2018-11-12 | Discharge: 2018-11-12 | Disposition: A | Discharge status: Home / Self Care | Payer: Medicare Other | Source: Ambulatory Visit | Attending: Urology | Admitting: Urology

## 2018-11-12 ENCOUNTER — Other Ambulatory Visit: Payer: Self-pay

## 2018-11-12 ENCOUNTER — Encounter: Payer: Self-pay | Admitting: Urology

## 2018-11-12 ENCOUNTER — Ambulatory Visit (INDEPENDENT_AMBULATORY_CARE_PROVIDER_SITE_OTHER): Payer: Medicare Other | Admitting: Urology

## 2018-11-12 VITALS — BP 118/73 | HR 85 | Ht 60.25 in | Wt 200.0 lb

## 2018-11-12 DIAGNOSIS — N390 Urinary tract infection, site not specified: Secondary | ICD-10-CM

## 2018-11-12 DIAGNOSIS — R300 Dysuria: Secondary | ICD-10-CM | POA: Diagnosis not present

## 2018-11-12 DIAGNOSIS — N3281 Overactive bladder: Secondary | ICD-10-CM | POA: Diagnosis not present

## 2018-11-12 LAB — MICROSCOPIC EXAMINATION: RBC: NONE SEEN /hpf (ref 0–2)

## 2018-11-12 LAB — BLADDER SCAN AMB NON-IMAGING

## 2018-11-12 LAB — URINALYSIS, COMPLETE
Bilirubin, UA: NEGATIVE
Glucose, UA: NEGATIVE
Ketones, UA: NEGATIVE
Leukocytes,UA: NEGATIVE
Nitrite, UA: NEGATIVE
Protein,UA: NEGATIVE
RBC, UA: NEGATIVE
Specific Gravity, UA: 1.025 (ref 1.005–1.030)
Urobilinogen, Ur: 1 mg/dL (ref 0.2–1.0)
pH, UA: 5 (ref 5.0–7.5)

## 2018-11-12 MED ORDER — NITROFURANTOIN MONOHYD MACRO 100 MG PO CAPS: 100.0000 mg | ORAL_CAPSULE | Freq: Every day | ORAL | 6 refills | Status: DC

## 2018-11-12 MED ORDER — OXYBUTYNIN CHLORIDE ER 10 MG PO TB24: 10.0000 mg | ORAL_TABLET | Freq: Every day | ORAL | 11 refills | Status: DC

## 2018-11-12 NOTE — Progress Notes (Signed)
11/12/18 5:11 PM   Merrilee Seashore Jan 05, 1941 [id number]  Referring provider: [place], [address] [place],  [place] [address]  CC: Recurrent UTIs  HPI: I saw Ms. Eulas Post in urology clinic in consultation for recurrent UTIs from Dr. Kym Groom.  She is a 78 year old relatively healthy female that has had around 10 UTIs per year over the last 3 years, including sepsis from urinary source approximately 4 years ago that required prolonged hospitalization.  Her cultures are all in care everywhere through the [facility] system, and are notable for numerous positive cultures, including 3 in the last 4 months including E. coli, Pseudomonas, and Klebsiella.  These had multiple drug resistances.  Her past medical history is notable for history of diverticulitis as well as vaginal hysterectomy with "bladder tack" approximately 10 years ago.  She denies any mesh was placed at this time.  When she has a UTI her symptoms are urinary frequency, dysuria, pelvic pain, and chills, in addition to foul-smelling urine.  At baseline she has urgency and urge incontinence even when not infected.  She denies any flank pain or gross hematuria.  She does consume coffee and 3-4 sodas during the day.  She denies any pneumaturia.  Urinalysis today is benign with 0-5 WBCs, 0 RBCs, 0-10 epithelial cells, few bacteria, no yeast, nitrite negative.  PVR is 3 mL.   PMH: Past Medical History:  Diagnosis Date  . Anxiety   . Arthritis   . Depression   . Fibromyalgia   . GERD (gastroesophageal reflux disease)   . History of carpal tunnel syndrome   . History of diverticulitis   . History of dysphagia   . History of vertigo   . Hypercholesteremia   . Hypertension   . Hypothyroidism   . Migraine   . Neuralgia 2014  . Obesity   . Polymyalgia rheumatica ([place])     Surgical History: Past Surgical History:  Procedure Laterality Date  . ABDOMINAL HYSTERECTOMY    . BREAST CYST ASPIRATION Right +/- 10 yrs ago    x 2  . CHOLECYSTECTOMY    . COLON SURGERY    . COLONOSCOPY  09/08/2006  . COLONOSCOPY WITH PROPOFOL N/A 07/16/2017   Procedure: COLONOSCOPY WITH PROPOFOL;  Surgeon: Manya Silvas, MD;  Location: [facility] ENDOSCOPY;  Service: Endoscopy;  Laterality: N/A;  . ESOPHAGOGASTRODUODENOSCOPY  01/24/2009  . HERNIA REPAIR    . JOINT REPLACEMENT Right    knee 2015  . THYROIDECTOMY      Allergies:  Allergies  Allergen Reactions  . Lac Bovis Other (See Comments)    Lactose Intolerance, headache LactoseIntolerance Lactose Intolerance, headache   . Milk-Related Compounds   . Peanut Oil Other (See Comments)    Headache Headache   . Peanut-Containing Drug Products   . Demerol [Meperidine] Palpitations and Other (See Comments)    RAYS COMES OUT OF EYES    Family History: Family History  Problem Relation Age of Onset  . Arthritis Mother   . Arthritis Father   . COPD Father   . Arthritis Sister   . Diabetes Sister   . Cancer Brother   . COPD Brother   . Cancer Son   . Drug abuse Son   . Stroke Maternal Grandmother   . Arthritis Paternal Grandfather   . Breast cancer Neg Hx     Social History:  reports that she quit smoking about 36 years ago. She has never used smokeless tobacco. She reports that she does not  drink alcohol or use drugs.  ROS: Please see flowsheet from today's date for complete review of systems.  Physical Exam: BP 118/73   Pulse 85   Ht 5' 0.25" (1.53 m)   Wt 200 lb (90.7 kg)   BMI 38.74 kg/m    Constitutional:  Alert and oriented, No acute distress. Cardiovascular: No clubbing, cyanosis, or edema. Respiratory: Normal respiratory effort, no increased work of breathing. GI: Abdomen is soft, nontender, nondistended, no abdominal masses GU: Pelvic exam performed with chaperone Katie.  Normal-appearing urethral meatus, no urethral abnormalities or lesions, healthy appearing mucosa. Lymph: No cervical or inguinal lymphadenopathy. Skin: No rashes, bruises or  suspicious lesions. Neurologic: Grossly intact, no focal deficits, moving all 4 extremities. Psychiatric: Normal mood and affect.  Laboratory Data: Reviewed see HPI in care everywhere for details  Pertinent Imaging: CT A/P 05/2016 with no hydronephrosis or urolithiasis  Assessment & Plan:   In summary, she is a 78 year old healthy female with overactive bladder symptoms, as well as recurrent UTIs with 3 in the last 4 months.  We discussed possible etiologies of UTIs, including more complex causes including staghorn kidney stones, colovesical fistulas, and residual foreign body from prior procedures  We discussed the evaluation and treatment of patients with recurrent UTIs at length.  We specifically discussed the differences between asymptomatic bacteriuria and true urinary tract infection.  We discussed the AUA definition of recurrent UTI of at least 2 culture proven symptomatic acute cystitis episodes in a 4041-month period, or 3 within a 1 year period.  We discussed the importance of culture directed antibiotic treatment, and antibiotic stewardship.  First-line therapy includes nitrofurantoin(5 days), Bactrim(3 days), or fosfomycin(3 g single dose).  Possible etiologies of recurrent infection include periurethral tissue atrophy in postmenopausal woman, constipation, sexual activity, incomplete emptying, anatomic abnormalities, and even genetic predisposition.  Finally, we discussed the role of perineal hygiene, timed voiding, adequate hydration, topical vaginal estrogen, cranberry prophylaxis, and low-dose antibiotic prophylaxis.  We discussed that overactive bladder (OAB) is not a disease, but is a symptom complex that is generally not life-threatening.  Symptoms typically include urinary urgency, frequency, and urge incontinence.  There are numerous treatment options, however there are risks and benefits with both medical and surgical management.  First-line treatment is behavioral therapies  including bladder training, pelvic floor muscle training, and fluid management.  Second line treatments include oral antimuscarinics(Ditropan er, Trospium) and beta-3 agonist (Mybetriq). There is typically a period of medication trial (4-8 weeks) to find the optimal therapy and dosing. If symptoms are bothersome despite the above management, third line options include intra-detrusor botox, peripheral tibial nerve stimulation (PTNS), and interstim (SNS). These are more invasive treatments with higher side effect profile, but may improve quality of life for patients with severe OAB symptoms.   -Trial of oxybutynin 10mg  XL for OAB symptoms. Behavioral strategies discussed, minimize soda -Start cranberry tablets BID, and nitrofurantoin daily for UTI ppx -KUB today to evaluate for nephrolithiasis, call with results -RTC 2 months for symptom check, consider cystoscopy if ongoing UTIs, consider addition of topical estrogen cream  A total of 60 minutes were spent face-to-face with the patient, greater than 50% was spent in patient education, counseling, and coordination of care regarding recurrent UTIs and overactive bladder.   Sondra ComeBrian C Theadore Blunck, MD  [facility] [address], [address] [place], [place] [address] [phone]

## 2018-11-12 NOTE — Patient Instructions (Addendum)
1. Start cranberry tablets twice daily to prevent UTI 2. Start oxybutynin for overactive bladder symptoms and leakage 3. Start nitrofurantoin daily for UTI prevention  Follow up in 2 months   Urinary Tract Infection, Adult A urinary tract infection (UTI) is an infection of any part of the urinary tract. The urinary tract includes:  The kidneys.  The ureters.  The bladder.  The urethra. These organs make, store, and get rid of pee (urine) in the body. What are the causes? This is caused by germs (bacteria) in your genital area. These germs grow and cause swelling (inflammation) of your urinary tract. What increases the risk? You are more likely to develop this condition if:  You have a small, thin tube (catheter) to drain pee.  You cannot control when you pee or poop (incontinence).  You are female, and: ? You use these methods to prevent pregnancy: ? A medicine that kills sperm (spermicide). ? A device that blocks sperm (diaphragm). ? You have low levels of a female hormone (estrogen). ? You are pregnant.  You have genes that add to your risk.  You are sexually active.  You take antibiotic medicines.  You have trouble peeing because of: ? A prostate that is bigger than normal, if you are female. ? A blockage in the part of your body that drains pee from the bladder (urethra). ? A kidney stone. ? A nerve condition that affects your bladder (neurogenic bladder). ? Not getting enough to drink. ? Not peeing often enough.  You have other conditions, such as: ? Diabetes. ? A weak disease-fighting system (immune system). ? Sickle cell disease. ? Gout. ? Injury of the spine. What are the signs or symptoms? Symptoms of this condition include:  Needing to pee right away (urgently).  Peeing often.  Peeing small amounts often.  Pain or burning when peeing.  Blood in the pee.  Pee that smells bad or not like normal.  Trouble peeing.  Pee that is  cloudy.  Fluid coming from the vagina, if you are female.  Pain in the belly or lower back. Other symptoms include:  Throwing up (vomiting).  No urge to eat.  Feeling mixed up (confused).  Being tired and grouchy (irritable).  A fever.  Watery poop (diarrhea). How is this treated? This condition may be treated with:  Antibiotic medicine.  Other medicines.  Drinking enough water. Follow these instructions at home:  Medicines  Take over-the-counter and prescription medicines only as told by your doctor.  If you were prescribed an antibiotic medicine, take it as told by your doctor. Do not stop taking it even if you start to feel better. General instructions  Make sure you: ? Pee until your bladder is empty. ? Do not hold pee for a long time. ? Empty your bladder after sex. ? Wipe from front to back after pooping if you are a female. Use each tissue one time when you wipe.  Drink enough fluid to keep your pee pale yellow.  Keep all follow-up visits as told by your doctor. This is important. Contact a doctor if:  You do not get better after 1-2 days.  Your symptoms go away and then come back. Get help right away if:  You have very bad back pain.  You have very bad pain in your lower belly.  You have a fever.  You are sick to your stomach (nauseous).  You are throwing up. Summary  A urinary tract infection (UTI) is an infection  of any part of the urinary tract.  This condition is caused by germs in your genital area.  There are many risk factors for a UTI. These include having a small, thin tube to drain pee and not being able to control when you pee or poop.  Treatment includes antibiotic medicines for germs.  Drink enough fluid to keep your pee pale yellow. This information is not intended to replace advice given to you by your health care provider. Make sure you discuss any questions you have with your health care provider. Document Released:  09/18/2007 Document Revised: 03/19/2018 Document Reviewed: 10/09/2017 Elsevier Patient Education  [place].   Overactive Bladder, Adult  Overactive bladder refers to a condition in which a person has a sudden need to pass urine. The person may leak urine if he or she cannot get to the bathroom fast enough (urinary incontinence). A person with this condition may also wake up several times in the night to go to the bathroom. Overactive bladder is associated with poor nerve signals between your bladder and your brain. Your bladder may get the signal to empty before it is full. You may also have very sensitive muscles that make your bladder squeeze too soon. These symptoms might interfere with daily work or social activities. What are the causes? This condition may be associated with or caused by:  Urinary tract infection.  Infection of nearby tissues, such as the prostate.  Prostate enlargement.  Surgery on the uterus or urethra.  Bladder stones, inflammation, or tumors.  Drinking too much caffeine or alcohol.  Certain medicines, especially medicines that get rid of extra fluid in the body (diuretics).  Muscle or nerve weakness, especially from: ? A spinal cord injury. ? Stroke. ? Multiple sclerosis. ? Parkinson's disease.  Diabetes.  Constipation. What increases the risk? You may be at greater risk for overactive bladder if you:  Are an older adult.  Smoke.  Are going through menopause.  Have prostate problems.  Have a neurological disease, such as stroke, dementia, Parkinson's disease, or multiple sclerosis (MS).  Eat or drink things that irritate the bladder. These include alcohol, spicy food, and caffeine.  Are overweight or obese. What are the signs or symptoms? Symptoms of this condition include:  Sudden, strong urge to urinate.  Leaking urine.  Urinating 8 or more times a day.  Waking up to urinate 2 or more times a night. How is this  diagnosed? Your health care provider may suspect overactive bladder based on your symptoms. He or she will diagnose this condition by:  A physical exam and medical history.  Blood or urine tests. You might need bladder or urine tests to help determine what is causing your overactive bladder. You might also need to see a health care provider who specializes in urinary tract problems (urologist). How is this treated? Treatment for overactive bladder depends on the cause of your condition and whether it is mild or severe. You can also make lifestyle changes at home. Options include:  Bladder training. This may include: ? Learning to control the urge to urinate by following a schedule that directs you to urinate at regular intervals (timed voiding). ? Doing Kegel exercises to strengthen your pelvic floor muscles, which support your bladder. Toning these muscles can help you control urination, even if your bladder muscles are overactive.  Special devices. This may include: ? Biofeedback, which uses sensors to help you become aware of your body's signals. ? Electrical stimulation, which uses electrodes  placed inside the body (implanted) or outside the body. These electrodes send gentle pulses of electricity to strengthen the nerves or muscles that control the bladder. ? Women may use a plastic device that fits into the vagina and supports the bladder (pessary).  Medicines. ? Antibiotics to treat bladder infection. ? Antispasmodics to stop the bladder from releasing urine at the wrong time. ? Tricyclic antidepressants to relax bladder muscles. ? Injections of botulinum toxin type A directly into the bladder tissue to relax bladder muscles.  Lifestyle changes. This may include: ? Weight loss. Talk to your health care provider about weight loss methods that would work best for you. ? Diet changes. This may include reducing how much alcohol and caffeine you consume, or drinking fluids at different  times of the day. ? Not smoking. Do not use any products that contain nicotine or tobacco, such as cigarettes and e-cigarettes. If you need help quitting, ask your health care provider.  Surgery. ? A device may be implanted to help manage the nerve signals that control urination. ? An electrode may be implanted to stimulate electrical signals in the bladder. ? A procedure may be done to change the shape of the bladder. This is done only in very severe cases. Follow these instructions at home: Lifestyle  Make any diet or lifestyle changes that are recommended by your health care provider. These may include: ? Drinking less fluid or drinking fluids at different times of the day. ? Cutting down on caffeine or alcohol. ? Doing Kegel exercises. ? Losing weight if needed. ? Eating a healthy and balanced diet to prevent constipation. This may include:  Eating foods that are high in fiber, such as fresh fruits and vegetables, whole grains, and beans.  Limiting foods that are high in fat and processed sugars, such as fried and sweet foods. General instructions  Take over-the-counter and prescription medicines only as told by your health care provider.  If you were prescribed an antibiotic medicine, take it as told by your health care provider. Do not stop taking the antibiotic even if you start to feel better.  Use any implants or pessary as told by your health care provider.  If needed, wear pads to absorb urine leakage.  Keep a journal or log to track how much and when you drink and when you feel the need to urinate. This will help your health care provider monitor your condition.  Keep all follow-up visits as told by your health care provider. This is important. Contact a health care provider if:  You have a fever.  Your symptoms do not get better with treatment.  Your pain and discomfort get worse.  You have more frequent urges to urinate. Get help right away if:  You are not  able to control your bladder. Summary  Overactive bladder refers to a condition in which a person has a sudden need to pass urine.  Several conditions may lead to an overactive bladder.  Treatment for overactive bladder depends on the cause and severity of your condition.  Follow your health care provider's instructions about lifestyle changes, doing Kegel exercises, keeping a journal, and taking medicines. This information is not intended to replace advice given to you by your health care provider. Make sure you discuss any questions you have with your health care provider. Document Released: 01/26/2009 Document Revised: 07/23/2018 Document Reviewed: 04/17/2017 Elsevier Patient Education  2020 [employer]

## 2018-11-13 ENCOUNTER — Other Ambulatory Visit: Payer: Self-pay | Admitting: Urology

## 2018-11-13 ENCOUNTER — Telehealth: Payer: Self-pay

## 2018-11-13 MED ORDER — SENNOSIDES-DOCUSATE SODIUM 8.6-50 MG PO TABS: 1.0000 | ORAL_TABLET | Freq: Two times a day (BID) | ORAL | 0 refills | Status: DC

## 2018-11-13 NOTE — Telephone Encounter (Signed)
-----   Message from Billey Co, MD sent at 11/13/2018  2:13 PM EDT ----- No kidney stones on KUB but she looks extremely constipated which can definitely contribute to the recurrent UTIs. I sent in a prescription for senna-docusate to her pharmacy, she should also start miralax daily and would also recommend a suppository. Keep follow up as scheduled  Thanks, Nickolas Madrid, MD 11/13/2018

## 2018-11-13 NOTE — Progress Notes (Signed)
sen

## 2018-11-13 NOTE — Telephone Encounter (Signed)
Left pt mess to call, no DPR on file 

## 2018-11-16 NOTE — Telephone Encounter (Signed)
Called pt no answer. No DPR therefore unable to leave message. 2nd attempt.

## 2018-11-17 NOTE — Telephone Encounter (Signed)
Returned call to patient. She never picked up medications for constipation. Since starting Oxybutynin and Macrobid, patient now has diarrhea. Advised patient to begin taking probiotic and eat yogurt for relief. If symptoms dont relieve, to call office. Patient verbalized understanding.

## 2018-11-17 NOTE — Telephone Encounter (Signed)
Patient is now calling stating that what Diamantina Providence gave her has given her diarrhea? What should she do?   Sharyn Lull

## 2019-01-14 ENCOUNTER — Ambulatory Visit: Payer: Medicare Other | Admitting: Urology

## 2019-02-01 ENCOUNTER — Ambulatory Visit: Payer: Medicare Other | Admitting: Urology

## 2019-02-22 ENCOUNTER — Ambulatory Visit (INDEPENDENT_AMBULATORY_CARE_PROVIDER_SITE_OTHER): Payer: Medicare Other | Admitting: Urology

## 2019-02-22 ENCOUNTER — Other Ambulatory Visit: Payer: Self-pay

## 2019-02-22 ENCOUNTER — Encounter: Payer: Self-pay | Admitting: Urology

## 2019-02-22 VITALS — BP 134/86 | HR 71 | Ht 60.0 in | Wt 200.0 lb

## 2019-02-22 DIAGNOSIS — N3281 Overactive bladder: Secondary | ICD-10-CM | POA: Diagnosis not present

## 2019-02-22 DIAGNOSIS — N952 Postmenopausal atrophic vaginitis: Secondary | ICD-10-CM

## 2019-02-22 DIAGNOSIS — N390 Urinary tract infection, site not specified: Secondary | ICD-10-CM | POA: Diagnosis not present

## 2019-02-22 DIAGNOSIS — R300 Dysuria: Secondary | ICD-10-CM | POA: Diagnosis not present

## 2019-02-22 LAB — URINALYSIS, COMPLETE
Bilirubin, UA: NEGATIVE
Glucose, UA: NEGATIVE
Ketones, UA: NEGATIVE
Nitrite, UA: POSITIVE — AB
Protein,UA: NEGATIVE
Specific Gravity, UA: 1.025 (ref 1.005–1.030)
Urobilinogen, Ur: 1 mg/dL (ref 0.2–1.0)
pH, UA: 5.5 (ref 5.0–7.5)

## 2019-02-22 LAB — MICROSCOPIC EXAMINATION

## 2019-02-22 LAB — BLADDER SCAN AMB NON-IMAGING: Scan Result: 29

## 2019-02-22 MED ORDER — ESTRADIOL 0.1 MG/GM VA CREA: TOPICAL_CREAM | VAGINAL | 12 refills | Status: DC

## 2019-02-22 NOTE — Progress Notes (Signed)
02/22/2019 9:20 AM   Alice [name] [birth year] [id number]  Referring provider: Dione Housekeeper, MD [address] [place],  [place] [address]  Chief Complaint  Patient presents with  . Follow-up    HPI: Alice [name] is a 78 year old female with recurrent UTI, OAB and constipation who presents today for follow-up.  She was first seen by Dr. Richardo Hanks on November 12, 2018 and was placed on oxybutynin 10 mg XL, advised her to minimize soda intake, start cranberry tablets twice daily and nitrofurantoin daily for UTI prophylaxis.  KUB did not note stones, but was positive for constipation.    Today, she is complaining of frequency, urgency, dysuria, nocturia, incontinence and straining to urinate.  She is also experiencing diarrhea.  She is having chills. Patient denies any gross hematuria, dysuria or suprapubic/flank pain.  Patient denies any fevers, nausea or vomiting. UA is positive for trace blood, nitrates and +1 leukocytes.  Microscopically it is positive for 11-30 WBCs and many bacteria.  Her PVR is 29 mL.  She has been without the Macrobid prophylaxis antibiotic due to financial constraints, but she has restarted it recently.  She is experiencing dysuria and she attributes it to the missed capsules of the antibiotic.  She also has been having difficulty getting the cranberry tablets due to financial constraints.    She did fill that the oxybutynin XL 10 mg help with her urinary frequency, but as the Macrobid prophylaxis she is not able to take it consistently.  PMH: Past Medical History:  Diagnosis Date  . Anxiety   . Arthritis   . Depression   . Fibromyalgia   . GERD (gastroesophageal reflux disease)   . History of carpal tunnel syndrome   . History of diverticulitis   . History of dysphagia   . History of vertigo   . Hypercholesteremia   . Hypertension   . Hypothyroidism   . Migraine   . Neuralgia 2014  . Obesity   . Polymyalgia rheumatica (HCC)     Surgical  History: Past Surgical History:  Procedure Laterality Date  . ABDOMINAL HYSTERECTOMY    . BREAST CYST ASPIRATION Right +/- 10 yrs ago   x 2  . CHOLECYSTECTOMY    . COLON SURGERY    . COLONOSCOPY  09/08/2006  . COLONOSCOPY WITH PROPOFOL N/A 07/16/2017   Procedure: COLONOSCOPY WITH PROPOFOL;  Surgeon: Scot Jun, MD;  Location: [facility] ENDOSCOPY;  Service: Endoscopy;  Laterality: N/A;  . ESOPHAGOGASTRODUODENOSCOPY  01/24/2009  . HERNIA REPAIR    . JOINT REPLACEMENT Right    knee 2015  . THYROIDECTOMY      Home Medications:  Allergies as of 02/22/2019      Reactions   Lac Bovis Other (See Comments)   Lactose Intolerance, headache LactoseIntolerance Lactose Intolerance, headache   Milk-related Compounds    Peanut Oil Other (See Comments)   Headache Headache   Peanut-containing Drug Products    Demerol [meperidine] Palpitations, Other (See Comments)   RAYS COMES OUT OF EYES      Medication List       Accurate as of February 22, 2019  9:20 AM. If you have any questions, ask your nurse or doctor.        cephALEXin 500 MG capsule Commonly known as: KEFLEX Take 1 capsule (500 mg total) by mouth 2 (two) times daily.   [employer] D3 125 MCG (5000 UT) capsule Generic drug: Cholecalciferol TAKE 5,000 UNITS BY MOUTH ONCE DAILY.  estradiol 0.1 MG/GM vaginal cream Commonly known as: ESTRACE VAGINAL Apply 0.5mg  (pea-sized amount)  just inside the vaginal introitus with a finger-tip on Monday, Wednesday and Friday nights. Started by: Michiel CowboySHANNON Donnalee Cellucci, PA-C   gabapentin 400 MG capsule Commonly known as: NEURONTIN Take 400 mg by mouth 3 (three) times daily.   LOSARTAN POTASSIUM PO Take 25 mg by mouth daily.   meclizine 25 MG tablet Commonly known as: ANTIVERT Take 1 tablet (25 mg total) by mouth 3 (three) times daily as needed.   nitrofurantoin (macrocrystal-monohydrate) 100 MG capsule Commonly known as: MACROBID Take 1 capsule (100 mg total) by mouth daily.   oxybutynin 10  MG 24 hr tablet Commonly known as: DITROPAN-XL Take 1 tablet (10 mg total) by mouth daily.   PARoxetine 40 MG tablet Commonly known as: PAXIL Take 40 mg by mouth daily.   phenazopyridine 200 MG tablet Commonly known as: PYRIDIUM Take 1 tablet (200 mg total) by mouth 3 (three) times daily.   ranitidine 150 MG capsule Commonly known as: ZANTAC Take 150 mg by mouth 2 (two) times daily.   senna-docusate 8.6-50 MG tablet Commonly known as: Senokot-S Take 1 tablet by mouth 2 (two) times daily.   simvastatin 20 MG tablet Commonly known as: ZOCOR Take by mouth 2 (two) times daily.       Allergies:  Allergies  Allergen Reactions  . Lac Bovis Other (See Comments)    Lactose Intolerance, headache LactoseIntolerance Lactose Intolerance, headache   . Milk-Related Compounds   . Peanut Oil Other (See Comments)    Headache Headache   . Peanut-Containing Drug Products   . Demerol [Meperidine] Palpitations and Other (See Comments)    RAYS COMES OUT OF EYES    Family History: Family History  Problem Relation Age of Onset  . Arthritis Mother   . Arthritis Father   . COPD Father   . Arthritis Sister   . Diabetes Sister   . Cancer Brother   . COPD Brother   . Cancer Son   . Drug abuse Son   . Stroke Maternal Grandmother   . Arthritis Paternal Grandfather   . Breast cancer Neg Hx     Social History:  reports that she quit smoking about 37 years ago. She has never used smokeless tobacco. She reports that she does not drink alcohol or use drugs.  ROS: UROLOGY Frequent Urination?: Yes Hard to postpone urination?: Yes Burning/pain with urination?: Yes Get up at night to urinate?: Yes Leakage of urine?: Yes Urine stream starts and stops?: No Trouble starting stream?: No Do you have to strain to urinate?: Yes Blood in urine?: No Urinary tract infection?: Yes Sexually transmitted disease?: No Injury to kidneys or bladder?: No Painful intercourse?: No Weak stream?: No  Currently pregnant?: No Vaginal bleeding?: No Last menstrual period?: n  Gastrointestinal Nausea?: No Vomiting?: No Indigestion/heartburn?: No Diarrhea?: Yes Constipation?: No  Constitutional Fever: No Night sweats?: No Weight loss?: No Fatigue?: Yes  Skin Skin rash/lesions?: No Itching?: No  Eyes Blurred vision?: No Double vision?: No  Ears/Nose/Throat Sore throat?: No Sinus problems?: Yes  Hematologic/Lymphatic Swollen glands?: No Easy bruising?: Yes  Cardiovascular Leg swelling?: Yes Chest pain?: No  Respiratory Cough?: No Shortness of breath?: No  Endocrine Excessive thirst?: No  Musculoskeletal Back pain?: Yes Joint pain?: Yes  Neurological Headaches?: No Dizziness?: No  Psychologic Depression?: Yes Anxiety?: Yes  Physical Exam: BP 134/86   Pulse 71   Ht 5' (1.524 m)   Wt 200 lb (90.7  kg)   BMI 39.06 kg/m   Constitutional:  Well nourished. Alert and oriented, No acute distress. HEENT: [place] AT, moist mucus membranes.  Trachea midline, no masses. Cardiovascular: No clubbing, cyanosis, or edema. Respiratory: Normal respiratory effort, no increased work of breathing. Skin: No rashes, bruises or suspicious lesions. Lymph: No cervical or inguinal adenopathy. Neurologic: Grossly intact, no focal deficits, moving all 4 extremities. Psychiatric: Normal mood and affect.  Laboratory Data: Lab Results  Component Value Date   WBC 8.2 04/27/2016   HGB 15.7 04/27/2016   HCT 44.3 04/27/2016   MCV 93.5 04/27/2016   PLT 196 04/27/2016    Lab Results  Component Value Date   CREATININE 0.70 06/29/2018    No results found for: PSA  No results found for: TESTOSTERONE  No results found for: HGBA1C  No results found for: TSH  No results found for: CHOL, HDL, CHOLHDL, VLDL, LDLCALC  Lab Results  Component Value Date   AST 29 04/27/2016   Lab Results  Component Value Date   ALT 17 04/27/2016   No components found for:  ALKALINEPHOPHATASE No components found for: BILIRUBINTOTAL  No results found for: ESTRADIOL  Urinalysis Component     Latest Ref Rng & Units 02/22/2019  Specific Gravity, UA     1.005 - 1.030 1.025  pH, UA     5.0 - 7.5 5.5  Color, UA     Yellow Yellow  Appearance Ur     Clear Cloudy (A)  Leukocytes,UA     Negative 1+ (A)  Protein,UA     Negative/Trace Negative  Glucose, UA     Negative Negative  Ketones, UA     Negative Negative  RBC, UA     Negative Trace (A)  Bilirubin, UA     Negative Negative  Urobilinogen, Ur     0.2 - 1.0 mg/dL 1.0  Nitrite, UA     Negative Positive (A)  Microscopic Examination      See below:   Component     Latest Ref Rng & Units 02/22/2019  WBC, UA     0 - 5 /hpf 11-30 (A)  RBC     0 - 2 /hpf 0-2  Epithelial Cells (non renal)     0 - 10 /hpf 0-10  Renal Epithel, UA     None seen /hpf 0-10 (A)  Bacteria, UA     None seen/Few Many (A)   I have reviewed the labs.   Pertinent Imaging: Results for CHANAY, NUGENT (MRN [id number]) as of 02/22/2019 09:15  Ref. Range 02/22/2019 08:51  Scan Result Unknown 29     Assessment & Plan:    1. rUTI's Encourage patient to drink 1.5 L of water daily Treatment of her recurrent UTIs is challenged by her social economic status as she is unable to afford to take the prophylactic antibiotic daily or other over-the-counter methods to help prevent infection - Bladder Scan (Post Void Residual) in office - Urinalysis, Complete  2.  Vaginal atrophy I explained to the patient that when women go through menopause and her estrogen levels are severely diminished, the normal vaginal flora will change.  This is due to an increase of the vaginal canal's pH. Because of this, the vaginal canal may be colonized by bacteria from the rectum instead of the protective lactobacillus.  This, accompanied by the loss of the mucus barrier with vaginal atrophy, is a cause of recurrent urinary tract infections. In some  studies, the use of  vaginal estrogen cream has been demonstrated to reduce  recurrent urinary tract infections to one a year.  Patient was given a sample of vaginal estrogen cream (Premarin vaginal cream) and instructed to apply 0.5mg  (pea-sized amount)  just inside the vaginal introitus with a finger-tip on Monday, Wednesday and Friday nights.  I explained to the patient that vaginally administered estrogen, which causes only a slight increase in the blood estrogen levels, have fewer contraindications and adverse systemic effects that oral HT. She will follow up in three months for an exam.    3. OAB Patient will continue the oxybutynin XL 10 mg daily She return to clinic in 3 months for OAB questionnaire and PVR   Return in about 3 months (around 05/25/2019) for OAB questionnaire, PVR and exam.  These notes generated with voice recognition software. I apologize for typographical errors.  Zara Council, PA-C  [facility] [address]  [place], [place] [address] [phone]

## 2019-02-22 NOTE — Patient Instructions (Signed)
  You are given a sample of vaginal estrogen cream Premarin and instructed to apply 0.5mg (pea-sized amount)  just inside the vaginal introitus with a finger-tip on Monday, Wednesday and Friday nights,     

## 2019-02-24 ENCOUNTER — Telehealth: Payer: Self-pay | Admitting: Family Medicine

## 2019-02-24 LAB — CULTURE, URINE COMPREHENSIVE

## 2019-02-24 NOTE — Telephone Encounter (Signed)
-----   Message from Nori Riis, PA-C sent at 02/24/2019  1:44 PM EST ----- Please let Mrs. Tarte know that her urine culture was positive for infection.  I would like her to start Septra DS, twice daily for 7 days.

## 2019-02-25 MED ORDER — SULFAMETHOXAZOLE-TRIMETHOPRIM 800-160 MG PO TABS: 1.0000 | ORAL_TABLET | Freq: Two times a day (BID) | ORAL | 0 refills | Status: DC

## 2019-02-25 NOTE — Telephone Encounter (Signed)
Patient notified and voiced understanding.

## 2019-05-25 ENCOUNTER — Ambulatory Visit: Payer: Medicare Other | Admitting: Urology

## 2019-06-20 ENCOUNTER — Emergency Department: Payer: Medicare HMO

## 2019-06-20 ENCOUNTER — Emergency Department
Admission: EM | Admit: 2019-06-20 | Discharge: 2019-06-20 | Disposition: A | Discharge status: Home / Self Care | Payer: Medicare HMO | Attending: Emergency Medicine | Admitting: Emergency Medicine

## 2019-06-20 ENCOUNTER — Other Ambulatory Visit: Payer: Self-pay

## 2019-06-20 DIAGNOSIS — R519 Headache, unspecified: Secondary | ICD-10-CM | POA: Insufficient documentation

## 2019-06-20 DIAGNOSIS — Z87891 Personal history of nicotine dependence: Secondary | ICD-10-CM | POA: Insufficient documentation

## 2019-06-20 DIAGNOSIS — Z79899 Other long term (current) drug therapy: Secondary | ICD-10-CM | POA: Insufficient documentation

## 2019-06-20 DIAGNOSIS — Z91010 Allergy to peanuts: Secondary | ICD-10-CM | POA: Diagnosis not present

## 2019-06-20 DIAGNOSIS — E039 Hypothyroidism, unspecified: Secondary | ICD-10-CM | POA: Insufficient documentation

## 2019-06-20 DIAGNOSIS — Z96651 Presence of right artificial knee joint: Secondary | ICD-10-CM | POA: Diagnosis not present

## 2019-06-20 DIAGNOSIS — R0989 Other specified symptoms and signs involving the circulatory and respiratory systems: Secondary | ICD-10-CM | POA: Diagnosis not present

## 2019-06-20 MED ORDER — CLONIDINE HCL 0.1 MG PO TABS
0.1000 mg | ORAL_TABLET | Freq: Once | ORAL | Status: AC
  Administered 2019-06-20: 0.1 mg via ORAL
  Filled 2019-06-20: qty 1

## 2019-06-20 MED ORDER — SUCRALFATE 1 G PO TABS: 1.0000 g | ORAL_TABLET | Freq: Four times a day (QID) | ORAL | 0 refills | Status: DC

## 2019-06-20 MED ORDER — LIDOCAINE VISCOUS HCL 2 % MT SOLN
15.0000 mL | Freq: Once | OROMUCOSAL | Status: AC
  Administered 2019-06-20: 15 mL via OROMUCOSAL
  Filled 2019-06-20: qty 15

## 2019-06-20 MED ORDER — FAMOTIDINE 20 MG PO TABS: 20.0000 mg | ORAL_TABLET | Freq: Every day | ORAL | 1 refills | Status: DC

## 2019-06-20 MED ORDER — ACETAMINOPHEN 500 MG PO TABS
1000.0000 mg | ORAL_TABLET | Freq: Once | ORAL | Status: AC
  Administered 2019-06-20: 17:00:00 1000 mg via ORAL
  Filled 2019-06-20: qty 2

## 2019-06-20 NOTE — Discharge Instructions (Signed)
As we discussed please talk to your doctor about obtaining a CT scan to further evaluate the nodule seen on chest x-ray today.

## 2019-06-20 NOTE — ED Triage Notes (Signed)
Patient arrived via [facility] from home, patient is AOx4 and ambulatory. Patient was eating crackers and choked on cracker, patient did spit cracker out, since incident, patient has headache, generalized chest pain, and patients arms and legs hurt.   Patient ambulated from stretcher to wheelchair, from wheelchair to ED stretcher.

## 2019-06-20 NOTE — ED Provider Notes (Signed)
[facility] Emergency Department Provider Note   ____________________________________________   I have reviewed the triage vital signs and the nursing notes.   HISTORY  Chief Complaint Choked on Cracker, Headache, and Hypertension   History limited by: Not Limited   HPI Alice [name] is a 79 y.o. female who presents to the emergency department today because of concern for pain after a choking episode. She says that she choked on a cracker roughly 2 hours ago. Since then she has had significant headache, chest pain and bilateral arm pain. She has also felt that her blood pressure has been elevated. Says that she has had similar episodes of pain and elevated blood pressure with choking in the past. She says she is on blood pressure medication but takes it at night.    Records reviewed. Per medical record review patient has a history of ER visit in the past after a choking episode in 2017. Was also complaining of chest pain with radiation to her arms at that time. Also found to be hypertensive at that time. Extensive work up including CT angio was negative at that time.   Past Medical History:  Diagnosis Date  . Anxiety   . Arthritis   . Depression   . Fibromyalgia   . GERD (gastroesophageal reflux disease)   . History of carpal tunnel syndrome   . History of diverticulitis   . History of dysphagia   . History of vertigo   . Hypercholesteremia   . Hypertension   . Hypothyroidism   . Migraine   . Neuralgia 2014  . Obesity   . Polymyalgia rheumatica [facility])     Patient Active Problem List   Diagnosis Date Noted  . Chronic pain syndrome 07/09/2016  . Musculoskeletal pain 07/09/2016  . Severe obesity (BMI 35.0-39.9) with comorbidity (HCC) 12/04/2015  . Chronic knee pain (S/P TKR) (Right) 12/04/2015  . Chronic knee pain (Left) 12/04/2015  . 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) (Right) 12/04/2015  . Osteoarthritis of knee (Left) 12/04/2015  .  Carpal tunnel syndrome 11/23/2015  . Depression 11/23/2015  . Fibromyalgia 11/23/2015  . GERD (gastroesophageal reflux disease) 11/23/2015  . History of dysphagia 11/23/2015  . Hypothyroidism 11/23/2015  . Hyperlipidemia 11/23/2015  . Hypertension 11/23/2015  . Migraine headache 11/23/2015  . Osteoarthritis (arthritis due to wear and tear of joints) 11/23/2015  . Chronic low back pain (Location of Tertiary source of pain) (Bilateral) (L>R) 11/23/2015  . Chronic lower extremity pain (Bilateral) (L>R) 11/23/2015  . Chronic knee pain (Location of Primary Source of Pain) (Bilateral) (L>R) 11/23/2015  . Chronic hip pain (Location of Secondary source of pain) (Bilateral) (L>R) 11/23/2015  . 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) 11/23/2015  . Lumbar facet syndrome (Bilateral) (L>R) 11/23/2015  . Chronic sacroiliac joint pain (Bilateral) (L>R) 11/23/2015  . Long term current use of opiate analgesic 11/23/2015  . Long term prescription opiate use 11/23/2015  . Opiate use 11/23/2015  . Long term current use of non-steroidal anti-inflammatories (NSAID) 11/23/2015  . Disturbance of skin sensation 11/23/2015  . Diverticulosis 11/23/2015  . Neurogenic pain 11/23/2015  . 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 Encounter for pain management planning 11/23/2015  . Encounter for therapeutic drug level monitoring 11/23/2015  . Chronic neck pain 11/23/2015  . Lumbar spondylosis 11/23/2015  . Cervical spondylosis 11/23/2015  . History of TKR (total knee replacement) (Right) 11/23/2015  . Anxiety 08/25/2013  . Neuralgia 01/25/2013  . No transfusions per religious  beliefs 02/27/2012    Past Surgical History:  Procedure Laterality Date  . ABDOMINAL HYSTERECTOMY    . BREAST CYST ASPIRATION Right +/- 10 yrs ago   x 2  . CHOLECYSTECTOMY    . COLON SURGERY    . COLONOSCOPY  09/08/2006  . COLONOSCOPY WITH PROPOFOL N/A 07/16/2017   Procedure: COLONOSCOPY WITH PROPOFOL;   Surgeon: Scot Jun, MD;  Location: [facility] ENDOSCOPY;  Service: Endoscopy;  Laterality: N/A;  . ESOPHAGOGASTRODUODENOSCOPY  01/24/2009  . HERNIA REPAIR    . JOINT REPLACEMENT Right    knee 2015  . THYROIDECTOMY      Prior to Admission medications   Medication Sig Start Date End Date Taking? Authorizing Provider  cephALEXin (KEFLEX) 500 MG capsule Take 1 capsule (500 mg total) by mouth 2 (two) times daily. 08/02/17   Lutricia Feil, PA-C  [facility] D3 5000 units capsule TAKE 5,000 UNITS BY MOUTH ONCE DAILY. 10/10/15   [provider]  estradiol (ESTRACE VAGINAL) 0.1 MG/GM vaginal cream Apply 0.5mg  (pea-sized amount)  just inside the vaginal introitus with a finger-tip on Monday, Wednesday and Friday nights. 02/22/19   Michiel Cowboy A, PA-C  gabapentin (NEURONTIN) 400 MG capsule Take 400 mg by mouth 3 (three) times daily.  09/23/15   [provider]  LOSARTAN POTASSIUM PO Take 25 mg by mouth daily.    [provider]  meclizine (ANTIVERT) 25 MG tablet Take 1 tablet (25 mg total) by mouth 3 (three) times daily as needed. 04/27/16   Jeanmarie Plant, MD  nitrofurantoin, macrocrystal-monohydrate, (MACROBID) 100 MG capsule Take 1 capsule (100 mg total) by mouth daily. 11/12/18   Sondra Come, MD  oxybutynin (DITROPAN-XL) 10 MG 24 hr tablet Take 1 tablet (10 mg total) by mouth daily. 11/12/18   Sondra Come, MD  PARoxetine (PAXIL) 40 MG tablet Take 40 mg by mouth daily. 09/11/15   [provider]  phenazopyridine (PYRIDIUM) 200 MG tablet Take 1 tablet (200 mg total) by mouth 3 (three) times daily. 08/02/17   Lutricia Feil, PA-C  ranitidine (ZANTAC) 150 MG capsule Take 150 mg by mouth 2 (two) times daily. 09/13/15   [provider]  senna-docusate (SENOKOT-S) 8.6-50 MG tablet Take 1 tablet by mouth 2 (two) times daily. 11/13/18   Sondra Come, MD  simvastatin (ZOCOR) 20 MG tablet Take by mouth 2 (two) times daily.  05/09/15   [provider]  sulfamethoxazole-trimethoprim (BACTRIM DS) 800-160 MG tablet Take 1 tablet by mouth every 12 (twelve) hours. 02/25/19   Michiel Cowboy A, PA-C    Allergies Lac bovis, Milk-related compounds, Peanut oil, Peanut-containing drug products, and Demerol [meperidine]  Family History  Problem Relation Age of Onset  . Arthritis Mother   . Arthritis Father   . COPD Father   . Arthritis Sister   . Diabetes Sister   . Cancer Brother   . COPD Brother   . Cancer Son   . Drug abuse Son   . Stroke Maternal Grandmother   . Arthritis Paternal Grandfather   . Breast cancer Neg Hx     Social History Social History   Tobacco Use  . Smoking status: Former Smoker    Quit date: 02/26/1982    Years since quitting: 37.3  . Smokeless tobacco: Never Used  Substance Use Topics  . Alcohol use: No  . Drug use: No    Review of Systems Constitutional: No fever/chills Eyes: No visual changes. ENT: No sore throat. Cardiovascular:  Positive for chest pain. Respiratory: Denies shortness of breath. Gastrointestinal: Positive for choking episode.   Genitourinary: Negative for dysuria. Musculoskeletal: Positive for bilateral arm pain. Skin: Negative for rash. Neurological: Positive for headache. ____________________________________________   PHYSICAL EXAM:  VITAL SIGNS: ED Triage Vitals  Enc Vitals Group     BP 06/20/19 1538 (S) (!) 220/113     Pulse Rate 06/20/19 1538 82     Resp 06/20/19 1538 (!) 22     Temp 06/20/19 1538 97.8 F (36.6 C)     Temp Source 06/20/19 1538 Oral     SpO2 06/20/19 1538 96 %     Weight 06/20/19 1549 199 lb 15.3 oz (90.7 kg)     Height 06/20/19 1549 5\' 1"  (1.549 m)     Head Circumference --      Peak Flow --      Pain Score 06/20/19 1549 6   Constitutional: Alert and oriented.  Eyes: Conjunctivae are normal.  ENT      Head: Normocephalic and atraumatic.      Nose: No congestion/rhinnorhea.      Mouth/Throat: Mucous membranes are moist.      Neck: No  stridor. Hematological/Lymphatic/Immunilogical: No cervical lymphadenopathy. Cardiovascular: Normal rate, regular rhythm.  No murmurs, rubs, or gallops.  Respiratory: Normal respiratory effort without tachypnea nor retractions. Breath sounds are clear and equal bilaterally. No wheezes/rales/rhonchi. Gastrointestinal: Soft and non tender. No rebound. No guarding.  Genitourinary: Deferred Musculoskeletal: Normal range of motion in all extremities. No lower extremity edema. Neurologic:  Normal speech and language. No gross focal neurologic deficits are appreciated.  Skin:  Skin is warm, dry and intact. No rash noted. Psychiatric: Mood and affect are normal. Speech and behavior are normal. Patient exhibits appropriate insight and judgment.  ____________________________________________    LABS (pertinent positives/negatives)  None  ____________________________________________   EKG  I, 08/20/19, attending physician, personally viewed and interpreted this EKG  EKG Time: 1538 Rate: 85 Rhythm: sinus rhythm Axis: normal Intervals: qtc 471 QRS: narrow ST changes: no st elevation Impression: normal ekg ____________________________________________    RADIOLOGY  CXR No evidence of acute disease. Small nodule in right lower lobe.  ____________________________________________   PROCEDURES  Procedures  ____________________________________________   INITIAL IMPRESSION / ASSESSMENT AND PLAN / ED COURSE  Pertinent labs & imaging results that were available during my care of the patient were reviewed by me and considered in my medical decision making (see chart for details).   Patient presented to the emergency department today because of concerns for headache, chest pain, arm pain and high blood pressure after an episode of choking on a cracker.  She does state that she has had similar episodes in the past with choking and had been seen in our ED for one such episode a  couple of years ago.  Patient had an extensive work-up done at that time without any concerning etiology found.  Given that the patient has had the symptoms in the past with choking episodes did not feel that it significant work-up necessarily had to be initiated here.  Did get a chest x-ray and EKG.  No widened mediastinum or mediastinal gas.  EKG without concerning findings.  Patient did get significant relief after GI cocktail.  She was able to sleep afterwards.  Additionally patient's blood pressure significantly improved with medication.  Given that the patient feels better and that this is happened to her multiple times in the past do not feel any further work-up is necessary at  this time.  Will plan on discharging. Discussed return precautions.   ____________________________________________   FINAL CLINICAL IMPRESSION(S) / ED DIAGNOSES  Final diagnoses:  Choking episode     Note: This dictation was prepared with Dragon dictation. Any transcriptional errors that result from this process are unintentional     Nance Pear, MD 06/20/19 1950

## 2019-07-05 ENCOUNTER — Other Ambulatory Visit: Payer: Self-pay | Admitting: Physician Assistant

## 2019-07-05 DIAGNOSIS — E079 Disorder of thyroid, unspecified: Secondary | ICD-10-CM

## 2019-07-06 ENCOUNTER — Encounter: Payer: Self-pay | Admitting: Urology

## 2019-07-06 ENCOUNTER — Ambulatory Visit: Payer: Medicare HMO | Admitting: Urology

## 2019-07-08 ENCOUNTER — Encounter: Payer: Self-pay | Admitting: Urology

## 2019-07-08 ENCOUNTER — Ambulatory Visit (INDEPENDENT_AMBULATORY_CARE_PROVIDER_SITE_OTHER): Payer: Medicare HMO | Admitting: Urology

## 2019-07-08 ENCOUNTER — Other Ambulatory Visit: Payer: Self-pay

## 2019-07-08 VITALS — BP 116/70 | HR 78 | Ht 60.0 in | Wt 194.0 lb

## 2019-07-08 DIAGNOSIS — N390 Urinary tract infection, site not specified: Secondary | ICD-10-CM | POA: Diagnosis not present

## 2019-07-08 DIAGNOSIS — R399 Unspecified symptoms and signs involving the genitourinary system: Secondary | ICD-10-CM

## 2019-07-08 DIAGNOSIS — M5416 Radiculopathy, lumbar region: Secondary | ICD-10-CM | POA: Insufficient documentation

## 2019-07-08 DIAGNOSIS — N3281 Overactive bladder: Secondary | ICD-10-CM | POA: Diagnosis not present

## 2019-07-08 LAB — URINALYSIS, COMPLETE
Bilirubin, UA: NEGATIVE
Glucose, UA: NEGATIVE
Ketones, UA: NEGATIVE
Nitrite, UA: POSITIVE — AB
Protein,UA: NEGATIVE
Specific Gravity, UA: 1.025 (ref 1.005–1.030)
Urobilinogen, Ur: 2 mg/dL — ABNORMAL HIGH (ref 0.2–1.0)
pH, UA: 5.5 (ref 5.0–7.5)

## 2019-07-08 LAB — MICROSCOPIC EXAMINATION

## 2019-07-08 LAB — BLADDER SCAN AMB NON-IMAGING

## 2019-07-08 NOTE — Patient Instructions (Signed)
It is important that you drink 6 to 8 glasses of water daily.  Half of the fluid intake should be water. Take most of your fluids before dinner. Don't drink too much liquid at once, sip instead of gulp.  There are certain beverages and foods that have known to the bladder irritants and they should be avoided or consumed infrequently. These include but not limited to beverages and foods containing caffeine, carbonated beverages, highly acidic or spicy foods, such as fruits, tomato products, artificial sweeteners and alcohol.  If you find it difficult to avoid these foods it may be helpful to alternate water in between consuming these foods.  It is important to stay well hydrated and eat 24 to 30 grams of fiber daily.    It has shown that even weight loss can help with urinary incontinence. In this study it demonstrated that and a percent weight loss and obese women (20 pound loss for a 250 pound woman) cut the number of incontinence episodes nearly in half. 

## 2019-07-08 NOTE — Progress Notes (Signed)
07/08/2019 3:00 PM   Dyana Magner [birth year] [id number]  Referring provider: Valera Castle, MD [address] [place],  [place] [address]  Chief Complaint  Patient presents with  . Follow-up    HPI: Mrs. Strain is a 79 year old female with recurrent UTI, OAB and constipation who presents today for three month follow up.    She was first seen by Dr. Diamantina Providence on November 12, 2018 and was placed on oxybutynin 10 mg XL, advised her to minimize soda intake, start cranberry tablets twice daily and nitrofurantoin daily for UTI prophylaxis.  KUB did not note stones, but was positive for constipation.    At her visit on 02/22/2019, she was complaining of frequency, urgency, dysuria, nocturia, incontinence and straining to urinate.  She was also experiencing diarrhea and was having chills.  UA was positive for trace blood, nitrates and +1 leukocytes.  Microscopically it is positive for 11-30 WBCs and many bacteria.  Urine culture was positive for E. Coli.  Her PVR was 29 mL.  She did fill that the oxybutynin XL 10 mg help with her urinary frequency, but for the Macrobid prophylaxis she is not able to take it consistently.  Vaginal estrogen cream was also added to her therapy plan.  Today, the patient is  experiencing urgency x 4-7, frequency x 4-7, not restricting fluids to avoid visits to the restroom, is engaging in toilet mapping, incontinence x 0-3 and nocturia x 0-3.   Her BP is 116/70.   Her PVR is 0 mL.    She had a visit with her PCP on June 22, 2019 and at that visit she was experiencing an increase of frequency, dysuria and urgency.  Urine dip was nitrite positive and urine culture returned positive for Klebsiella pneumoniae as it was resistant to nitrofurantoin.  She was given three days of Keflex.    She is still experiencing frequency and urgency along with lower back pain and lower abdominal pain.   She is having baseline symptoms of urge incontinence and intermittency.  She  states she goes through 2 depends daily regardless of whether or not she has incontinence.  She cannot be sure of the volume of incontinence she experiences.    Her UA today is yellow cloudy, specific gravity 1.025, trace blood, nitrite positive, 1+ leukocyte, 11-30 WBCs, 0-2 RBCs, 0-10 epithelial cells and many bacteria.  PMH: Past Medical History:  Diagnosis Date  . Anxiety   . Arthritis   . Depression   . Fibromyalgia   . GERD (gastroesophageal reflux disease)   . History of carpal tunnel syndrome   . History of diverticulitis   . History of dysphagia   . History of vertigo   . Hypercholesteremia   . Hypertension   . Hypothyroidism   . Migraine   . Neuralgia 2014  . Obesity   . Polymyalgia rheumatica ([place])     Surgical History: Past Surgical History:  Procedure Laterality Date  . ABDOMINAL HYSTERECTOMY    . BREAST CYST ASPIRATION Right +/- 10 yrs ago   x 2  . CHOLECYSTECTOMY    . COLON SURGERY    . COLONOSCOPY  09/08/2006  . COLONOSCOPY WITH PROPOFOL N/A 07/16/2017   Procedure: COLONOSCOPY WITH PROPOFOL;  Surgeon: Manya Silvas, MD;  Location: [facility] ENDOSCOPY;  Service: Endoscopy;  Laterality: N/A;  . ESOPHAGOGASTRODUODENOSCOPY  01/24/2009  . HERNIA REPAIR    . JOINT REPLACEMENT Right    knee 2015  . THYROIDECTOMY  Home Medications:  Allergies as of 07/08/2019      Reactions   Lac Bovis Other (See Comments)   Lactose Intolerance, headache LactoseIntolerance Lactose Intolerance, headache   Milk-related Compounds    Peanut Oil Other (See Comments)   Headache Headache   Peanut-containing Drug Products    Demerol [meperidine] Palpitations, Other (See Comments)   RAYS COMES OUT OF EYES      Medication List       Accurate as of July 08, 2019 11:59 PM. If you have any questions, ask your nurse or doctor.        STOP taking these medications   cephALEXin 500 MG capsule Commonly known as: KEFLEX Stopped by: Adison Reifsteck, PA-C   ranitidine 150  MG capsule Commonly known as: ZANTAC Stopped by: Daeshawn Redmann, PA-C   senna-docusate 8.6-50 MG tablet Commonly known as: Senokot-S Stopped by: Yusef Lamp, PA-C   sulfamethoxazole-trimethoprim 800-160 MG tablet Commonly known as: BACTRIM DS Stopped by: Kallen Delatorre, PA-C     TAKE these medications   [facility] D3 125 MCG (5000 UT) capsule Generic drug: Cholecalciferol TAKE 5,000 UNITS BY MOUTH ONCE DAILY.   estradiol 0.1 MG/GM vaginal cream Commonly known as: ESTRACE VAGINAL Apply 0.5mg  (pea-sized amount)  just inside the vaginal introitus with a finger-tip on Monday, Wednesday and Friday nights.   famotidine 40 MG tablet Commonly known as: PEPCID Take 40 mg by mouth at bedtime.   famotidine 20 MG tablet Commonly known as: Pepcid Take 1 tablet (20 mg total) by mouth daily.   gabapentin 400 MG capsule Commonly known as: NEURONTIN Take 400 mg by mouth 3 (three) times daily.   losartan 25 MG tablet Commonly known as: COZAAR Take 25 mg by mouth daily. What changed: Another medication with the same name was removed. Continue taking this medication, and follow the directions you see here. Changed by: Michiel Cowboy, PA-C   meclizine 25 MG tablet Commonly known as: ANTIVERT Take 1 tablet (25 mg total) by mouth 3 (three) times daily as needed.   nitrofurantoin (macrocrystal-monohydrate) 100 MG capsule Commonly known as: MACROBID Take 1 capsule (100 mg total) by mouth daily.   oxybutynin 10 MG 24 hr tablet Commonly known as: DITROPAN-XL Take 1 tablet (10 mg total) by mouth daily.   PARoxetine 40 MG tablet Commonly known as: PAXIL Take 40 mg by mouth daily.   phenazopyridine 200 MG tablet Commonly known as: PYRIDIUM Take 1 tablet (200 mg total) by mouth 3 (three) times daily.   promethazine 12.5 MG tablet Commonly known as: PHENERGAN Take by mouth.   simvastatin 20 MG tablet Commonly known as: ZOCOR Take by mouth 2 (two) times daily.   sucralfate 1 g  tablet Commonly known as: Carafate Take 1 tablet (1 g total) by mouth 4 (four) times daily.       Allergies:  Allergies  Allergen Reactions  . Lac Bovis Other (See Comments)    Lactose Intolerance, headache LactoseIntolerance Lactose Intolerance, headache   . Milk-Related Compounds   . Peanut Oil Other (See Comments)    Headache Headache   . Peanut-Containing Drug Products   . Demerol [Meperidine] Palpitations and Other (See Comments)    RAYS COMES OUT OF EYES    Family History: Family History  Problem Relation Age of Onset  . Arthritis Mother   . Arthritis Father   . COPD Father   . Arthritis Sister   . Diabetes Sister   . Cancer Brother   . COPD Brother   .  Cancer Son   . Drug abuse Son   . Stroke Maternal Grandmother   . Arthritis Paternal Grandfather   . Breast cancer Neg Hx     Social History:  reports that she quit smoking about 37 years ago. She has never used smokeless tobacco. She reports that she does not drink alcohol or use drugs.  ROS: For pertinent review of systems please refer to history of present illness  Physical Exam: BP 116/70 (BP Location: Left Arm, Patient Position: Sitting, Cuff Size: Large)   Pulse 78   Ht 5' (1.524 m)   Wt 194 lb (88 kg)   BMI 37.89 kg/m   Constitutional:  Well nourished. Alert and oriented, No acute distress. HEENT: [place] AT, mask in place.  Trachea midline, no masses. Cardiovascular: No clubbing, cyanosis, or edema. Respiratory: Normal respiratory effort, no increased work of breathing. Neurologic: Grossly intact, no focal deficits, moving all 4 extremities. Psychiatric: Normal mood and affect.   Laboratory Data: Lab Results  Component Value Date   WBC 8.2 04/27/2016   HGB 15.7 04/27/2016   HCT 44.3 04/27/2016   MCV 93.5 04/27/2016   PLT 196 04/27/2016    Lab Results  Component Value Date   CREATININE 0.70 06/29/2018    Lab Results  Component Value Date   AST 29 04/27/2016   Lab Results   Component Value Date   ALT 17 04/27/2016    Urinalysis Component     Latest Ref Rng & Units 07/08/2019  Specific Gravity, UA     1.005 - 1.030 1.025  pH, UA     5.0 - 7.5 5.5  Color, UA     Yellow Yellow  Appearance Ur     Clear Cloudy (A)  Leukocytes,UA     Negative 1+ (A)  Protein,UA     Negative/Trace Negative  Glucose, UA     Negative Negative  Ketones, UA     Negative Negative  RBC, UA     Negative Trace (A)  Bilirubin, UA     Negative Negative  Urobilinogen, Ur     0.2 - 1.0 mg/dL 2.0 (H)  Nitrite, UA     Negative Positive (A)  Microscopic Examination      See below:   Component     Latest Ref Rng & Units 07/08/2019  WBC, UA     0 - 5 /hpf 11-30 (A)  RBC     0 - 2 /hpf 0-2  Epithelial Cells (non renal)     0 - 10 /hpf 0-10  Bacteria, UA     None seen/Few Many (A)   I have reviewed the labs.   Pertinent Imaging: Results for PERRIN, EDDLEMAN (MRN [id number]) as of 07/19/2019 16:09  Ref. Range 07/08/2019 14:48  Scan Result Unknown 31mL      Assessment & Plan:    1. rUTI's Failed prophylactic nitrofurantoin We will obtain a renal ultrasound to evaluate for surgical corrective issues that may be causing recurrent urinary tract infections We will also need to consider cystoscopy in the future pending renal ultrasound results UA grossly infected will send for culture, will hold on prescribing antibiotic until urine culture and sensitivities are available  2.  Vaginal atrophy Continue the vaginal estrogen cream Monday, Wednesday, and Friday nights  3. OAB Patient will continue the oxybutynin XL 10 mg daily She return to clinic in 3 months for OAB questionnaire and PVR   Return for pending urine culture results .  These notes generated  with voice recognition software. I apologize for typographical errors.  Zara Council, PA-C  [facility] [address]  [place],  [address] [phone]

## 2019-07-09 ENCOUNTER — Ambulatory Visit
Admission: RE | Admit: 2019-07-09 | Discharge: 2019-07-09 | Disposition: A | Discharge status: Home / Self Care | Payer: Medicare HMO | Source: Ambulatory Visit | Attending: Physician Assistant | Admitting: Physician Assistant

## 2019-07-09 DIAGNOSIS — E079 Disorder of thyroid, unspecified: Secondary | ICD-10-CM | POA: Insufficient documentation

## 2019-07-12 ENCOUNTER — Other Ambulatory Visit: Payer: Self-pay | Admitting: Family Medicine

## 2019-07-12 DIAGNOSIS — R3915 Urgency of urination: Secondary | ICD-10-CM

## 2019-07-12 DIAGNOSIS — R911 Solitary pulmonary nodule: Secondary | ICD-10-CM

## 2019-07-13 ENCOUNTER — Telehealth: Payer: Self-pay | Admitting: Family Medicine

## 2019-07-13 LAB — CULTURE, URINE COMPREHENSIVE

## 2019-07-13 MED ORDER — SULFAMETHOXAZOLE-TRIMETHOPRIM 800-160 MG PO TABS: 1.0000 | ORAL_TABLET | Freq: Two times a day (BID) | ORAL | 0 refills | Status: DC

## 2019-07-13 NOTE — Telephone Encounter (Signed)
Patient notified and ABX sent to pharmacy.  

## 2019-07-13 NOTE — Telephone Encounter (Signed)
-----   Message from Harle Battiest, PA-C sent at 07/13/2019 11:57 AM EDT ----- Please let Mrs. Kobrin know that her urine culture was positive for infection.  We need her to start Septra DS, BID x 7 days.

## 2019-07-21 ENCOUNTER — Ambulatory Visit
Admission: RE | Admit: 2019-07-21 | Discharge: 2019-07-21 | Disposition: A | Discharge status: Home / Self Care | Payer: Medicare HMO | Source: Ambulatory Visit | Attending: Family Medicine | Admitting: Family Medicine

## 2019-07-21 ENCOUNTER — Other Ambulatory Visit: Payer: Self-pay

## 2019-07-21 DIAGNOSIS — R3915 Urgency of urination: Secondary | ICD-10-CM | POA: Diagnosis present

## 2019-07-21 DIAGNOSIS — R911 Solitary pulmonary nodule: Secondary | ICD-10-CM | POA: Diagnosis present

## 2019-07-27 ENCOUNTER — Other Ambulatory Visit: Payer: Self-pay | Admitting: Urology

## 2019-07-29 ENCOUNTER — Ambulatory Visit
Admission: RE | Admit: 2019-07-29 | Discharge: 2019-07-29 | Disposition: A | Discharge status: Home / Self Care | Payer: Medicare HMO | Source: Ambulatory Visit | Attending: Urology | Admitting: Urology

## 2019-07-29 ENCOUNTER — Other Ambulatory Visit: Payer: Self-pay

## 2019-07-29 DIAGNOSIS — N390 Urinary tract infection, site not specified: Secondary | ICD-10-CM | POA: Insufficient documentation

## 2019-07-30 ENCOUNTER — Telehealth: Payer: Self-pay | Admitting: Family Medicine

## 2019-07-30 NOTE — Telephone Encounter (Signed)
Patient notified and voiced understanding. Appointment has been scheduled. 

## 2019-07-30 NOTE — Telephone Encounter (Signed)
-----   Message from Harle Battiest, PA-C sent at 07/30/2019  8:16 AM EDT ----- Please let Mrs. Geffert know that her renal ultrasound just showed renal cysts which are benign.  I would like to see her in three months for OAB questionnaire and PVR.

## 2019-09-27 ENCOUNTER — Other Ambulatory Visit: Payer: Self-pay | Admitting: Physician Assistant

## 2019-09-27 ENCOUNTER — Other Ambulatory Visit: Payer: Self-pay | Admitting: Nurse Practitioner

## 2019-09-27 DIAGNOSIS — Z1231 Encounter for screening mammogram for malignant neoplasm of breast: Secondary | ICD-10-CM

## 2019-10-29 DIAGNOSIS — N1831 Chronic kidney disease, stage 3a: Secondary | ICD-10-CM | POA: Insufficient documentation

## 2019-11-02 ENCOUNTER — Encounter: Payer: Self-pay | Admitting: Urology

## 2019-11-02 ENCOUNTER — Other Ambulatory Visit: Payer: Self-pay | Admitting: Urology

## 2019-11-02 ENCOUNTER — Ambulatory Visit (INDEPENDENT_AMBULATORY_CARE_PROVIDER_SITE_OTHER): Payer: Medicare HMO | Admitting: Urology

## 2019-11-02 ENCOUNTER — Other Ambulatory Visit: Payer: Self-pay | Admitting: Physician Assistant

## 2019-11-02 ENCOUNTER — Other Ambulatory Visit: Payer: Self-pay

## 2019-11-02 VITALS — BP 121/73 | HR 87 | Ht 60.0 in | Wt 198.0 lb

## 2019-11-02 DIAGNOSIS — N390 Urinary tract infection, site not specified: Secondary | ICD-10-CM

## 2019-11-02 DIAGNOSIS — Z780 Asymptomatic menopausal state: Secondary | ICD-10-CM

## 2019-11-02 DIAGNOSIS — N952 Postmenopausal atrophic vaginitis: Secondary | ICD-10-CM

## 2019-11-02 DIAGNOSIS — N3281 Overactive bladder: Secondary | ICD-10-CM | POA: Diagnosis not present

## 2019-11-02 LAB — URINALYSIS, COMPLETE
Bilirubin, UA: NEGATIVE
Glucose, UA: NEGATIVE
Ketones, UA: NEGATIVE
Nitrite, UA: POSITIVE — AB
Protein,UA: NEGATIVE
Specific Gravity, UA: 1.015 (ref 1.005–1.030)
Urobilinogen, Ur: 0.2 mg/dL (ref 0.2–1.0)
pH, UA: 5.5 (ref 5.0–7.5)

## 2019-11-02 LAB — MICROSCOPIC EXAMINATION

## 2019-11-02 LAB — BLADDER SCAN AMB NON-IMAGING: Scan Result: 38

## 2019-11-02 NOTE — Progress Notes (Signed)
07/08/2019 4:36 PM   Alice [name] Alice [name] [name] May 19, 1940 [id number]  Referring provider: Dione Housekeeper, MD [address] [place],  [place] [address]  Chief Complaint  Alice [name] [name] presents with  . Over Active Bladder    HPI: Alice [name] Alice [name] [name] is a 79 year old female with recurrent UTI, OAB and constipation who presents today for three month follow up.    She was first seen by Dr. Richardo Hanks on November 12, 2018 and was placed on oxybutynin 10 mg XL, advised her to minimize soda intake, start cranberry tablets twice daily and nitrofurantoin daily for UTI prophylaxis.  KUB did not note stones, but was positive for constipation.    At her visit on 02/22/2019, she was complaining of frequency, urgency, dysuria, nocturia, incontinence and straining to urinate.  She was also experiencing diarrhea and was having chills.  UA was positive for trace blood, nitrates and +1 leukocytes.  Microscopically it is positive for 11-30 WBCs and many bacteria.  Urine culture was positive for E. Coli.  Her PVR was 29 mL.  She did fill that Alice [name] oxybutynin XL 10 mg help with her urinary frequency, but for Alice [name] Macrobid prophylaxis she is not able to take it consistently.  Vaginal estrogen cream was also added to her therapy plan.  She had a visit with her PCP on June 22, 2019 and at that visit she was experiencing an increase of frequency, dysuria and urgency.  Urine dip was nitrite positive and urine culture returned positive for Klebsiella pneumoniae as it was resistant to nitrofurantoin.  She was given three days of Keflex.    At her visit with me on July 08, 2019, she again grew out Klebsiella pneumonia resistant to ampicillin and nitrofurantoin.  She was prescribed culture appropriate antibiotic for 7 days.   RUS 07/29/2019 negative for hydro with bilateral cysts.    Today, Alice [name] Alice [name] [name] is  experiencing urgency x 0-3 (improved), frequency x 4-7(stable), not restricting fluids to avoid visits to Alice [name] restroom, is not engaging  in toilet mapping, incontinence  x 0-3 (stable) and nocturia x 0-3 (stable).   Her BP is 121/73.   Her PVR is 38 mL.    She is also having dysuria, urgent urination and incontinence and lower back pain.  Alice [name] [name] denies any modifying or aggravating factors.  Alice [name] [name] denies any gross hematuria, dysuria or suprapubic/flank pain.  Alice [name] [name] denies any fevers, chills, nausea or vomiting.   She continues prophylaxis with nitrofurantoin 100 mg daily.    Her UA today is yellow cloudy, specific gravity 1.015, trace blood, nitrite positive, trace leukocyte, 11-30 WBCs, 0-2 RBCs, 0-10 epithelial cells and many bacteria.  PMH: Past Medical History:  Diagnosis Date  . Anxiety   . Arthritis   . Depression   . Fibromyalgia   . GERD (gastroesophageal reflux disease)   . History of carpal tunnel syndrome   . History of diverticulitis   . History of dysphagia   . History of vertigo   . Hypercholesteremia   . Hypertension   . Hypothyroidism   . Migraine   . Neuralgia 2014  . Obesity   . Polymyalgia rheumatica (HCC)     Surgical History: Past Surgical History:  Procedure Laterality Date  . ABDOMINAL HYSTERECTOMY    . BREAST CYST ASPIRATION Right +/- 10 yrs ago   x 2  . CHOLECYSTECTOMY    . COLON SURGERY    . COLONOSCOPY  09/08/2006  . COLONOSCOPY WITH PROPOFOL N/A 07/16/2017   Procedure: COLONOSCOPY  WITH PROPOFOL;  Surgeon: Scot JunElliott, Robert T, MD;  Location: [facility] ENDOSCOPY;  Service: Endoscopy;  Laterality: N/A;  . ESOPHAGOGASTRODUODENOSCOPY  01/24/2009  . HERNIA REPAIR    . JOINT REPLACEMENT Right    knee 2015  . THYROIDECTOMY      Home Medications:  Allergies as of 11/02/2019      Reactions   Lac Bovis Other (See Comments)   Lactose Intolerance, headache LactoseIntolerance Lactose Intolerance, headache   Milk-related Compounds    Peanut Oil Other (See Comments)   Headache Headache   Peanut-containing Drug Products    Demerol [meperidine] Palpitations, Other (See Comments)   RAYS  COMES OUT OF EYES      Medication List       Accurate as of November 02, 2019  4:36 PM. If you have any questions, ask your nurse or doctor.        STOP taking these medications   meclizine 25 MG tablet Commonly known as: ANTIVERT Stopped by: Shephanie Romas, PA-C   phenazopyridine 200 MG tablet Commonly known as: PYRIDIUM Stopped by: Shakeia Krus, PA-C   promethazine 12.5 MG tablet Commonly known as: PHENERGAN Stopped by: Amana Bouska, PA-C   sucralfate 1 g tablet Commonly known as: Carafate Stopped by: Christian Treadway, PA-C   sulfamethoxazole-trimethoprim 800-160 MG tablet Commonly known as: BACTRIM DS Stopped by: Meisha Salone, PA-C     TAKE these medications   [facility] D3 125 MCG (5000 UT) capsule Generic drug: Cholecalciferol TAKE 5,000 UNITS BY MOUTH ONCE DAILY.   estradiol 0.1 MG/GM vaginal cream Commonly known as: ESTRACE VAGINAL Apply 0.5mg  (pea-sized amount)  just inside Alice [name] vaginal introitus with a finger-tip on Monday, Wednesday and Friday nights.   famotidine 40 MG tablet Commonly known as: PEPCID Take 40 mg by mouth at bedtime.   famotidine 20 MG tablet Commonly known as: Pepcid Take 1 tablet (20 mg total) by mouth daily.   fluticasone 50 MCG/ACT nasal spray Commonly known as: FLONASE Place into Alice [name] nose.   gabapentin 400 MG capsule Commonly known as: NEURONTIN Take 400 mg by mouth 3 (three) times daily.   losartan 25 MG tablet Commonly known as: COZAAR Take 25 mg by mouth daily.   nitrofurantoin (macrocrystal-monohydrate) 100 MG capsule Commonly known as: MACROBID Take 1 capsule (100 mg total) by mouth daily.   oxybutynin 10 MG 24 hr tablet Commonly known as: DITROPAN-XL Take 1 tablet (10 mg total) by mouth daily.   PARoxetine 40 MG tablet Commonly known as: PAXIL Take 40 mg by mouth daily.   simvastatin 20 MG tablet Commonly known as: ZOCOR Take by mouth 2 (two) times daily.       Allergies:  Allergies  Allergen  Reactions  . Lac Bovis Other (See Comments)    Lactose Intolerance, headache LactoseIntolerance Lactose Intolerance, headache   . Milk-Related Compounds   . Peanut Oil Other (See Comments)    Headache Headache   . Peanut-Containing Drug Products   . Demerol [Meperidine] Palpitations and Other (See Comments)    RAYS COMES OUT OF EYES    Family History: Family History  Problem Relation Age of Onset  . Arthritis Mother   . Arthritis Father   . COPD Father   . Arthritis Sister   . Diabetes Sister   . Cancer Brother   . COPD Brother   . Cancer Son   . Drug abuse Son   . Stroke Maternal Grandmother   . Arthritis Paternal Grandfather   . Breast cancer Neg Hx  Social History:  reports that she quit smoking about 37 years ago. She has never used smokeless tobacco. She reports that she does not drink alcohol and does not use drugs.  ROS: For pertinent review of systems please refer to history of present illness  Physical Exam: BP 121/73   Pulse 87   Ht 5' (1.524 m)   Wt 198 lb (89.8 kg)   BMI 38.67 kg/m   Constitutional:  Well nourished. Alert and oriented, No acute distress. HEENT: [place] AT, mask in place.  Trachea midline Cardiovascular: No clubbing, cyanosis, or edema. Respiratory: Normal respiratory effort, no increased work of breathing. Neurologic: Grossly intact, no focal deficits, moving all 4 extremities. Psychiatric: Normal mood and affect.   Laboratory Data: Lab Results  Component Value Date   WBC 8.2 04/27/2016   HGB 15.7 04/27/2016   HCT 44.3 04/27/2016   MCV 93.5 04/27/2016   PLT 196 04/27/2016    Lab Results  Component Value Date   CREATININE 0.70 06/29/2018    Lab Results  Component Value Date   AST 29 04/27/2016   Lab Results  Component Value Date   ALT 17 04/27/2016    Urinalysis Component     Latest Ref Rng & Units 11/02/2019  Specific Gravity, UA     1.005 - 1.030 1.015  pH, UA     5.0 - 7.5 5.5  Color, UA     Yellow Yellow   Appearance Ur     Clear Cloudy (A)  Leukocytes,UA     Negative Trace (A)  Protein,UA     Negative/Trace Negative  Glucose, UA     Negative Negative  Ketones, UA     Negative Negative  RBC, UA     Negative Trace (A)  Bilirubin, UA     Negative Negative  Urobilinogen, Ur     0.2 - 1.0 mg/dL 0.2  Nitrite, UA     Negative Positive (A)  Microscopic Examination      See below:   Component     Latest Ref Rng & Units 11/02/2019  WBC, UA     0 - 5 /hpf 11-30 (A)  RBC     0 - 2 /hpf 0-2  Epithelial Cells (non renal)     0 - 10 /hpf 0-10  Bacteria, UA     None seen/Few Many (A)     I have reviewed Alice [name] labs.   Pertinent Imaging: Results for DEYANA, WNUK (MRN [id number]) as of 11/02/2019 16:27  Ref. Range 11/02/2019 13:54  Scan Result Unknown 38    Assessment & Plan:    1. rUTI's Failed prophylactic nitrofurantoin Renal ultrasound with bilateral cysts We will schedule a cytoscopy for further evaluation I have explained to Alice [name] Alice [name] [name] that they will  be scheduled for a cystoscopy in our office to evaluate their bladder.  Alice [name] cystoscopy consists of passing a tube with a lens up through their urethra and into their urinary bladder.   We will inject Alice [name] urethra with a lidocaine gel prior to introducing Alice [name] cystoscope to help with any discomfort during Alice [name] procedure.   After Alice [name] procedure, they might experience blood in Alice [name] urine and discomfort with urination.  This will abate after Alice [name] first few voids.  I have  encouraged Alice [name] Alice [name] [name] to increase water intake  during this time.  Alice [name] [name] denies any allergies to lidocaine.  UA grossly infected will send for culture, will hold on prescribing antibiotic until urine culture and sensitivities are available  2.  Vaginal atrophy Encouraged Alice [name] continual use of vaginal vaginal estrogen cream Monday, Wednesday, and Friday nights  3. OAB Alice [name] [name] will continue Alice [name] oxybutynin XL 10 mg daily  Return for schedule cystoscopy for rUTI's  with Dr. Richardo Hanks .  These notes generated with voice recognition software. I apologize for typographical errors.  [facility] [address]  [address] [place], [place] [address] [phone]  I, Francina Ames Peace, am acting as a Neurosurgeon for [employer], PA-C  I have reviewed Alice [name] above documentation for accuracy and completeness, and I agree with Alice [name] above.    Michiel Cowboy, PA-C  I spent 30 minutes on Alice [name] day of Alice [name] encounter to include pre-visit record review, face-to-face time with Alice [name] Alice [name] [name], and post-visit ordering of tests.

## 2019-11-07 LAB — CULTURE, URINE COMPREHENSIVE

## 2019-11-08 ENCOUNTER — Telehealth: Payer: Self-pay | Admitting: Family Medicine

## 2019-11-08 NOTE — Telephone Encounter (Signed)
Patient notified and voiced understanding. Appointment has been made.  

## 2019-11-08 NOTE — Progress Notes (Signed)
Error

## 2019-11-08 NOTE — Telephone Encounter (Signed)
-----   Message from Harle Battiest, PA-C sent at 11/07/2019  8:19 PM EDT ----- Mrs. Crusoe grew out more than 3 organisms and will need a CATH UA for culture.

## 2019-11-09 ENCOUNTER — Emergency Department: Payer: Medicare HMO

## 2019-11-09 ENCOUNTER — Other Ambulatory Visit: Payer: Self-pay

## 2019-11-09 ENCOUNTER — Encounter: Payer: Self-pay | Admitting: Emergency Medicine

## 2019-11-09 ENCOUNTER — Ambulatory Visit (INDEPENDENT_AMBULATORY_CARE_PROVIDER_SITE_OTHER): Payer: Medicare HMO | Admitting: Urology

## 2019-11-09 ENCOUNTER — Emergency Department
Admission: EM | Admit: 2019-11-09 | Discharge: 2019-11-09 | Disposition: A | Discharge status: Home / Self Care | Payer: Medicare HMO | Attending: Emergency Medicine | Admitting: Emergency Medicine

## 2019-11-09 DIAGNOSIS — E039 Hypothyroidism, unspecified: Secondary | ICD-10-CM | POA: Diagnosis not present

## 2019-11-09 DIAGNOSIS — I10 Essential (primary) hypertension: Secondary | ICD-10-CM | POA: Insufficient documentation

## 2019-11-09 DIAGNOSIS — M25561 Pain in right knee: Secondary | ICD-10-CM | POA: Insufficient documentation

## 2019-11-09 DIAGNOSIS — Y929 Unspecified place or not applicable: Secondary | ICD-10-CM | POA: Diagnosis not present

## 2019-11-09 DIAGNOSIS — Y999 Unspecified external cause status: Secondary | ICD-10-CM | POA: Insufficient documentation

## 2019-11-09 DIAGNOSIS — N390 Urinary tract infection, site not specified: Secondary | ICD-10-CM

## 2019-11-09 DIAGNOSIS — S3992XA Unspecified injury of lower back, initial encounter: Secondary | ICD-10-CM | POA: Insufficient documentation

## 2019-11-09 DIAGNOSIS — Z96653 Presence of artificial knee joint, bilateral: Secondary | ICD-10-CM | POA: Diagnosis not present

## 2019-11-09 DIAGNOSIS — Z91010 Allergy to peanuts: Secondary | ICD-10-CM | POA: Insufficient documentation

## 2019-11-09 DIAGNOSIS — Z79890 Hormone replacement therapy (postmenopausal): Secondary | ICD-10-CM | POA: Insufficient documentation

## 2019-11-09 DIAGNOSIS — Y939 Activity, unspecified: Secondary | ICD-10-CM | POA: Insufficient documentation

## 2019-11-09 DIAGNOSIS — Z79899 Other long term (current) drug therapy: Secondary | ICD-10-CM | POA: Insufficient documentation

## 2019-11-09 DIAGNOSIS — M25562 Pain in left knee: Secondary | ICD-10-CM

## 2019-11-09 DIAGNOSIS — Z87891 Personal history of nicotine dependence: Secondary | ICD-10-CM | POA: Diagnosis not present

## 2019-11-09 DIAGNOSIS — W1809XA Striking against other object with subsequent fall, initial encounter: Secondary | ICD-10-CM | POA: Diagnosis not present

## 2019-11-09 MED ORDER — TRAMADOL HCL 50 MG PO TABS: 50.0000 mg | ORAL_TABLET | Freq: Three times a day (TID) | ORAL | 0 refills | Status: AC | PRN

## 2019-11-09 NOTE — Discharge Instructions (Signed)
Your exam and x-ray are negative at this time for any acute fracture to the knee or tailbone.  Your x-ray does reveal significant arthritis to the knee.  No fracture or dislocation patella.  Take over-the-counter Tylenol or the prescription tramadol as needed for pain.  Follow-up with your doctor as needed.

## 2019-11-09 NOTE — ED Triage Notes (Signed)
Pt reports a week ago was walking behind her truck and hit her knee on the truck ball causing her to fall. Pt reports still has pain to right knee and tailbone. Pt ambulatory with cane

## 2019-11-09 NOTE — Addendum Note (Signed)
Addended by: Honor Loh on: 11/09/2019 02:09 PM   Modules accepted: Orders

## 2019-11-09 NOTE — Progress Notes (Signed)
In and Out Catheterization  Patient is present today for a I & O catheterization due to contaminated urine. Patient was cleaned and prepped in a sterile fashion with betadine . A 14 FR cath was inserted  , 14 ml of urine return was noted, urine was yellow in color. A clean urine sample was collected for UA and urine culture. Bladder was drained  And catheter was removed with out difficulty.    Preformed by: Michiel Cowboy, PA-C  Follow up/ Additional notes: Follow up will be pending her urine culture results  I, Kyla Peace, am acting as a Neurosurgeon for [employer], PA-C  I have reviewed the above documentation for accuracy and completeness, and I agree with the above.    Michiel Cowboy, PA-C

## 2019-11-09 NOTE — ED Provider Notes (Signed)
[facility] Emergency Department Provider Note ____________________________________________  Time seen: 1323  I have reviewed the triage vital signs and the nursing notes.  HISTORY  Chief Complaint  Fall, Tailbone Pain, and Knee Pain  HPI Alice [name] is a 79 y.o. female presents herself to the ED for evaluation of injury sustained following mechanical fall last week.  Patient with parent was walking behind her truck, when she hit her left knee on the trailer hitch ball.  This caused patient to fall landed on her buttocks.  She presents now due to ongoing pain to the right knee and the tailbone.  She has a history of severe arthritis on the left knee which she is aware of.  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  She denies any head injury or loss of consciousness related to her mechanical fall.  Past Medical History:  Diagnosis Date  . Anxiety   . Arthritis   . Depression   . Fibromyalgia   . GERD (gastroesophageal reflux disease)   . History of carpal tunnel syndrome   . History of diverticulitis   . History of dysphagia   . History of vertigo   . Hypercholesteremia   . Hypertension   . Hypothyroidism   . Migraine   . Neuralgia 2014  . Obesity   . Polymyalgia rheumatica [facility])     Patient Active Problem List   Diagnosis Date Noted  . Lumbar radiculopathy 07/08/2019  . Abnormal gait 06/17/2018  . Displacement of lumbar intervertebral disc without myelopathy 06/17/2018  . Impingement syndrome of shoulder region 06/17/2018  . Knee stiff 06/17/2018  . Mild cognitive impairment 07/03/2017  . Generalized abdominal pain 05/08/2017  . Hx of adenomatous colonic polyps 05/08/2017  . Increased frequency of urination 05/08/2017  . Urinary and fecal incontinence 05/08/2017  . Chronic pain syndrome 07/09/2016  . Musculoskeletal pain 07/09/2016  . Severe obesity (BMI 35.0-39.9) with comorbidity (HCC) 12/04/2015  . Chronic knee  pain (S/P TKR) (Right) 12/04/2015  . Chronic knee pain (Left) 12/04/2015  . 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) (Right) 12/04/2015  . Osteoarthritis of knee (Left) 12/04/2015  . Carpal tunnel syndrome 11/23/2015  . Depression 11/23/2015  . Fibromyalgia 11/23/2015  . GERD (gastroesophageal reflux disease) 11/23/2015  . History of dysphagia 11/23/2015  . Hypothyroidism 11/23/2015  . Hyperlipidemia 11/23/2015  . Hypertension 11/23/2015  . Migraine headache 11/23/2015  . Osteoarthritis (arthritis due to wear and tear of joints) 11/23/2015  . Chronic low back pain (Location of Tertiary source of pain) (Bilateral) (L>R) 11/23/2015  . Chronic lower extremity pain (Bilateral) (L>R) 11/23/2015  . Chronic knee pain (Location of Primary Source of Pain) (Bilateral) (L>R) 11/23/2015  . Chronic hip pain (Location of Secondary source of pain) (Bilateral) (L>R) 11/23/2015  . 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) 11/23/2015  . Lumbar facet syndrome (Bilateral) (L>R) 11/23/2015  . Chronic sacroiliac joint pain (Bilateral) (L>R) 11/23/2015  . Long term current use of opiate analgesic 11/23/2015  . Long term prescription opiate use 11/23/2015  . Opiate use 11/23/2015  . Long term current use of non-steroidal anti-inflammatories (NSAID) 11/23/2015  . Disturbance of skin sensation 11/23/2015  . Diverticulosis 11/23/2015  . Neurogenic pain 11/23/2015  . 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 Encounter for pain management planning 11/23/2015  . Encounter for therapeutic drug level monitoring 11/23/2015  . Chronic neck pain 11/23/2015  . Lumbar spondylosis 11/23/2015  . Cervical spondylosis 11/23/2015  .  History of TKR (total knee replacement) (Right) 11/23/2015  . Anxiety 08/25/2013  . Neuralgia 01/25/2013  . No transfusions per religious beliefs 02/27/2012    Past Surgical History:  Procedure Laterality Date  . ABDOMINAL HYSTERECTOMY    . BREAST CYST  ASPIRATION Right +/- 10 yrs ago   x 2  . CHOLECYSTECTOMY    . COLON SURGERY    . COLONOSCOPY  09/08/2006  . COLONOSCOPY WITH PROPOFOL N/A 07/16/2017   Procedure: COLONOSCOPY WITH PROPOFOL;  Surgeon: Scot Jun, MD;  [facility] ENDOSCOPY;  Service: Endoscopy;  Laterality: N/A;  . ESOPHAGOGASTRODUODENOSCOPY  01/24/2009  . HERNIA REPAIR    . JOINT REPLACEMENT Right    knee 2015  . THYROIDECTOMY      Prior to Admission medications   Medication Sig Start Date End Date Taking? Authorizing Provider  [facility]S D3 5000 units capsule TAKE 5,000 UNITS BY MOUTH ONCE DAILY. 10/10/15   [provider]  estradiol (ESTRACE VAGINAL) 0.1 MG/GM vaginal cream Apply 0.5mg  (pea-sized amount)  just inside the vaginal introitus with a finger-tip on Monday, Wednesday and Friday nights. 02/22/19   Michiel Cowboy A, PA-C  famotidine (PEPCID) 20 MG tablet Take 1 tablet (20 mg total) by mouth daily. 06/20/19 06/19/20  Phineas Semen, MD  famotidine (PEPCID) 40 MG tablet Take 40 mg by mouth at bedtime. 04/19/19   [provider]  fluticasone (FLONASE) 50 MCG/ACT nasal spray Place into the nose. 10/28/19 10/27/20  [provider]  gabapentin (NEURONTIN) 400 MG capsule Take 400 mg by mouth 3 (three) times daily.  09/23/15   [provider]  losartan (COZAAR) 25 MG tablet Take 25 mg by mouth daily. 04/29/19   [provider]  nitrofurantoin, macrocrystal-monohydrate, (MACROBID) 100 MG capsule TAKE 1 CAPSULE BY MOUTH EVERY DAY 11/02/19   McGowan, Carollee Herter A, PA-C  oxybutynin (DITROPAN-XL) 10 MG 24 hr tablet Take 1 tablet (10 mg total) by mouth daily. 11/12/18   Sondra Come, MD  PARoxetine (PAXIL) 40 MG tablet Take 40 mg by mouth daily. 09/11/15   [provider]  simvastatin (ZOCOR) 20 MG tablet Take by mouth 2 (two) times daily.  05/09/15   [provider]  traMADol (ULTRAM) 50 MG tablet Take 1 tablet (50 mg total) by mouth 3 (three) times daily as needed for up to  3 days. 11/09/19 11/12/19  Mika Griffitts, Charlesetta Ivory, PA-C    Allergies Lac bovis, Milk-related compounds, Peanut oil, Peanut-containing drug products, and Demerol [meperidine]  Family History  Problem Relation Age of Onset  . Arthritis Mother   . Arthritis Father   . COPD Father   . Arthritis Sister   . Diabetes Sister   . Cancer Brother   . COPD Brother   . Cancer Son   . Drug abuse Son   . Stroke Maternal Grandmother   . Arthritis Paternal Grandfather   . Breast cancer Neg Hx     Social History Social History   Tobacco Use  . Smoking status: Former Smoker    Quit date: 02/26/1982    Years since quitting: 37.7  . Smokeless tobacco: Never Used  Vaping Use  . Vaping Use: Never used  Substance Use Topics  . Alcohol use: No  . Drug use: No    Review of Systems  Constitutional: Negative for fever. Cardiovascular: Negative for chest pain. Respiratory: Negative for shortness of breath. Gastrointestinal: Negative for abdominal pain, vomiting and diarrhea. Genitourinary: Negative for dysuria. Musculoskeletal: Negative for back pain.  Tailbone pain and left knee pain as above. Skin: Negative for rash. Neurological: Negative for headaches, focal weakness or numbness. ____________________________________________  PHYSICAL EXAM:  VITAL SIGNS: ED Triage Vitals  Enc Vitals Group     BP 11/09/19 1211 (!) 132/76     Pulse Rate 11/09/19 1211 77     Resp 11/09/19 1211 20     Temp 11/09/19 1211 99.1 F (37.3 C)     Temp Source 11/09/19 1211 Oral     SpO2 11/09/19 1211 96 %     Weight 11/09/19 1207 198 lb (89.8 kg)     Height 11/09/19 1207 5' (1.524 m)     Head Circumference --      Peak Flow --      Pain Score 11/09/19 1206 2     Pain Loc --      Pain Edu? --      Excl. in GC? --     Constitutional: Alert and oriented. Well appearing and in no distress. Head: Normocephalic and atraumatic. Eyes: Conjunctivae are normal. Normal extraocular movements Neck: Supple.   Normal range of motion without crepitus.  No distracting midline tenderness is noted. Cardiovascular: Normal rate, regular rhythm. Normal distal pulses. Respiratory: Normal respiratory effort. No wheezes/rales/rhonchi. Gastrointestinal: Soft and nontender. No distention. Musculoskeletal: Left knee without obvious deformity, dislocation, or effusion.  Patient with normal active range of motion to the knee but flexion extension.  No valgus or varus joint stress is elicited.  The patella tracks normally.  No popliteal space fullness is noted.  No calf or Achilles tenderness noted distally. Normal spinal alignment without significant tenderness, spasm, deformity, or step-off. normal range of motion in all extremities.  Neurologic: Cranial nerves II through XII grossly intact.  Normal gait without ataxia. Normal speech and language. No gross focal neurologic deficits are appreciated. Skin:  Skin is warm, dry and intact. No rash noted. ____________________________________________   RADIOLOGY  DG Coccyx IMPRESSION: Negative.  DG Left Knee IMPRESSION: Mild to moderate degenerative joint disease. No acute abnormality seen in the left knee ____________________________________________  PROCEDURES  Procedures ____________________________________________  INITIAL IMPRESSION / ASSESSMENT AND PLAN / ED COURSE  Geriatric patient with ED evaluation of injuries following a mechanical fall last week.  Patient's exam is overall benign reassuring at this time.  No signs of acute internal derangement to the knee or x-ray evidence of fracture or dislocation to the knee or tailbone.  Patient is discharged with a small prescription for tramadol to take as needed.  Should continue with over-the-counter extra strength Tylenol.  She will follow with primary provider for ongoing symptoms.  Alice [name] was evaluated in Emergency Department on 11/09/2019 for the symptoms described in the history of present  illness. She was evaluated in the context of the global COVID-19 pandemic, which necessitated consideration that the patient might be at risk for infection with the SARS-CoV-2 virus that causes COVID-19. Institutional protocols and algorithms that pertain to the evaluation of patients at risk for COVID-19 are in a state of rapid change based on information released by regulatory bodies including the CDC and federal and state organizations. These policies and algorithms were followed during the patient's care in the ED.  I reviewed the patient's prescription history over the last 12 months in the multi-state controlled substances database(s) that includes [place], [place], [place], [place], [place], [place], [place], [place], [place], [place], [place], [place], [place], and [place].  Results were notable for no current RX history.  ____________________________________________  FINAL CLINICAL IMPRESSION(S) / ED DIAGNOSES  Final diagnoses:  Acute pain of left knee  Tailbone injury, initial encounter      Lissa Hoard, PA-C 11/09/19 1426    Jene Every, MD 11/09/19 1440

## 2019-11-09 NOTE — ED Notes (Signed)
See triage note  States she fell about 1 week ago   States she hit her knee on the back of truck  States she conts to have tailbone pain and knee pain

## 2019-11-10 LAB — URINALYSIS, COMPLETE
Bilirubin, UA: NEGATIVE
Glucose, UA: NEGATIVE
Ketones, UA: NEGATIVE
Leukocytes,UA: NEGATIVE
Nitrite, UA: POSITIVE — AB
Protein,UA: NEGATIVE
RBC, UA: NEGATIVE
Specific Gravity, UA: 1.02 (ref 1.005–1.030)
Urobilinogen, Ur: 2 mg/dL — ABNORMAL HIGH (ref 0.2–1.0)
pH, UA: 5.5 (ref 5.0–7.5)

## 2019-11-10 LAB — MICROSCOPIC EXAMINATION

## 2019-11-11 ENCOUNTER — Inpatient Hospital Stay: Admission: RE | Admit: 2019-11-11 | Payer: Medicare HMO | Source: Ambulatory Visit

## 2019-11-11 ENCOUNTER — Ambulatory Visit: Payer: Medicare HMO

## 2019-11-15 ENCOUNTER — Telehealth: Payer: Self-pay

## 2019-11-15 NOTE — Telephone Encounter (Signed)
Patient called triage to inquire about "urine results," please advise.

## 2019-11-15 NOTE — Telephone Encounter (Signed)
Spoke to patient and she had to move the Cysto appointment. She is having real bad diarrhea and it smells awful. I informed her that if it continues tomorrow to contact her PCP to make sure she does not have C-Diff as it can cause severe diarrhea with a very foul odor. She is going to contact our office to schedule the cath UA when she gets her upset stomach resolved.

## 2019-11-15 NOTE — Telephone Encounter (Signed)
Unfortunately, her urine did not get sent for culture and she is scheduled for a cystoscopy with Dr. Richardo Hanks on Thursday.  I would have her come in tomorrow on the nurses schedule and CATH her again to see if her urine has improved.

## 2019-11-18 ENCOUNTER — Other Ambulatory Visit: Payer: Self-pay | Admitting: Urology

## 2019-11-22 NOTE — Telephone Encounter (Signed)
Would you call Mrs. Alice [name] and see if her diarrhea has resolved?  If so, we need to get her in for a CATH UA and culture.

## 2019-11-22 NOTE — Telephone Encounter (Signed)
Spoke to patient and she is better. Appointment has been made.

## 2019-11-23 ENCOUNTER — Ambulatory Visit
Admission: RE | Admit: 2019-11-23 | Discharge: 2019-11-23 | Disposition: A | Discharge status: Home / Self Care | Payer: Medicare HMO | Source: Ambulatory Visit | Attending: Physician Assistant | Admitting: Physician Assistant

## 2019-11-23 ENCOUNTER — Other Ambulatory Visit: Payer: Self-pay

## 2019-11-23 DIAGNOSIS — Z780 Asymptomatic menopausal state: Secondary | ICD-10-CM | POA: Insufficient documentation

## 2019-11-23 DIAGNOSIS — Z13820 Encounter for screening for osteoporosis: Secondary | ICD-10-CM | POA: Insufficient documentation

## 2019-11-23 DIAGNOSIS — Z1231 Encounter for screening mammogram for malignant neoplasm of breast: Secondary | ICD-10-CM | POA: Insufficient documentation

## 2019-11-23 DIAGNOSIS — Z96651 Presence of right artificial knee joint: Secondary | ICD-10-CM | POA: Insufficient documentation

## 2019-11-23 DIAGNOSIS — M797 Fibromyalgia: Secondary | ICD-10-CM | POA: Insufficient documentation

## 2019-11-23 NOTE — Progress Notes (Signed)
In and Out Catheterization  Patient is present today for a I & O catheterization due to UTI. Patient was cleaned and prepped in a sterile fashion with betadine . A 14 FR cath was inserted no complications were noted , 70 ml of urine return was noted, urine was yellow clear in color. A clean urine sample was collected for UA and urine culture. Bladder was drained  And catheter was removed with out difficulty.    Performed by: Michiel Cowboy, PA-C  Follow up/ Additional notes: Keep cystoscopy appointment

## 2019-11-24 ENCOUNTER — Encounter: Payer: Self-pay | Admitting: Urology

## 2019-11-24 ENCOUNTER — Ambulatory Visit (INDEPENDENT_AMBULATORY_CARE_PROVIDER_SITE_OTHER): Payer: Medicare HMO | Admitting: Urology

## 2019-11-24 VITALS — BP 128/84 | HR 71 | Temp 98.0°F | Ht 60.0 in

## 2019-11-24 DIAGNOSIS — N390 Urinary tract infection, site not specified: Secondary | ICD-10-CM

## 2019-11-25 ENCOUNTER — Other Ambulatory Visit: Payer: Self-pay | Admitting: Urology

## 2019-11-25 DIAGNOSIS — N3281 Overactive bladder: Secondary | ICD-10-CM

## 2019-11-25 LAB — URINALYSIS, COMPLETE
Bilirubin, UA: NEGATIVE
Glucose, UA: NEGATIVE
Ketones, UA: NEGATIVE
Leukocytes,UA: NEGATIVE
Nitrite, UA: POSITIVE — AB
Protein,UA: NEGATIVE
Specific Gravity, UA: 1.025 (ref 1.005–1.030)
Urobilinogen, Ur: 1 mg/dL (ref 0.2–1.0)
pH, UA: 6 (ref 5.0–7.5)

## 2019-11-25 LAB — MICROSCOPIC EXAMINATION

## 2019-12-01 ENCOUNTER — Telehealth: Payer: Self-pay | Admitting: Family Medicine

## 2019-12-01 LAB — CULTURE, URINE COMPREHENSIVE

## 2019-12-01 MED ORDER — SULFAMETHOXAZOLE-TRIMETHOPRIM 800-160 MG PO TABS: 1.0000 | ORAL_TABLET | Freq: Two times a day (BID) | ORAL | 0 refills | Status: DC

## 2019-12-01 NOTE — Telephone Encounter (Signed)
LMOM informed patient ABX have been sent to pharmacy and UCX was positive for infection.

## 2019-12-01 NOTE — Telephone Encounter (Signed)
-----   Message from Harle Battiest, PA-C sent at 12/01/2019 11:50 AM EDT ----- Please let Alice [name] know that her urine culture did return positive for infection.  I would like for her to start Septra DS, twice daily x7 days.  If she is able to start her antibiotic today, she can still have her cystoscopy tomorrow.

## 2019-12-02 ENCOUNTER — Other Ambulatory Visit: Payer: Self-pay

## 2019-12-02 ENCOUNTER — Ambulatory Visit (INDEPENDENT_AMBULATORY_CARE_PROVIDER_SITE_OTHER): Payer: Medicare HMO | Admitting: Urology

## 2019-12-02 ENCOUNTER — Encounter: Payer: Self-pay | Admitting: Urology

## 2019-12-02 VITALS — BP 116/75 | HR 76 | Ht 60.0 in | Wt 198.0 lb

## 2019-12-02 DIAGNOSIS — N3281 Overactive bladder: Secondary | ICD-10-CM

## 2019-12-02 DIAGNOSIS — N390 Urinary tract infection, site not specified: Secondary | ICD-10-CM | POA: Diagnosis not present

## 2019-12-02 MED ORDER — SULFAMETHOXAZOLE-TRIMETHOPRIM 400-80 MG PO TABS: 1.0000 | ORAL_TABLET | Freq: Every day | ORAL | 5 refills | Status: DC

## 2019-12-02 NOTE — Progress Notes (Signed)
   12/02/2019 1:20 PM   Alice [name] [birth year] [id number]  Reason for visit: Follow up recurrent UTI  HPI: I saw [name] today for recurrent UTI and OAB.  She was originally scheduled for a cystoscopy today, however had a recent positive urine culture for Klebsiella on 8/11, but never started the appropriate antibiotics that were sent to her pharmacy.  She remains on daily nitrofurantoin prophylaxis, but multiple recent positive cultures have been resistant to nitrofurantoin.  I discussed with her why we needed to cancel her procedure today in the setting of active infection and she understands.  Recent renal ultrasound in April 2021 showed no hydronephrosis, stones, or bladder lesions.  I counseled her on basic UTI prevention strategies including cranberry tablets, Premarin cream 3 times per week, adequate hydration, and avoiding constipation.  I encouraged her to pick up the Bactrim antibiotic prescribed by Michiel Cowboy for her acute UTI, and take this as prescribed.  I then recommended transitioning to single strength daily Bactrim for prophylaxis for the next 6 months to see if we can break the cycle of her UTIs.  RTC 6 months symptom check Consider cystoscopy in the future if recurrent UTIs despite above issues  Sondra Come, MD  [facility] [address] [place], [place] [address] [phone]

## 2020-02-17 ENCOUNTER — Encounter: Payer: Self-pay | Admitting: Urology

## 2020-02-17 ENCOUNTER — Ambulatory Visit (INDEPENDENT_AMBULATORY_CARE_PROVIDER_SITE_OTHER): Payer: Medicare HMO | Admitting: Urology

## 2020-02-17 ENCOUNTER — Other Ambulatory Visit: Payer: Self-pay

## 2020-02-17 VITALS — BP 110/72 | HR 81 | Ht 60.98 in | Wt 198.0 lb

## 2020-02-17 DIAGNOSIS — N390 Urinary tract infection, site not specified: Secondary | ICD-10-CM

## 2020-02-17 DIAGNOSIS — N321 Vesicointestinal fistula: Secondary | ICD-10-CM

## 2020-02-17 MED ORDER — CIPROFLOXACIN HCL 500 MG PO TABS: 500.0000 mg | ORAL_TABLET | Freq: Two times a day (BID) | ORAL | 0 refills | Status: AC

## 2020-02-17 NOTE — Patient Instructions (Signed)
Please call Radiology department at [phone] to schedule CT.

## 2020-02-17 NOTE — Progress Notes (Signed)
   02/17/2020 2:32 PM   Alice [name] Mar 06, 1941 [id number]  Reason for visit: Dysuria, recurrent UTI  HPI: I saw Alice [name] in urology clinic today for evaluation of possible UTI.  She is a 79 year old female who has had numerous UTIs over the last year including E. coli, Klebsiella, and multiple species who presents with similar UTI symptoms of dysuria, urgency/frequency, and headache.  Urinalysis today is grossly concerning for infection with 11-30 WBCs, 0-2 RBCs, many bacteria, nitrite positive, 1+ leukocytes.  She has had extensive work-up including a negative renal ultrasound in April 2021, and has been on topical estrogen cream and cranberry tablets daily, as well as Bactrim prophylaxis.  She also reports new pneumaturia over the last few days, which she has never had before.  She has a history of diverticulitis, and last cross-sectional imaging with CT in February 2019 showed extensive diverticulosis.  I personally reviewed that CT.  She was previously scheduled for a cystoscopy, but had an acute UTI at that visit and it was canceled.  We discussed my concerns for acute UTI today, and suspicion for possible colovesical fistula based on her history of diverticulitis, now pneumaturia, as well as recurrent UTIs over the last year despite numerous prevention strategies.  She denies any fevers or chills today, we discussed return precautions at length.  Cipro 500 mg twice daily x10 days, follow-up cultures CT abdomen pelvis with oral contrast to evaluate for colovesical fistula RTC 2 to 3 weeks with CT prior to review findings   Sondra Come, MD  [facility] [address] [place], [place] [address] [phone]

## 2020-02-18 LAB — URINALYSIS, COMPLETE
Bilirubin, UA: NEGATIVE
Glucose, UA: NEGATIVE
Ketones, UA: NEGATIVE
Nitrite, UA: POSITIVE — AB
Protein,UA: NEGATIVE
RBC, UA: NEGATIVE
Specific Gravity, UA: 1.02 (ref 1.005–1.030)
Urobilinogen, Ur: 0.2 mg/dL (ref 0.2–1.0)
pH, UA: 5.5 (ref 5.0–7.5)

## 2020-02-18 LAB — MICROSCOPIC EXAMINATION

## 2020-02-24 LAB — CULTURE, URINE COMPREHENSIVE

## 2020-03-06 ENCOUNTER — Other Ambulatory Visit: Payer: Self-pay

## 2020-03-06 ENCOUNTER — Ambulatory Visit
Admission: RE | Admit: 2020-03-06 | Discharge: 2020-03-06 | Disposition: A | Discharge status: Home / Self Care | Payer: Medicare HMO | Source: Ambulatory Visit | Attending: Urology | Admitting: Urology

## 2020-03-06 ENCOUNTER — Ambulatory Visit: Payer: Self-pay | Admitting: Urology

## 2020-03-06 DIAGNOSIS — N321 Vesicointestinal fistula: Secondary | ICD-10-CM | POA: Diagnosis not present

## 2020-03-06 DIAGNOSIS — N281 Cyst of kidney, acquired: Secondary | ICD-10-CM | POA: Diagnosis not present

## 2020-03-06 DIAGNOSIS — I700 Atherosclerosis of aorta: Secondary | ICD-10-CM | POA: Diagnosis not present

## 2020-03-06 DIAGNOSIS — N8110 Cystocele, unspecified: Secondary | ICD-10-CM | POA: Diagnosis not present

## 2020-03-06 DIAGNOSIS — N200 Calculus of kidney: Secondary | ICD-10-CM | POA: Diagnosis not present

## 2020-03-08 ENCOUNTER — Other Ambulatory Visit: Payer: Self-pay

## 2020-03-08 ENCOUNTER — Encounter: Payer: Self-pay | Admitting: Urology

## 2020-03-08 ENCOUNTER — Ambulatory Visit (INDEPENDENT_AMBULATORY_CARE_PROVIDER_SITE_OTHER): Payer: Medicare HMO | Admitting: Urology

## 2020-03-08 VITALS — BP 129/64 | HR 83 | Ht 60.0 in | Wt 198.0 lb

## 2020-03-08 DIAGNOSIS — N390 Urinary tract infection, site not specified: Secondary | ICD-10-CM

## 2020-03-08 DIAGNOSIS — N3281 Overactive bladder: Secondary | ICD-10-CM | POA: Diagnosis not present

## 2020-03-08 MED ORDER — METHENAMINE HIPPURATE 1 G PO TABS: 1.0000 g | ORAL_TABLET | Freq: Two times a day (BID) | ORAL | 6 refills | Status: DC

## 2020-03-08 NOTE — Progress Notes (Signed)
   03/08/2020 5:14 PM   Belenda Cruise Jul 11, 1940 [id number]  Reason for visit: Follow up recurrent UTIs, review CT results  HPI: I saw Alice [name] in urology clinic for follow-up of recurrent UTIs and to review CT results.  She has had numerous UTIs over the last year including E. coli, Klebsiella, and multiple species who presented on 02/17/2020 with similar UTI symptoms of dysuria, urgency/frequency, and headache.  Urinalysis was grossly infected at that time and culture grew Klebsiella.  She was also having pneumaturia at that time, and with her history of diverticulitis I ordered a CT abdomen pelvis with oral contrast to evaluate for a colovesical fistula.  She reports that her pneumaturia and urinary symptoms resolved after treatment with antibiotics.  I personally viewed and interpreted her CT dated 03/06/2020.  No hydronephrosis or significant nephrolithiasis.  There is no obvious evidence of a colovesical fistula, however there is no enteric contrast material identified within the distal large bowel.  There is no gas in the bladder.  I recommended continuing topical estrogen cream, cranberry tablets, continuing low-dose antibiotic prophylaxis, as well as adding methanamine 1g BID for UTI prevention in the setting of her recurrent UTIs.  RTC 6 months for symptom check If recurrent UTIs despite above strategies, recommend cystoscopy   Sondra Come, MD  [facility] [address] [place], [place] [address] [phone]

## 2020-03-14 LAB — URINALYSIS, COMPLETE
Bilirubin, UA: NEGATIVE
Glucose, UA: NEGATIVE
Ketones, UA: NEGATIVE
Nitrite, UA: NEGATIVE
Protein,UA: NEGATIVE
Specific Gravity, UA: >1.03 — ABNORMAL HIGH (ref 1.005–1.030)
Urobilinogen, Ur: 0.2 mg/dL (ref 0.2–1.0)
pH, UA: 5.5 (ref 5.0–7.5)

## 2020-03-14 LAB — MICROSCOPIC EXAMINATION

## 2020-03-29 ENCOUNTER — Other Ambulatory Visit: Payer: Self-pay | Admitting: Physician Assistant

## 2020-03-30 ENCOUNTER — Other Ambulatory Visit: Payer: Self-pay | Admitting: Physician Assistant

## 2020-03-30 DIAGNOSIS — R1032 Left lower quadrant pain: Secondary | ICD-10-CM

## 2020-03-31 ENCOUNTER — Ambulatory Visit: Payer: Medicare HMO

## 2020-04-10 ENCOUNTER — Other Ambulatory Visit: Payer: Self-pay

## 2020-04-10 ENCOUNTER — Ambulatory Visit
Admission: RE | Admit: 2020-04-10 | Discharge: 2020-04-10 | Disposition: A | Discharge status: Home / Self Care | Payer: Medicare HMO | Source: Ambulatory Visit | Attending: Physician Assistant | Admitting: Physician Assistant

## 2020-04-10 DIAGNOSIS — R1032 Left lower quadrant pain: Secondary | ICD-10-CM | POA: Diagnosis present

## 2020-04-10 MED ORDER — IOHEXOL 300 MG/ML  SOLN
100.0000 mL | Freq: Once | INTRAMUSCULAR | Status: AC | PRN
  Administered 2020-04-10: 16:00:00 100 mL via INTRAVENOUS

## 2020-07-27 ENCOUNTER — Other Ambulatory Visit: Payer: Self-pay | Admitting: Family Medicine

## 2020-07-27 ENCOUNTER — Other Ambulatory Visit: Payer: Self-pay

## 2020-07-27 ENCOUNTER — Ambulatory Visit
Admission: RE | Admit: 2020-07-27 | Discharge: 2020-07-27 | Disposition: A | Discharge status: Home / Self Care | Payer: Medicare HMO | Source: Ambulatory Visit | Attending: Family Medicine | Admitting: Family Medicine

## 2020-07-27 DIAGNOSIS — R1032 Left lower quadrant pain: Secondary | ICD-10-CM

## 2020-07-27 LAB — POCT I-STAT CREATININE: Creatinine, Ser: 0.7 mg/dL (ref 0.44–1.00)

## 2020-07-27 MED ORDER — IOHEXOL 300 MG/ML  SOLN
100.0000 mL | Freq: Once | INTRAMUSCULAR | Status: AC | PRN
  Administered 2020-07-27: 100 mL via INTRAVENOUS

## 2020-08-04 ENCOUNTER — Other Ambulatory Visit: Payer: Self-pay | Admitting: Physician Assistant

## 2020-08-04 ENCOUNTER — Other Ambulatory Visit (HOSPITAL_COMMUNITY): Payer: Self-pay | Admitting: Physician Assistant

## 2020-08-04 DIAGNOSIS — E041 Nontoxic single thyroid nodule: Secondary | ICD-10-CM

## 2020-08-11 ENCOUNTER — Ambulatory Visit: Payer: Medicare HMO

## 2020-09-07 ENCOUNTER — Ambulatory Visit: Payer: Self-pay | Admitting: Urology

## 2020-09-08 ENCOUNTER — Encounter: Payer: Self-pay | Admitting: Urology

## 2020-10-25 ENCOUNTER — Other Ambulatory Visit: Payer: Self-pay

## 2020-10-25 ENCOUNTER — Observation Stay
Admission: EM | Admit: 2020-10-25 | Discharge: 2020-10-26 | Disposition: A | Discharge status: Home / Self Care | Payer: Medicare HMO | Attending: Family Medicine | Admitting: Family Medicine

## 2020-10-25 ENCOUNTER — Emergency Department: Payer: Medicare HMO

## 2020-10-25 DIAGNOSIS — Z20822 Contact with and (suspected) exposure to covid-19: Secondary | ICD-10-CM | POA: Insufficient documentation

## 2020-10-25 DIAGNOSIS — M797 Fibromyalgia: Secondary | ICD-10-CM | POA: Diagnosis present

## 2020-10-25 DIAGNOSIS — R7989 Other specified abnormal findings of blood chemistry: Secondary | ICD-10-CM

## 2020-10-25 DIAGNOSIS — R0789 Other chest pain: Secondary | ICD-10-CM | POA: Diagnosis not present

## 2020-10-25 DIAGNOSIS — Z87891 Personal history of nicotine dependence: Secondary | ICD-10-CM | POA: Diagnosis not present

## 2020-10-25 DIAGNOSIS — Z79899 Other long term (current) drug therapy: Secondary | ICD-10-CM | POA: Insufficient documentation

## 2020-10-25 DIAGNOSIS — R778 Other specified abnormalities of plasma proteins: Secondary | ICD-10-CM | POA: Diagnosis not present

## 2020-10-25 DIAGNOSIS — E039 Hypothyroidism, unspecified: Secondary | ICD-10-CM | POA: Diagnosis not present

## 2020-10-25 DIAGNOSIS — N1831 Chronic kidney disease, stage 3a: Secondary | ICD-10-CM | POA: Diagnosis not present

## 2020-10-25 DIAGNOSIS — R079 Chest pain, unspecified: Secondary | ICD-10-CM | POA: Diagnosis present

## 2020-10-25 DIAGNOSIS — R300 Dysuria: Secondary | ICD-10-CM | POA: Diagnosis not present

## 2020-10-25 DIAGNOSIS — Z91010 Allergy to peanuts: Secondary | ICD-10-CM | POA: Diagnosis not present

## 2020-10-25 DIAGNOSIS — Z96651 Presence of right artificial knee joint: Secondary | ICD-10-CM | POA: Diagnosis not present

## 2020-10-25 DIAGNOSIS — R410 Disorientation, unspecified: Secondary | ICD-10-CM | POA: Diagnosis present

## 2020-10-25 DIAGNOSIS — R4182 Altered mental status, unspecified: Secondary | ICD-10-CM | POA: Diagnosis not present

## 2020-10-25 DIAGNOSIS — I161 Hypertensive emergency: Secondary | ICD-10-CM

## 2020-10-25 DIAGNOSIS — I129 Hypertensive chronic kidney disease with stage 1 through stage 4 chronic kidney disease, or unspecified chronic kidney disease: Secondary | ICD-10-CM | POA: Insufficient documentation

## 2020-10-25 DIAGNOSIS — I10 Essential (primary) hypertension: Secondary | ICD-10-CM | POA: Diagnosis present

## 2020-10-25 LAB — CBC WITH DIFFERENTIAL/PLATELET
Abs Immature Granulocytes: 0.03 10*3/uL (ref 0.00–0.07)
Basophils Absolute: 0 10*3/uL (ref 0.0–0.1)
Basophils Relative: 0 %
Eosinophils Absolute: 0.2 10*3/uL (ref 0.0–0.5)
Eosinophils Relative: 2 %
HCT: 44 % (ref 36.0–46.0)
Hemoglobin: 15.7 g/dL — ABNORMAL HIGH (ref 12.0–15.0)
Immature Granulocytes: 0 %
Lymphocytes Relative: 18 %
Lymphs Abs: 1.6 10*3/uL (ref 0.7–4.0)
MCH: 32.7 pg (ref 26.0–34.0)
MCHC: 35.7 g/dL (ref 30.0–36.0)
MCV: 91.7 fL (ref 80.0–100.0)
Monocytes Absolute: 0.5 10*3/uL (ref 0.1–1.0)
Monocytes Relative: 5 %
Neutro Abs: 6.8 10*3/uL (ref 1.7–7.7)
Neutrophils Relative %: 75 %
Platelets: 189 10*3/uL (ref 150–400)
RBC: 4.8 MIL/uL (ref 3.87–5.11)
RDW: 12 % (ref 11.5–15.5)
WBC: 9.1 10*3/uL (ref 4.0–10.5)
nRBC: 0 % (ref 0.0–0.2)

## 2020-10-25 LAB — URINALYSIS, COMPLETE (UACMP) WITH MICROSCOPIC
Bilirubin Urine: NEGATIVE
Glucose, UA: 50 mg/dL — AB
Hgb urine dipstick: NEGATIVE
Ketones, ur: NEGATIVE mg/dL
Nitrite: NEGATIVE
Protein, ur: NEGATIVE mg/dL
Specific Gravity, Urine: 1.008 (ref 1.005–1.030)
pH: 8 (ref 5.0–8.0)

## 2020-10-25 LAB — COMPREHENSIVE METABOLIC PANEL
ALT: 19 U/L (ref 0–44)
AST: 25 U/L (ref 15–41)
Albumin: 4.2 g/dL (ref 3.5–5.0)
Alkaline Phosphatase: 69 U/L (ref 38–126)
Anion gap: 10 (ref 5–15)
BUN: 12 mg/dL (ref 8–23)
CO2: 24 mmol/L (ref 22–32)
Calcium: 9.6 mg/dL (ref 8.9–10.3)
Chloride: 105 mmol/L (ref 98–111)
Creatinine, Ser: 0.6 mg/dL (ref 0.44–1.00)
GFR, Estimated: >60 mL/min (ref >60)
Glucose, Bld: 175 mg/dL — ABNORMAL HIGH (ref 70–99)
Potassium: 3.3 mmol/L — ABNORMAL LOW (ref 3.5–5.1)
Sodium: 139 mmol/L (ref 135–145)
Total Bilirubin: 1.2 mg/dL (ref 0.3–1.2)
Total Protein: 7.2 g/dL (ref 6.5–8.1)

## 2020-10-25 LAB — AMMONIA: Ammonia: 29 umol/L (ref 9–35)

## 2020-10-25 LAB — TROPONIN I (HIGH SENSITIVITY)
Troponin I (High Sensitivity): 29 ng/L — ABNORMAL HIGH (ref <18)
Troponin I (High Sensitivity): 64 ng/L — ABNORMAL HIGH (ref <18)

## 2020-10-25 LAB — RESP PANEL BY RT-PCR (FLU A&B, COVID) ARPGX2
Influenza A by PCR: NEGATIVE
Influenza B by PCR: NEGATIVE
SARS Coronavirus 2 by RT PCR: NEGATIVE

## 2020-10-25 LAB — MAGNESIUM: Magnesium: 1.9 mg/dL (ref 1.7–2.4)

## 2020-10-25 LAB — TSH: TSH: 0.177 u[IU]/mL — ABNORMAL LOW (ref 0.350–4.500)

## 2020-10-25 MED ORDER — ONDANSETRON HCL 4 MG PO TABS
4.0000 mg | ORAL_TABLET | Freq: Four times a day (QID) | ORAL | Status: DC | PRN
Start: 2020-10-25 — End: 2020-10-26

## 2020-10-25 MED ORDER — FLUTICASONE PROPIONATE 50 MCG/ACT NA SUSP
2.0000 | Freq: Every day | NASAL | Status: DC
  Administered 2020-10-26: 2 via NASAL
  Filled 2020-10-25: qty 16

## 2020-10-25 MED ORDER — MAGNESIUM SULFATE 2 GM/50ML IV SOLN
2.0000 g | Freq: Once | INTRAVENOUS | Status: AC
  Administered 2020-10-25: 2 g via INTRAVENOUS
  Filled 2020-10-25: qty 50

## 2020-10-25 MED ORDER — OXYBUTYNIN CHLORIDE ER 10 MG PO TB24
10.0000 mg | ORAL_TABLET | Freq: Every day | ORAL | Status: DC
  Administered 2020-10-26: 10 mg via ORAL
  Filled 2020-10-25: qty 1

## 2020-10-25 MED ORDER — ACETAMINOPHEN 325 MG PO TABS
650.0000 mg | ORAL_TABLET | Freq: Four times a day (QID) | ORAL | Status: DC | PRN
  Filled 2020-10-25: qty 2

## 2020-10-25 MED ORDER — ASPIRIN EC 325 MG PO TBEC
325.0000 mg | DELAYED_RELEASE_TABLET | Freq: Every day | ORAL | Status: DC
  Administered 2020-10-26: 325 mg via ORAL
  Filled 2020-10-25: qty 1

## 2020-10-25 MED ORDER — ASPIRIN 81 MG PO CHEW
324.0000 mg | CHEWABLE_TABLET | Freq: Once | ORAL | Status: AC
  Administered 2020-10-25: 324 mg via ORAL
  Filled 2020-10-25: qty 4

## 2020-10-25 MED ORDER — LACTATED RINGERS IV BOLUS
500.0000 mL | Freq: Once | INTRAVENOUS | Status: AC
  Administered 2020-10-25: 500 mL via INTRAVENOUS

## 2020-10-25 MED ORDER — PAROXETINE HCL 20 MG PO TABS
40.0000 mg | ORAL_TABLET | Freq: Every day | ORAL | Status: DC
  Administered 2020-10-26: 40 mg via ORAL
  Filled 2020-10-25: qty 2

## 2020-10-25 MED ORDER — LABETALOL HCL 5 MG/ML IV SOLN
INTRAVENOUS | Status: AC
  Administered 2020-10-25: 10 mg via INTRAVENOUS
  Filled 2020-10-25: qty 4

## 2020-10-25 MED ORDER — LABETALOL HCL 5 MG/ML IV SOLN: 10.0000 mg | Freq: Once | INTRAVENOUS | Status: AC

## 2020-10-25 MED ORDER — IOHEXOL 350 MG/ML SOLN
100.0000 mL | Freq: Once | INTRAVENOUS | Status: AC | PRN
  Administered 2020-10-25: 100 mL via INTRAVENOUS

## 2020-10-25 MED ORDER — ONDANSETRON HCL 4 MG/2ML IJ SOLN: 4.0000 mg | Freq: Four times a day (QID) | INTRAMUSCULAR | Status: DC | PRN

## 2020-10-25 MED ORDER — ENOXAPARIN SODIUM 60 MG/0.6ML IJ SOSY: 0.5000 mg/kg | PREFILLED_SYRINGE | INTRAMUSCULAR | Status: DC

## 2020-10-25 MED ORDER — FAMOTIDINE 20 MG PO TABS: 40.0000 mg | ORAL_TABLET | Freq: Every day | ORAL | Status: DC

## 2020-10-25 MED ORDER — SODIUM CHLORIDE 0.9 % IV SOLN: INTRAVENOUS | Status: DC

## 2020-10-25 MED ORDER — PROCHLORPERAZINE EDISYLATE 10 MG/2ML IJ SOLN
10.0000 mg | Freq: Once | INTRAMUSCULAR | Status: AC
  Administered 2020-10-25: 10 mg via INTRAVENOUS
  Filled 2020-10-25: qty 2

## 2020-10-25 MED ORDER — MAGNESIUM HYDROXIDE 400 MG/5ML PO SUSP: 30.0000 mL | Freq: Every day | ORAL | Status: DC | PRN

## 2020-10-25 MED ORDER — LOSARTAN POTASSIUM 50 MG PO TABS
25.0000 mg | ORAL_TABLET | Freq: Every day | ORAL | Status: DC
  Administered 2020-10-25 – 2020-10-26 (×2): 25 mg via ORAL
  Filled 2020-10-25 (×2): qty 1

## 2020-10-25 MED ORDER — METHENAMINE HIPPURATE 1 G PO TABS: 1.0000 g | ORAL_TABLET | Freq: Two times a day (BID) | ORAL | Status: DC

## 2020-10-25 MED ORDER — POTASSIUM CHLORIDE CRYS ER 20 MEQ PO TBCR
40.0000 meq | EXTENDED_RELEASE_TABLET | Freq: Once | ORAL | Status: AC
  Administered 2020-10-25: 40 meq via ORAL
  Filled 2020-10-25: qty 2

## 2020-10-25 MED ORDER — ACETAMINOPHEN 650 MG RE SUPP: 650.0000 mg | Freq: Four times a day (QID) | RECTAL | Status: DC | PRN

## 2020-10-25 MED ORDER — TRAZODONE HCL 50 MG PO TABS: 25.0000 mg | ORAL_TABLET | Freq: Every evening | ORAL | Status: DC | PRN

## 2020-10-25 MED ORDER — SIMVASTATIN 10 MG PO TABS: 20.0000 mg | ORAL_TABLET | Freq: Two times a day (BID) | ORAL | Status: DC

## 2020-10-25 NOTE — ED Triage Notes (Signed)
Pt presents via EMS from home for c/o AMS  X 1 hr ago.  Per EMS report, pt's husband called EMS. Pt c/o headache and dysuria.  Pt poor historian for HPI, sts she does not know what is wrong with her. Pt is A&Ox4 at this time.  Grip strength equal bilaterally, no facial droop, sensory intact, ambulated with stand by assist to toilet without ataxia.   Pt reports photosensitivity and nausea at this time.

## 2020-10-25 NOTE — ED Provider Notes (Signed)
[facility] Emergency Department Provider Note  ____________________________________________   Event Date/Time   First MD Initiated Contact with Patient 10/25/20 1942     (approximate)  I have reviewed the triage vital signs and the nursing notes.   HISTORY  Chief Complaint Altered Mental Status   HPI Alice [name] is a 80 y.o. female past medical history anxiety, throat is, depression, fibromyalgia, GERD, HTN, hypothyroidism, HDL, migraines and obesity as well as polymyalgia rheumatica reportedly oriented at baseline x4 who presents for assessment via EMS from home for some confusion.  Per EMS to start about an hour prior to arrival although patient states she is not sure when she started feeling bad.  She is endorsing a headache as well as some chest pain and think she has had some burning with urination.  She denies any recent injuries or falls cough shortness of breath although is not sure if she has had any other sick symptoms because she states she is little confused and on arrival she is not oriented or any opioid any additional history.         Past Medical History:  Diagnosis Date   Anxiety    Arthritis    Depression    Fibromyalgia    GERD (gastroesophageal reflux disease)    History of carpal tunnel syndrome    History of diverticulitis    History of dysphagia    History of vertigo    Hypercholesteremia    Hypertension    Hypothyroidism    Migraine    Neuralgia 2014   Obesity    Polymyalgia rheumatica (HCC)     Patient Active Problem List   Diagnosis Date Noted   Chest pain 10/25/2020   Stage 3a chronic kidney disease (HCC) 10/29/2019   Lumbar radiculopathy 07/08/2019   Abnormal gait 06/17/2018   Displacement of lumbar intervertebral disc without myelopathy 06/17/2018   Impingement syndrome of shoulder region 06/17/2018   Knee stiff 06/17/2018   Mild cognitive impairment 07/03/2017   Generalized abdominal pain 05/08/2017    Hx of adenomatous colonic polyps 05/08/2017   Increased frequency of urination 05/08/2017   Urinary and fecal incontinence 05/08/2017   Chronic pain syndrome 07/09/2016   Musculoskeletal pain 07/09/2016   Severe obesity (BMI 35.0-39.9) with comorbidity (HCC) 12/04/2015   Chronic knee pain (S/P TKR) (Right) 12/04/2015   Chronic knee pain (Left) 12/04/2015   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) (Right) 12/04/2015   Osteoarthritis of knee (Left) 12/04/2015   Carpal tunnel syndrome 11/23/2015   Depression 11/23/2015   Fibromyalgia 11/23/2015   GERD (gastroesophageal reflux disease) 11/23/2015   History of dysphagia 11/23/2015   Hypothyroidism 11/23/2015   Hyperlipidemia 11/23/2015   Hypertension 11/23/2015   Migraine headache 11/23/2015   Osteoarthritis (arthritis due to wear and tear of joints) 11/23/2015   Chronic low back pain (Location of Tertiary source of pain) (Bilateral) (L>R) 11/23/2015   Chronic lower extremity pain (Bilateral) (L>R) 11/23/2015   Chronic knee pain (Location of Primary Source of Pain) (Bilateral) (L>R) 11/23/2015   Chronic hip pain (Location of Secondary source of pain) (Bilateral) (L>R) 11/23/2015   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) 11/23/2015   Lumbar facet syndrome (Bilateral) (L>R) 11/23/2015   Chronic sacroiliac joint pain (Bilateral) (L>R) 11/23/2015   Long term current use of opiate analgesic 11/23/2015   Long term prescription opiate use 11/23/2015   Opiate use 11/23/2015   Long term current use of non-steroidal anti-inflammatories (NSAID) 11/23/2015   Disturbance  of skin sensation 11/23/2015   Diverticulosis 11/23/2015   Neurogenic pain 11/23/2015   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) 11/23/2015   Encounter for pain management planning 11/23/2015   Encounter for therapeutic drug level monitoring 11/23/2015   Chronic neck pain 11/23/2015   Lumbar spondylosis 11/23/2015   Cervical spondylosis 11/23/2015    History of TKR (total knee replacement) (Right) 11/23/2015   Anxiety 08/25/2013   Neuralgia 01/25/2013   No transfusions per religious beliefs 02/27/2012    Past Surgical History:  Procedure Laterality Date   ABDOMINAL HYSTERECTOMY     BREAST CYST ASPIRATION Right +/- 10 yrs ago   x 2 neg   CHOLECYSTECTOMY     COLON SURGERY     COLONOSCOPY  09/08/2006   COLONOSCOPY WITH PROPOFOL N/A 07/16/2017   Procedure: COLONOSCOPY WITH PROPOFOL;  Surgeon: Scot Jun, MD;  Location: [facility] ENDOSCOPY;  Service: Endoscopy;  Laterality: N/A;   ESOPHAGOGASTRODUODENOSCOPY  01/24/2009   HERNIA REPAIR     JOINT REPLACEMENT Right    knee 2015   THYROIDECTOMY      Prior to Admission medications   Medication Sig Start Date End Date Taking? Authorizing Provider  [facility] D3 5000 units capsule TAKE 5,000 UNITS BY MOUTH ONCE DAILY. 10/10/15   [provider]  estradiol (ESTRACE VAGINAL) 0.1 MG/GM vaginal cream Apply 0.5mg  (pea-sized amount)  just inside the vaginal introitus with a finger-tip on Monday, Wednesday and Friday nights. 02/22/19   Michiel Cowboy A, PA-C  famotidine (PEPCID) 40 MG tablet Take 40 mg by mouth at bedtime. 04/19/19   [provider]  fluticasone (FLONASE) 50 MCG/ACT nasal spray Place into the nose. 10/28/19 10/27/20  [provider]  gabapentin (NEURONTIN) 400 MG capsule Take 400 mg by mouth 3 (three) times daily.  09/23/15   [provider]  losartan (COZAAR) 25 MG tablet Take 25 mg by mouth daily. 04/29/19   [provider]  methenamine (HIPREX) 1 g tablet Take 1 tablet (1 g total) by mouth in the morning and at bedtime. 03/08/20   Sondra Come, MD  oxybutynin (DITROPAN-XL) 10 MG 24 hr tablet TAKE 1 TABLET BY MOUTH EVERY DAY 11/25/19   McGowan, Carollee Herter A, PA-C  PARoxetine (PAXIL) 40 MG tablet Take 40 mg by mouth daily. 09/11/15   [provider]  simvastatin (ZOCOR) 20 MG tablet Take by mouth 2 (two) times daily.  05/09/15   [provider]    Allergies Lac bovis, Milk-related compounds, Peanut oil, Peanut-containing drug products, and Demerol [meperidine]  Family History  Problem Relation Age of Onset   Arthritis Mother    Arthritis Father    COPD Father    Arthritis Sister    Diabetes Sister    Cancer Brother    COPD Brother    Cancer Son    Drug abuse Son    Stroke Maternal Grandmother    Arthritis Paternal Grandfather    Breast cancer Neg Hx     Social History Social History   Tobacco Use   Smoking status: Former    Types: Cigarettes    Quit date: 02/26/1982    Years since quitting: 38.6   Smokeless tobacco: Never  Vaping Use   Vaping Use: Never used  Substance Use Topics   Alcohol use: No   Drug use: No    Review of Systems  Review of Systems  Unable to perform ROS: Mental status change  Cardiovascular:  Positive for chest pain.  Neurological:  Positive for  headaches.     ____________________________________________   PHYSICAL EXAM:  VITAL SIGNS: ED Triage Vitals [10/25/20 1941]  Enc Vitals Group     BP      Pulse Rate (!) 102     Resp 16     Temp 98.6 F (37 C)     Temp Source Oral     SpO2 97 %     Weight 193 lb 14.4 oz (88 kg)     Height 5\' 3"  (1.6 m)     Head Circumference      Peak Flow      Pain Score      Pain Loc      Pain Edu?      Excl. in GC?    Vitals:   10/25/20 2221 10/25/20 2300  BP: (!) 189/101 (!) 179/104  Pulse: 97 88  Resp: 20 18  Temp:    SpO2: 93% 93%   Physical Exam Vitals and nursing note reviewed.  Constitutional:      General: She is not in acute distress.    Appearance: She is well-developed.  HENT:     Head: Normocephalic and atraumatic.     Right Ear: External ear normal.     Left Ear: External ear normal.     Nose: Nose normal.  Eyes:     Conjunctiva/sclera: Conjunctivae normal.  Cardiovascular:     Rate and Rhythm: Normal rate and regular rhythm.     Heart sounds: No murmur heard. Pulmonary:     Effort:  Pulmonary effort is normal. No respiratory distress.     Breath sounds: Normal breath sounds.  Abdominal:     Palpations: Abdomen is soft.     Tenderness: There is no abdominal tenderness.  Musculoskeletal:     Cervical back: Neck supple.  Skin:    General: Skin is warm and dry.  Neurological:     General: No focal deficit present.     Mental Status: She is alert. She is disoriented and confused.    Cranial nerves II through XII grossly intact.  No pronator drift.  No finger dysmetria.  Symmetric 5/5 strength of all extremities.  Sensation intact to light touch in all extremities.  Unremarkable unassisted gait.  On arrival patient is not oriented to date only year.  States she is not sure why she is in emergency room. ____________________________________________   LABS (all labs ordered are listed, but only abnormal results are displayed)  Labs Reviewed  CBC WITH DIFFERENTIAL/PLATELET - Abnormal; Notable for the following components:      Result Value   Hemoglobin 15.7 (*)    All other components within normal limits  COMPREHENSIVE METABOLIC PANEL - Abnormal; Notable for the following components:   Potassium 3.3 (*)    Glucose, Bld 175 (*)    All other components within normal limits  URINALYSIS, COMPLETE (UACMP) WITH MICROSCOPIC - Abnormal; Notable for the following components:   Color, Urine STRAW (*)    APPearance CLEAR (*)    Glucose, UA 50 (*)    Leukocytes,Ua TRACE (*)    Bacteria, UA RARE (*)    All other components within normal limits  TSH - Abnormal; Notable for the following components:   TSH 0.177 (*)    All other components within normal limits  TROPONIN I (HIGH SENSITIVITY) - Abnormal; Notable for the following components:   Troponin I (High Sensitivity) 29 (*)    All other components within normal limits  TROPONIN I (HIGH SENSITIVITY) -  Abnormal; Notable for the following components:   Troponin I (High Sensitivity) 64 (*)    All other components within  normal limits  RESP PANEL BY RT-PCR (FLU A&B, COVID) ARPGX2  URINE CULTURE  AMMONIA  MAGNESIUM   ____________________________________________  EKG  Sinus rhythm with a ventricular of 96, some artifact in V1 without evidence of clear acute ischemia or significant arrhythmia. ____________________________________________  RADIOLOGY  ED MD interpretation: CT head shows evidence of mild atrophic and chronic white matter ischemic changes without evidence of CVA or intracranial hemorrhage.  CT a chest abdomen pelvis shows no evidence of dissection, PE, pneumonia, pneumothorax, abdominal AAA, kidney stone, cholecystitis, pancreatitis or other clear acute abdominal pelvic or thoracic process.   Official radiology report(s): CT Head Wo Contrast  Result Date: 10/25/2020 CLINICAL DATA:  Headaches and mental status EXAM: CT HEAD WITHOUT CONTRAST TECHNIQUE: Contiguous axial images were obtained from the base of the skull through the vertex without intravenous contrast. COMPARISON:  12/18/2008 FINDINGS: Brain: No evidence of acute infarction, hemorrhage, hydrocephalus, extra-axial collection or mass lesion/mass effect. Mild atrophic and chronic white matter ischemic changes are noted. Vascular: No hyperdense vessel or unexpected calcification. Skull: Normal. Negative for fracture or focal lesion. Sinuses/Orbits: No acute finding. Other: None. IMPRESSION: Mild atrophic and chronic white matter ischemic changes without acute abnormality. Electronically Signed   By: Alcide Clever M.D.   On: 10/25/2020 21:11   CT Angio Chest/Abd/Pel for Dissection W and/or Wo Contrast  Result Date: 10/25/2020 CLINICAL DATA:  Chest and abdominal pain EXAM: CT ANGIOGRAPHY CHEST, ABDOMEN AND PELVIS TECHNIQUE: Non-contrast CT of the chest was initially obtained. Multidetector CT imaging through the chest, abdomen and pelvis was performed using the standard protocol during bolus administration of intravenous contrast. Multiplanar  reconstructed images and MIPs were obtained and reviewed to evaluate the vascular anatomy. CONTRAST:  OMNIPAQUE IOHEXOL 350 MG/ML SOLN COMPARISON:  07/27/2020 FINDINGS: CTA CHEST FINDINGS Cardiovascular: Initial precontrast images demonstrate atherosclerotic calcifications of the thoracic aorta. Post-contrast images demonstrate no aneurysmal dilatation or dissection. No cardiac enlargement is seen. No pericardial effusion is noted. No significant coronary calcifications are noted. The visualized pulmonary artery shows no filling defect to suggest pulmonary embolism. Mediastinum/Nodes: Changes of prior left thyroidectomy are noted. Scattered calcifications and hypodensities are noted without definitive discrete nodule. There is intrathoracic extension. No sizable hilar or mediastinal adenopathy is noted. Esophagus as visualized is within normal limits. Lungs/Pleura: Scarring is again seen in the right lower lobe stable in appearance from a prior exam from 2022 as well as 2021. No focal infiltrate or sizable effusion is seen. No parenchymal nodules are seen. Musculoskeletal: Degenerative changes of the thoracic spine are noted. No rib abnormality is seen. Review of the MIP images confirms the above findings. CTA ABDOMEN AND PELVIS FINDINGS VASCULAR Aorta: Atherosclerotic calcifications are noted without aneurysmal dilatation or dissection. Celiac: Patent without evidence of aneurysm, dissection, vasculitis or significant stenosis. SMA: Patent without evidence of aneurysm, dissection, vasculitis or significant stenosis. Renals: Both renal arteries are patent without evidence of aneurysm, dissection, vasculitis, fibromuscular dysplasia or significant stenosis. IMA: Patent without evidence of aneurysm, dissection, vasculitis or significant stenosis. Inflow: Iliac arteries demonstrate atherosclerotic calcifications without aneurysmal dilatation. Veins: No specific venous abnormality is seen. Review of the MIP images  confirms the above findings. NON-VASCULAR Hepatobiliary: Fatty infiltration of the liver is noted. Gallbladder has been surgically removed. Pancreas: Pancreas is within normal limits. Spleen: Normal in size without focal abnormality. Adrenals/Urinary Tract: Adrenal glands are unremarkable. Renal cystic changes  are seen bilaterally. No renal calculi or obstructive changes are noted. Renal cysts are stable from the prior exam. The bladder is well distended. Stomach/Bowel: Scattered diverticular changes noted without evidence of diverticulitis. No obstructive or inflammatory changes are seen. The appendix is within normal limits. No acute small bowel or gastric abnormality is seen. Duodenal diverticulum is noted adjacent to the head of the pancreas. Lymphatic: No significant lymphadenopathy is seen. Reproductive: Status post hysterectomy. No adnexal masses. Other: No abdominal wall hernia or abnormality. No abdominopelvic ascites. Musculoskeletal: Degenerative changes of lumbar spine are noted. No acute abnormality is seen. Review of the MIP images confirms the above findings. IMPRESSION: CTA of the chest: No evidence of aortic dissection or pulmonary embolism. Changes of prior left thyroidectomy. The residual right thyroid demonstrates scattered calcifications and hypodensities without discrete nodule. It is enlarged and stable in appearance from prior thyroid ultrasound from 07/09/2019. Recommend nonemergent follow-up thyroid US as previously recommended(ref: J Am Coll Radiol. 2015 Feb;12(2): 143-50). CTA of the abdomen and pelvis: No aneurysmal dilatation or dissection is noted. Fatty infiltration of the liver. Diverticulosis without diverticulitis. Electronically Signed   By: Alcide CleverMark  Lukens M.D.   On: 10/25/2020 21:30    ____________________________________________   PROCEDURES  Procedure(s) performed (including Critical Care):  .1-3 Lead EKG Interpretation  Date/Time: 10/25/2020 11:54 PM Performed by:  Gilles ChiquitoSmith, Dontreal Miera P, MD Authorized by: Gilles ChiquitoSmith, Kimberleigh Mehan P, MD     Interpretation: normal     ECG rate assessment: normal     Rhythm: sinus rhythm     Ectopy: none     Conduction: normal   .Critical Care  Date/Time: 10/25/2020 11:54 PM Performed by: Gilles ChiquitoSmith, Washington Whedbee P, MD Authorized by: Gilles ChiquitoSmith, Aloysuis Ribaudo P, MD   Critical care provider statement:    Critical care time (minutes):  45   Critical care was necessary to treat or prevent imminent or life-threatening deterioration of the following conditions:  CNS failure or compromise and cardiac failure   Critical care was time spent personally by me on the following activities:  Discussions with consultants, evaluation of patient's response to treatment, examination of patient, ordering and performing treatments and interventions, ordering and review of laboratory studies, ordering and review of radiographic studies, pulse oximetry, re-evaluation of patient's condition, obtaining history from patient or surrogate and review of old charts   ____________________________________________   INITIAL IMPRESSION / ASSESSMENT AND PLAN / ED COURSE     Patient presents with above-stated history exam for assessment of altered mental status after family called EMS with patient reporting headache and some chest pain as well as abdominal pain with chest pain rating to the back.  On arrival she is hypertensive with a BP of 200/116 with otherwise stable vital signs on room air.  Does have a nonfocal neuro exam and has no history or exam features to suggest recent trauma and I have a low suspicion for toxic ingestion.  Primary differential includes SAH, press, CVA, dissection, ACS, PE, arrhythmia, anemia, metabolic derangements and acute infectious process.  CT head shows evidence of mild atrophic and chronic white matter ischemic changes without evidence of CVA or intracranial hemorrhage.  CT a chest abdomen pelvis shows no evidence of dissection, PE, pneumonia,  pneumothorax, abdominal AAA, kidney stone, cholecystitis, pancreatitis or other clear acute abdominal pelvic or thoracic process.   CBC shows no leukocytosis and hemoglobin of 15.7.  CMP shows a K of 3.3, glucose of 175 without any other significant electrode or metabolic derangements.  UA shows trace leukocyte esterase  and rare bacteria without other evidence of infection.  Lower suspicion for acute cystitis at this time given absence of PVCs or nitrites.  TSH, ammonia and magnesium are unremarkable.  COVID and flu test is negative.  While ECG is reassuring troponin did uptrend from 29-64 which I suspect represents some mild demand ischemia in the setting of very highly elevated pressures.  On her arrival patient was treated with labetalol and migraine cocktail noted below.  He subsequently had improvement of blood pressures consistently into the 180s and was given dose of her home BP meds.  On reassessment she is little more oriented and able to state the month and the year states her chest pain has improved somewhat from when she arrived.  However given uptrending troponin was still fairly high blood pressures and concerned that today's episode like there is room to hypertensive emergency and I think observation is warranted for trending of troponins and to ensure patient's blood pressure is under better control.  She is amenable to this plan.  Will admit to medicine service for further evaluation management.  Will give ASA pending repeat troponin although will defer heparin as she denies any chest pain is a reassuring EKG.     ____________________________________________   FINAL CLINICAL IMPRESSION(S) / ED DIAGNOSES  Final diagnoses:  Hypertensive emergency  Troponin I above reference range  Altered mental status, unspecified altered mental status type    Medications  losartan (COZAAR) tablet 25 mg (25 mg Oral Given 10/25/20 2232)  magnesium sulfate IVPB 2 g 50 mL (0 g Intravenous Stopped  10/25/20 2215)  prochlorperazine (COMPAZINE) injection 10 mg (10 mg Intravenous Given 10/25/20 2027)  lactated ringers bolus 500 mL (0 mLs Intravenous Stopped 10/25/20 2232)  labetalol (NORMODYNE) injection 10 mg (10 mg Intravenous Given 10/25/20 1951)  iohexol (OMNIPAQUE) 350 MG/ML injection 100 mL (100 mLs Intravenous Contrast Given 10/25/20 2059)  potassium chloride SA (KLOR-CON) CR tablet 40 mEq (40 mEq Oral Given 10/25/20 2232)  aspirin chewable tablet 324 mg (324 mg Oral Given 10/25/20 2232)     ED Discharge Orders     None        Note:  This document was prepared using Dragon voice recognition software and may include unintentional dictation errors.    Gilles Chiquito, MD 10/25/20 [phone]

## 2020-10-25 NOTE — H&P (Signed)
[place]   PATIENT NAME: Alice [name]    MR#:  [id number]  DATE OF BIRTH:  1941-04-07  DATE OF ADMISSION:  10/25/2020  PRIMARY CARE PHYSICIAN: Su Monks, PA   Patient is coming from: Home  REQUESTING/REFERRING PHYSICIAN: Antoine Primas, MD  CHIEF COMPLAINT:   Chief Complaint  Patient presents with   Altered Mental Status    HISTORY OF PRESENT ILLNESS:  Alice [name] is a 80 y.o. Caucasian female with medical history significant for hypertension, dyslipidemia, hypothyroidism, migraine, polymyalgia rheumatica, anxiety and depression as well as GERD who presented to the emergency room with acute onset of altered mental status with confusion as well as associated headache without dizziness or blurred vision or paresthesias or focal muscle weakness.  She experienced midsternal chest pain felt this tightness and graded 10/10 in severity with radiation to her back as well as both arms.  She also complained of dysuria and urinary frequency and urgency without hematuria or flank pain.  ED Course: When she came to the ER blood pressure was 200/116 with a heart rate of 102 with otherwise normal vital signs.  Labs revealed mild hypokalemia of 3.3 with otherwise unremarkable CMP.  High-sensitivity troponin I was 29 and later 64.  CBC was within normal.  TSH was 0.17.  Influenza antigens and COVID-19 PCR came back negative.  UA was unremarkable. EKG as reviewed by me : EKG showed normal sinus rhythm with a rate of 96 Imaging: Noncontrasted head CT scan revealed mild atrophic and chronic white matter ischemic changes without acute abnormality.  Chest and abdomen CTA revealed no evidence for PE or aortic dissection.  It showed changes of her prior left thyroidectomy with residual right thyroid demonstrating scattered calcifications and hypodensities without discrete nodules.  It is enlarged and stable in appearance from prior thyroid ultrasound.  Recommendation was for  nonemergent follow-up thyroid ultrasound.  It showed fatty infiltration of the liver and diverticulosis without diverticulitis.  The patient was given 500 mL IV lactated ringer bolus, 25 mg p.o. Cozaar and 20 mg IV labetalol, 10 mg of IV Compazine, 40 mill: P.o. potassium chloride and 2 g of IV magnesium sulfate as well as 4 baby aspirin.  She will be admitted to an observation progressive unit bed for further evaluation and management.   PAST MEDICAL HISTORY:   Past Medical History:  Diagnosis Date   Anxiety    Arthritis    Depression    Fibromyalgia    GERD (gastroesophageal reflux disease)    History of carpal tunnel syndrome    History of diverticulitis    History of dysphagia    History of vertigo    Hypercholesteremia    Hypertension    Hypothyroidism    Migraine    Neuralgia 2014   Obesity    Polymyalgia rheumatica (HCC)     PAST SURGICAL HISTORY:   Past Surgical History:  Procedure Laterality Date   ABDOMINAL HYSTERECTOMY     BREAST CYST ASPIRATION Right +/- 10 yrs ago   x 2 neg   CHOLECYSTECTOMY     COLON SURGERY     COLONOSCOPY  09/08/2006   COLONOSCOPY WITH PROPOFOL N/A 07/16/2017   Procedure: COLONOSCOPY WITH PROPOFOL;  Surgeon: Scot Jun, MD;  Location: [facility] ENDOSCOPY;  Service: Endoscopy;  Laterality: N/A;   ESOPHAGOGASTRODUODENOSCOPY  01/24/2009   HERNIA REPAIR     JOINT REPLACEMENT Right    knee 2015   THYROIDECTOMY  SOCIAL HISTORY:   Social History   Tobacco Use   Smoking status: Former    Types: Cigarettes    Quit date: 02/26/1982    Years since quitting: 38.6   Smokeless tobacco: Never  Substance Use Topics   Alcohol use: No    FAMILY HISTORY:   Family History  Problem Relation Age of Onset   Arthritis Mother    Arthritis Father    COPD Father    Arthritis Sister    Diabetes Sister    Cancer Brother    COPD Brother    Cancer Son    Drug abuse Son    Stroke Maternal Grandmother    Arthritis Paternal Grandfather     Breast cancer Neg Hx     DRUG ALLERGIES:   Allergies  Allergen Reactions   Lac Bovis Other (See Comments)    Lactose Intolerance, headache LactoseIntolerance Lactose Intolerance, headache    Milk-Related Compounds    Peanut Oil Other (See Comments)    Headache Headache    Peanut-Containing Drug Products    Demerol [Meperidine] Palpitations and Other (See Comments)    RAYS COMES OUT OF EYES    REVIEW OF SYSTEMS:   ROS As per history of present illness. All pertinent systems were reviewed above. Constitutional, HEENT, cardiovascular, respiratory, GI, GU, musculoskeletal, neuro, psychiatric, endocrine, integumentary and hematologic systems were reviewed and are otherwise negative/unremarkable except for positive findings mentioned above in the HPI.   MEDICATIONS AT HOME:   Prior to Admission medications   Medication Sig Start Date End Date Taking? Authorizing Provider  [facility] D3 5000 units capsule TAKE 5,000 UNITS BY MOUTH ONCE DAILY. 10/10/15   [provider]  estradiol (ESTRACE VAGINAL) 0.1 MG/GM vaginal cream Apply 0.5mg  (pea-sized amount)  just inside the vaginal introitus with a finger-tip on Monday, Wednesday and Friday nights. 02/22/19   Michiel Cowboy A, PA-C  famotidine (PEPCID) 40 MG tablet Take 40 mg by mouth at bedtime. 04/19/19   [provider]  fluticasone (FLONASE) 50 MCG/ACT nasal spray Place into the nose. 10/28/19 10/27/20  [provider]  gabapentin (NEURONTIN) 400 MG capsule Take 400 mg by mouth 3 (three) times daily.  09/23/15   [provider]  losartan (COZAAR) 25 MG tablet Take 25 mg by mouth daily. 04/29/19   [provider]  methenamine (HIPREX) 1 g tablet Take 1 tablet (1 g total) by mouth in the morning and at bedtime. 03/08/20   Sondra Come, MD  oxybutynin (DITROPAN-XL) 10 MG 24 hr tablet TAKE 1 TABLET BY MOUTH EVERY DAY 11/25/19   McGowan, Carollee Herter A, PA-C  PARoxetine (PAXIL) 40 MG tablet Take 40 mg by mouth  daily. 09/11/15   [provider]  simvastatin (ZOCOR) 20 MG tablet Take by mouth 2 (two) times daily.  05/09/15   [provider]      VITAL SIGNS:  Blood pressure (!) 179/104, pulse 88, temperature 98.6 F (37 C), temperature source Oral, resp. rate 18, height 5\' 3"  (1.6 m), weight 88 kg, SpO2 93 %.  PHYSICAL EXAMINATION:  Physical Exam  GENERAL:  80 y.o.-year-old Caucasian female patient lying in the bed with no acute distress.  EYES: Pupils equal, round, reactive to light and accommodation. No scleral icterus. Extraocular muscles intact.  HEENT: Head atraumatic, normocephalic. Oropharynx and nasopharynx clear.  NECK:  Supple, no jugular venous distention. No thyroid enlargement, no tenderness.  LUNGS: Normal breath sounds bilaterally, no wheezing, rales,rhonchi or crepitation. No use of accessory muscles of  respiration.  CARDIOVASCULAR: Regular rate and rhythm, S1, S2 normal. No murmurs, rubs, or gallops.  ABDOMEN: Soft, nondistended, nontender. Bowel sounds present. No organomegaly or mass.  EXTREMITIES: No pedal edema, cyanosis, or clubbing.  NEUROLOGIC: Cranial nerves II through XII are intact. Muscle strength 5/5 in all extremities. Sensation intact. Gait not checked.  PSYCHIATRIC: The patient is alert and oriented x 3.  Normal affect and good eye contact. SKIN: No obvious rash, lesion, or ulcer.   LABORATORY PANEL:   CBC Recent Labs  Lab 10/25/20 1946  WBC 9.1  HGB 15.7*  HCT 44.0  PLT 189   ------------------------------------------------------------------------------------------------------------------  Chemistries  Recent Labs  Lab 10/25/20 1946  NA 139  K 3.3*  CL 105  CO2 24  GLUCOSE 175*  BUN 12  CREATININE 0.60  CALCIUM 9.6  MG 1.9  AST 25  ALT 19  ALKPHOS 69  BILITOT 1.2   ------------------------------------------------------------------------------------------------------------------  Cardiac Enzymes No results for input(s):  TROPONINI in the last 168 hours. ------------------------------------------------------------------------------------------------------------------  RADIOLOGY:  CT Head Wo Contrast  Result Date: 10/25/2020 CLINICAL DATA:  Headaches and mental status EXAM: CT HEAD WITHOUT CONTRAST TECHNIQUE: Contiguous axial images were obtained from the base of the skull through the vertex without intravenous contrast. COMPARISON:  12/18/2008 FINDINGS: Brain: No evidence of acute infarction, hemorrhage, hydrocephalus, extra-axial collection or mass lesion/mass effect. Mild atrophic and chronic white matter ischemic changes are noted. Vascular: No hyperdense vessel or unexpected calcification. Skull: Normal. Negative for fracture or focal lesion. Sinuses/Orbits: No acute finding. Other: None. IMPRESSION: Mild atrophic and chronic white matter ischemic changes without acute abnormality. Electronically Signed   By: Alcide Clever M.D.   On: 10/25/2020 21:11   CT Angio Chest/Abd/Pel for Dissection W and/or Wo Contrast  Result Date: 10/25/2020 CLINICAL DATA:  Chest and abdominal pain EXAM: CT ANGIOGRAPHY CHEST, ABDOMEN AND PELVIS TECHNIQUE: Non-contrast CT of the chest was initially obtained. Multidetector CT imaging through the chest, abdomen and pelvis was performed using the standard protocol during bolus administration of intravenous contrast. Multiplanar reconstructed images and MIPs were obtained and reviewed to evaluate the vascular anatomy. CONTRAST:  OMNIPAQUE IOHEXOL 350 MG/ML SOLN COMPARISON:  07/27/2020 FINDINGS: CTA CHEST FINDINGS Cardiovascular: Initial precontrast images demonstrate atherosclerotic calcifications of the thoracic aorta. Post-contrast images demonstrate no aneurysmal dilatation or dissection. No cardiac enlargement is seen. No pericardial effusion is noted. No significant coronary calcifications are noted. The visualized pulmonary artery shows no filling defect to suggest pulmonary embolism.  Mediastinum/Nodes: Changes of prior left thyroidectomy are noted. Scattered calcifications and hypodensities are noted without definitive discrete nodule. There is intrathoracic extension. No sizable hilar or mediastinal adenopathy is noted. Esophagus as visualized is within normal limits. Lungs/Pleura: Scarring is again seen in the right lower lobe stable in appearance from a prior exam from 2022 as well as 2021. No focal infiltrate or sizable effusion is seen. No parenchymal nodules are seen. Musculoskeletal: Degenerative changes of the thoracic spine are noted. No rib abnormality is seen. Review of the MIP images confirms the above findings. CTA ABDOMEN AND PELVIS FINDINGS VASCULAR Aorta: Atherosclerotic calcifications are noted without aneurysmal dilatation or dissection. Celiac: Patent without evidence of aneurysm, dissection, vasculitis or significant stenosis. SMA: Patent without evidence of aneurysm, dissection, vasculitis or significant stenosis. Renals: Both renal arteries are patent without evidence of aneurysm, dissection, vasculitis, fibromuscular dysplasia or significant stenosis. IMA: Patent without evidence of aneurysm, dissection, vasculitis or significant stenosis. Inflow: Iliac arteries demonstrate atherosclerotic calcifications without aneurysmal dilatation. Veins: No specific venous  abnormality is seen. Review of the MIP images confirms the above findings. NON-VASCULAR Hepatobiliary: Fatty infiltration of the liver is noted. Gallbladder has been surgically removed. Pancreas: Pancreas is within normal limits. Spleen: Normal in size without focal abnormality. Adrenals/Urinary Tract: Adrenal glands are unremarkable. Renal cystic changes are seen bilaterally. No renal calculi or obstructive changes are noted. Renal cysts are stable from the prior exam. The bladder is well distended. Stomach/Bowel: Scattered diverticular changes noted without evidence of diverticulitis. No obstructive or inflammatory  changes are seen. The appendix is within normal limits. No acute small bowel or gastric abnormality is seen. Duodenal diverticulum is noted adjacent to the head of the pancreas. Lymphatic: No significant lymphadenopathy is seen. Reproductive: Status post hysterectomy. No adnexal masses. Other: No abdominal wall hernia or abnormality. No abdominopelvic ascites. Musculoskeletal: Degenerative changes of lumbar spine are noted. No acute abnormality is seen. Review of the MIP images confirms the above findings. IMPRESSION: CTA of the chest: No evidence of aortic dissection or pulmonary embolism. Changes of prior left thyroidectomy. The residual right thyroid demonstrates scattered calcifications and hypodensities without discrete nodule. It is enlarged and stable in appearance from prior thyroid ultrasound from 07/09/2019. Recommend nonemergent follow-up thyroid US as previously recommended(ref: J Am Coll Radiol. 2015 Feb;12(2): 143-50). CTA of the abdomen and pelvis: No aneurysmal dilatation or dissection is noted. Fatty infiltration of the liver. Diverticulosis without diverticulitis. Electronically Signed   By: Alcide CleverMark  Lukens M.D.   On: 10/25/2020 21:30      IMPRESSION AND PLAN:  Active Problems:   Chest pain  1.  Acute encephalopathy like secondary to hypertensive urgency. - The patient will be admitted to observation progressive unit. - She will be placed on.  IV labetalol. - We will continue Cozaar. - Will continue monitoring her BP.  2.  Chest pain with elevated troponin I, rule out ACS.  This could be secondary to hypertensive urgency. - We will continue following serial troponin I's. - Cardiology consult will be obtained in a.m. - I notified Dr. Lady GaryFath about the patient. - The patient will be on aspirin as well as high-dose statin and will add beta-blocker therapy. - We will hold off her Estrace.  3.  Dyslipidemia. - We will place on high-dose statin as above and check fasting lipids. - We  will continue her fish oil.    4.  GERD. - We will continue H2 blocker therapy.  5.  Depression and anxiety. - Continue Paxil.  6.  History of lumbar radiculopathy. - We will continue Neurontin.   DVT prophylaxis: Lovenox. Code Status: full code. Family Communication:  The plan of care was discussed in details with the patient (and her husband who was with her in the room.). I answered all questions. The patient agreed to proceed with the above mentioned plan. Further management will depend upon hospital course. Disposition Plan: Back to previous home environment Consults called: Cardiology consult. All the records are reviewed and case discussed with ED provider.  Status is: Observation  The patient remains OBS appropriate and will d/c before 2 midnights.  Dispo: The patient is from: Home              Anticipated d/c is to: Home              Patient currently is not medically stable to d/c.   Difficult to place patient No  TOTAL TIME TAKING CARE OF THIS PATIENT: 55 minutes.    Hannah BeatJan A Karessa Onorato M.D on 10/25/2020 at  11:59 PM  [facility]   From 7 PM-7 AM, contact night-coverage [email or web address]  CC: Primary care physician; Su Monks, PA

## 2020-10-25 NOTE — ED Notes (Signed)
ED Provider at bedside. 

## 2020-10-26 DIAGNOSIS — R778 Other specified abnormalities of plasma proteins: Secondary | ICD-10-CM | POA: Diagnosis not present

## 2020-10-26 DIAGNOSIS — R7989 Other specified abnormal findings of blood chemistry: Secondary | ICD-10-CM

## 2020-10-26 DIAGNOSIS — I161 Hypertensive emergency: Secondary | ICD-10-CM | POA: Diagnosis not present

## 2020-10-26 DIAGNOSIS — R4182 Altered mental status, unspecified: Secondary | ICD-10-CM | POA: Diagnosis not present

## 2020-10-26 LAB — BASIC METABOLIC PANEL
Anion gap: 6 (ref 5–15)
BUN: 8 mg/dL (ref 8–23)
CO2: 23 mmol/L (ref 22–32)
Calcium: 8.6 mg/dL — ABNORMAL LOW (ref 8.9–10.3)
Chloride: 107 mmol/L (ref 98–111)
Creatinine, Ser: 0.53 mg/dL (ref 0.44–1.00)
GFR, Estimated: >60 mL/min (ref >60)
Glucose, Bld: 141 mg/dL — ABNORMAL HIGH (ref 70–99)
Potassium: 4 mmol/L (ref 3.5–5.1)
Sodium: 136 mmol/L (ref 135–145)

## 2020-10-26 LAB — PROTIME-INR
INR: 1 (ref 0.8–1.2)
Prothrombin Time: 13.6 seconds (ref 11.4–15.2)

## 2020-10-26 LAB — CBC
HCT: 41.3 % (ref 36.0–46.0)
Hemoglobin: 14.8 g/dL (ref 12.0–15.0)
MCH: 32.6 pg (ref 26.0–34.0)
MCHC: 35.8 g/dL (ref 30.0–36.0)
MCV: 91 fL (ref 80.0–100.0)
Platelets: 197 10*3/uL (ref 150–400)
RBC: 4.54 MIL/uL (ref 3.87–5.11)
RDW: 12.3 % (ref 11.5–15.5)
WBC: 13.2 10*3/uL — ABNORMAL HIGH (ref 4.0–10.5)
nRBC: 0 % (ref 0.0–0.2)

## 2020-10-26 LAB — TROPONIN I (HIGH SENSITIVITY)
Troponin I (High Sensitivity): 111 ng/L (ref <18)
Troponin I (High Sensitivity): 57 ng/L — ABNORMAL HIGH (ref <18)

## 2020-10-26 LAB — APTT: aPTT: 33 seconds (ref 24–36)

## 2020-10-26 MED ORDER — ASPIRIN 325 MG PO TBEC: 325.0000 mg | DELAYED_RELEASE_TABLET | Freq: Every day | ORAL | 0 refills | Status: DC

## 2020-10-26 MED ORDER — METOPROLOL TARTRATE 25 MG PO TABS
25.0000 mg | ORAL_TABLET | Freq: Two times a day (BID) | ORAL | Status: DC
  Administered 2020-10-26: 25 mg via ORAL
  Filled 2020-10-26: qty 1

## 2020-10-26 MED ORDER — ATORVASTATIN CALCIUM 20 MG PO TABS
80.0000 mg | ORAL_TABLET | Freq: Every day | ORAL | Status: DC
  Administered 2020-10-26: 80 mg via ORAL
  Filled 2020-10-26: qty 4

## 2020-10-26 MED ORDER — METOPROLOL TARTRATE 25 MG PO TABS: 25.0000 mg | ORAL_TABLET | Freq: Two times a day (BID) | ORAL | 0 refills | Status: AC

## 2020-10-26 MED ORDER — MORPHINE SULFATE (PF) 2 MG/ML IV SOLN: 1.0000 mg | INTRAVENOUS | Status: DC | PRN

## 2020-10-26 MED ORDER — FAMOTIDINE 20 MG PO TABS: 40.0000 mg | ORAL_TABLET | Freq: Every day | ORAL | Status: DC

## 2020-10-26 MED ORDER — NITROGLYCERIN 0.4 MG SL SUBL: 0.4000 mg | SUBLINGUAL_TABLET | SUBLINGUAL | Status: DC | PRN

## 2020-10-26 MED ORDER — METHENAMINE MANDELATE 1 G PO TABS
1.0000 g | ORAL_TABLET | Freq: Two times a day (BID) | ORAL | Status: DC
  Administered 2020-10-26: 1 g via ORAL
  Filled 2020-10-26 (×2): qty 1

## 2020-10-26 MED ORDER — HEPARIN BOLUS VIA INFUSION
4000.0000 [IU] | Freq: Once | INTRAVENOUS | Status: AC
  Administered 2020-10-26: 4000 [IU] via INTRAVENOUS
  Filled 2020-10-26: qty 4000

## 2020-10-26 MED ORDER — LABETALOL HCL 5 MG/ML IV SOLN
INTRAVENOUS | Status: AC
  Administered 2020-10-26: 20 mg via INTRAVENOUS
  Filled 2020-10-26: qty 4

## 2020-10-26 MED ORDER — HEPARIN (PORCINE) 25000 UT/250ML-% IV SOLN
900.0000 [IU]/h | INTRAVENOUS | Status: DC
  Administered 2020-10-26: 900 [IU]/h via INTRAVENOUS
  Filled 2020-10-26: qty 250

## 2020-10-26 MED ORDER — LABETALOL HCL 5 MG/ML IV SOLN: 20.0000 mg | INTRAVENOUS | Status: DC | PRN

## 2020-10-26 NOTE — Consult Note (Signed)
CARDIOLOGY CONSULT NOTE               Patient ID: Alice [name] MRN: [id number] DOB/AGE: 1941-02-20 80 y.o.  Admit date: 10/25/2020 Referring Physician Dr. Valente David hospitalist Primary Physician Sallyanne Havers, PA Primary Cardiologist None Reason for Consultation chest pain altered mental status borderline troponins  HPI: Patient is a 80 year old female history of hypertension hyperlipidemia thyroid disease migraines polymyalgia rheumatica depression GERD mild obesity presented emergency room with acute onset of altered mental status confusion with dizziness blurred vision as well as paresthesias and focal weakness the patient also experienced midsternal chest discomfort tightness 10 out of 10 radiation to her back and her arms patient also had some urinary symptoms.  No fever chills or sweats was found to have systolic blood pressures above 200 laboratories showed borderline troponins EKG was nondiagnostic COVID-negative pain improved after blood pressure control and cardiology was consulted for further evaluation  Review of systems complete and found to be negative unless listed above     Past Medical History:  Diagnosis Date   Anxiety    Arthritis    Depression    Fibromyalgia    GERD (gastroesophageal reflux disease)    History of carpal tunnel syndrome    History of diverticulitis    History of dysphagia    History of vertigo    Hypercholesteremia    Hypertension    Hypothyroidism    Migraine    Neuralgia 2014   Obesity    Polymyalgia rheumatica (HCC)     Past Surgical History:  Procedure Laterality Date   ABDOMINAL HYSTERECTOMY     BREAST CYST ASPIRATION Right +/- 10 yrs ago   x 2 neg   CHOLECYSTECTOMY     COLON SURGERY     COLONOSCOPY  09/08/2006   COLONOSCOPY WITH PROPOFOL N/A 07/16/2017   Procedure: COLONOSCOPY WITH PROPOFOL;  Surgeon: Scot Jun, MD;  Location: [facility] ENDOSCOPY;  Service: Endoscopy;  Laterality: N/A;    ESOPHAGOGASTRODUODENOSCOPY  01/24/2009   HERNIA REPAIR     JOINT REPLACEMENT Right    knee 2015   THYROIDECTOMY      (Not in a hospital admission)  Social History   Socioeconomic History   Marital status: Married    Spouse name: Not on file   Number of children: Not on file   Years of education: Not on file   Highest education level: Not on file  Occupational History   Not on file  Tobacco Use   Smoking status: Former    Types: Cigarettes    Quit date: 02/26/1982    Years since quitting: 38.6   Smokeless tobacco: Never  Vaping Use   Vaping Use: Never used  Substance and Sexual Activity   Alcohol use: No   Drug use: No   Sexual activity: Not Currently  Other Topics Concern   Not on file  Social History Narrative   Not on file   Social Determinants of Health   Financial Resource Strain: Not on file  Food Insecurity: Not on file  Transportation Needs: Not on file  Physical Activity: Not on file  Stress: Not on file  Social Connections: Not on file  Intimate Partner Violence: Not on file    Family History  Problem Relation Age of Onset   Arthritis Mother    Arthritis Father    COPD Father    Arthritis Sister    Diabetes Sister    Cancer Brother    COPD Brother  Cancer Son    Drug abuse Son    Stroke Maternal Grandmother    Arthritis Paternal Grandfather    Breast cancer Neg Hx       Review of systems complete and found to be negative unless listed above      PHYSICAL EXAM  General: Well developed, well nourished, in no acute distress HEENT:  Normocephalic and atramatic Neck:  No JVD.  Lungs: Clear bilaterally to auscultation and percussion. Heart: HRRR . Normal S1 and S2 without gallops or murmurs.  Abdomen: Bowel sounds are positive, abdomen soft and non-tender  Msk:  Back normal, normal gait. Normal strength and tone for age. Extremities: No clubbing, cyanosis or edema.   Neuro: Alert and oriented X 3. Psych:  Good affect, responds  appropriately  Labs:   Lab Results  Component Value Date   WBC 13.2 (H) 10/26/2020   HGB 14.8 10/26/2020   HCT 41.3 10/26/2020   MCV 91.0 10/26/2020   PLT 197 10/26/2020    Recent Labs  Lab 10/25/20 1946 10/26/20 0431  NA 139 136  K 3.3* 4.0  CL 105 107  CO2 24 23  BUN 12 8  CREATININE 0.60 0.53  CALCIUM 9.6 8.6*  PROT 7.2  --   BILITOT 1.2  --   ALKPHOS 69  --   ALT 19  --   AST 25  --   GLUCOSE 175* 141*   Lab Results  Component Value Date   TROPONINI <0.03 04/27/2016   No results found for: CHOL No results found for: HDL No results found for: LDLCALC No results found for: TRIG No results found for: CHOLHDL No results found for: LDLDIRECT    Radiology: CT Head Wo Contrast  Result Date: 10/25/2020 CLINICAL DATA:  Headaches and mental status EXAM: CT HEAD WITHOUT CONTRAST TECHNIQUE: Contiguous axial images were obtained from the base of the skull through the vertex without intravenous contrast. COMPARISON:  12/18/2008 FINDINGS: Brain: No evidence of acute infarction, hemorrhage, hydrocephalus, extra-axial collection or mass lesion/mass effect. Mild atrophic and chronic white matter ischemic changes are noted. Vascular: No hyperdense vessel or unexpected calcification. Skull: Normal. Negative for fracture or focal lesion. Sinuses/Orbits: No acute finding. Other: None. IMPRESSION: Mild atrophic and chronic white matter ischemic changes without acute abnormality. Electronically Signed   By: Alcide Clever M.D.   On: 10/25/2020 21:11   CT Angio Chest/Abd/Pel for Dissection W and/or Wo Contrast  Result Date: 10/25/2020 CLINICAL DATA:  Chest and abdominal pain EXAM: CT ANGIOGRAPHY CHEST, ABDOMEN AND PELVIS TECHNIQUE: Non-contrast CT of the chest was initially obtained. Multidetector CT imaging through the chest, abdomen and pelvis was performed using the standard protocol during bolus administration of intravenous contrast. Multiplanar reconstructed images and MIPs were obtained  and reviewed to evaluate the vascular anatomy. CONTRAST:  OMNIPAQUE IOHEXOL 350 MG/ML SOLN COMPARISON:  07/27/2020 FINDINGS: CTA CHEST FINDINGS Cardiovascular: Initial precontrast images demonstrate atherosclerotic calcifications of the thoracic aorta. Post-contrast images demonstrate no aneurysmal dilatation or dissection. No cardiac enlargement is seen. No pericardial effusion is noted. No significant coronary calcifications are noted. The visualized pulmonary artery shows no filling defect to suggest pulmonary embolism. Mediastinum/Nodes: Changes of prior left thyroidectomy are noted. Scattered calcifications and hypodensities are noted without definitive discrete nodule. There is intrathoracic extension. No sizable hilar or mediastinal adenopathy is noted. Esophagus as visualized is within normal limits. Lungs/Pleura: Scarring is again seen in the right lower lobe stable in appearance from a prior exam from 2022 as well as  2021. No focal infiltrate or sizable effusion is seen. No parenchymal nodules are seen. Musculoskeletal: Degenerative changes of the thoracic spine are noted. No rib abnormality is seen. Review of the MIP images confirms the above findings. CTA ABDOMEN AND PELVIS FINDINGS VASCULAR Aorta: Atherosclerotic calcifications are noted without aneurysmal dilatation or dissection. Celiac: Patent without evidence of aneurysm, dissection, vasculitis or significant stenosis. SMA: Patent without evidence of aneurysm, dissection, vasculitis or significant stenosis. Renals: Both renal arteries are patent without evidence of aneurysm, dissection, vasculitis, fibromuscular dysplasia or significant stenosis. IMA: Patent without evidence of aneurysm, dissection, vasculitis or significant stenosis. Inflow: Iliac arteries demonstrate atherosclerotic calcifications without aneurysmal dilatation. Veins: No specific venous abnormality is seen. Review of the MIP images confirms the above findings. NON-VASCULAR  Hepatobiliary: Fatty infiltration of the liver is noted. Gallbladder has been surgically removed. Pancreas: Pancreas is within normal limits. Spleen: Normal in size without focal abnormality. Adrenals/Urinary Tract: Adrenal glands are unremarkable. Renal cystic changes are seen bilaterally. No renal calculi or obstructive changes are noted. Renal cysts are stable from the prior exam. The bladder is well distended. Stomach/Bowel: Scattered diverticular changes noted without evidence of diverticulitis. No obstructive or inflammatory changes are seen. The appendix is within normal limits. No acute small bowel or gastric abnormality is seen. Duodenal diverticulum is noted adjacent to the head of the pancreas. Lymphatic: No significant lymphadenopathy is seen. Reproductive: Status post hysterectomy. No adnexal masses. Other: No abdominal wall hernia or abnormality. No abdominopelvic ascites. Musculoskeletal: Degenerative changes of lumbar spine are noted. No acute abnormality is seen. Review of the MIP images confirms the above findings. IMPRESSION: CTA of the chest: No evidence of aortic dissection or pulmonary embolism. Changes of prior left thyroidectomy. The residual right thyroid demonstrates scattered calcifications and hypodensities without discrete nodule. It is enlarged and stable in appearance from prior thyroid ultrasound from 07/09/2019. Recommend nonemergent follow-up thyroid US as previously recommended(ref: J Am Coll Radiol. 2015 Feb;12(2): 143-50). CTA of the abdomen and pelvis: No aneurysmal dilatation or dissection is noted. Fatty infiltration of the liver. Diverticulosis without diverticulitis. Electronically Signed   By: Alcide Clever M.D.   On: 10/25/2020 21:30    EKG: Normal sinus rhythm nonspecific T2 changes  ASSESSMENT AND PLAN:  Acute encephalopathy Hypertensive urgency Chest pain Borderline troponins Dyslipidemia GERD Mild obesity Hypokalemia History of depression . Plan Agree  with troponins which appeared to be reasonably benign probably demand ischemia Correct electrolytes Recommend aggressive blood pressure management and control Cozaar Recommend statin therapy of hyperlipidemia Consider weight loss exercise portion control Echocardiogram for assessment of left ventricular function can be done as an outpatient Recommend functional study Lexiscan Myoview also can be done as an outpatient Discontinue heparin in favor of aspirin Recommend omeprazole or Protonix for reflux type symptoms I am in favor of discharge and follow-up in the office within a short time for noninvasive evaluation   Signed: Alwyn Pea MD 10/26/2020, 1:44 PM

## 2020-10-26 NOTE — ED Notes (Signed)
Pt stated she "felt wet". Brief changed as well as bedding and chux, pt cleaned up and new purewick placed.

## 2020-10-26 NOTE — Discharge Summary (Signed)
Physician Discharge Summary  Patient ID: Alice [name] MRN: [id number] DOB/AGE: 1940-12-29 80 y.o.  Admit date: 10/25/2020 Discharge date: 10/26/2020  Admission Diagnoses:  Discharge Diagnoses:  Active Problems:   Fibromyalgia   Hypertension   Severe obesity (BMI 35.0-39.9) with comorbidity (HCC)   Chest pain   Hypertensive emergency   Elevated troponin Acute hypertensive encephalopathy Acute hypertensive emergency   Discharged Condition: fair  Hospital Course:  Alice [name] is a 80 y.o. Caucasian female with medical history significant for hypertension, dyslipidemia, hypothyroidism, migraine, polymyalgia rheumatica, anxiety and depression as well as GERD who presented to the emergency room with acute onset of altered mental status with confusion as well as associated headache without dizziness or blurred vision or paresthesias or focal muscle weakness.  She experienced midsternal chest pain felt this tightness and graded 10/10 in severity with radiation to her back as well as both arms. Her troponin was 111, she has been seen by cardiology.  I spoke with Dr. Juliann Pares, patient elevated troponin was secondary to hypertensive emergency.  No need for further work-up.  At this point, she is medically stable to be discharged. Patient also had some urinary urgency and incontinence, UA does not support UTI.  No need for antibiotics. Her blood pressure has been modified at this point, she is given losartan and metoprolol, blood pressure is better.  She will need to follow-up with PCP and cardiology as outpatient.  Consults: cardiology  Significant Diagnostic Studies:   Treatments: IV fluids, heparin  Discharge Exam: Blood pressure 138/67, pulse (!) 41, temperature 98.2 F (36.8 C), temperature source Oral, resp. rate (!) 25, height 5\' 3"  (1.6 m), weight 88 kg, SpO2 92 %. General appearance: alert and cooperative Resp: clear to auscultation bilaterally Cardio: regular rate and  rhythm, S1, S2 normal, no murmur, click, rub or gallop GI: soft, non-tender; bowel sounds normal; no masses,  no organomegaly Extremities: extremities normal, atraumatic, no cyanosis or edema  Disposition: Discharge disposition: 01-Home or Self Care       Discharge Instructions     Diet - low sodium heart healthy   Complete by: As directed    Increase activity slowly   Complete by: As directed       Allergies as of 10/26/2020       Reactions   Lac Bovis Other (See Comments)   Lactose Intolerance, headache LactoseIntolerance Lactose Intolerance, headache   Milk-related Compounds    Peanut Oil Other (See Comments)   Headache Headache   Peanut-containing Drug Products    Demerol [meperidine] Palpitations, Other (See Comments)   RAYS COMES OUT OF EYES        Medication List     STOP taking these medications    estradiol 0.1 MG/GM vaginal cream Commonly known as: ESTRACE VAGINAL       TAKE these medications    aspirin 325 MG EC tablet Take 1 tablet (325 mg total) by mouth daily. Start taking on: October 27, 2020   [facility] D3 125 MCG (5000 UT) capsule Generic drug: Cholecalciferol TAKE 5,000 UNITS BY MOUTH ONCE DAILY.   famotidine 40 MG tablet Commonly known as: PEPCID Take 40 mg by mouth at bedtime.   fluticasone 50 MCG/ACT nasal spray Commonly known as: FLONASE Place into the nose.   gabapentin 400 MG capsule Commonly known as: NEURONTIN Take 400 mg by mouth 3 (three) times daily.   losartan 25 MG tablet Commonly known as: COZAAR Take 25 mg by mouth daily.   methenamine 1  g tablet Commonly known as: HIPREX Take 1 tablet (1 g total) by mouth in the morning and at bedtime.   metoprolol tartrate 25 MG tablet Commonly known as: LOPRESSOR Take 1 tablet (25 mg total) by mouth 2 (two) times daily.   oxybutynin 10 MG 24 hr tablet Commonly known as: DITROPAN-XL TAKE 1 TABLET BY MOUTH EVERY DAY   PARoxetine 40 MG tablet Commonly known as: PAXIL Take  40 mg by mouth daily.   simvastatin 20 MG tablet Commonly known as: ZOCOR Take by mouth 2 (two) times daily.        Follow-up Information     Su Monks, PA Follow up in 1 week(s).   Specialty: Physician Assistant Contact information: [address] [place] [address] [phone]         Alwyn Pea, MD Follow up in 1 week(s).   Specialties: Cardiology, Internal Medicine Contact information: [address] [place] [address] [phone]                 Signed: Marrion Coy 10/26/2020, 1:41 PM

## 2020-10-26 NOTE — ED Notes (Signed)
Husband at bedside states the patient was acting a little confused this morning, but got worse an hour PTA.

## 2020-10-26 NOTE — ED Notes (Signed)
Pt resting comfortably at this time. NAD noted. Pt denies any needs at this time. Call bell in reach. Husband at bedside.

## 2020-10-26 NOTE — Progress Notes (Signed)
ANTICOAGULATION CONSULT NOTE   Pharmacy Consult for heparin infusion Indication: ACS/STEMI  Allergies  Allergen Reactions   Lac Bovis Other (See Comments)    Lactose Intolerance, headache LactoseIntolerance Lactose Intolerance, headache    Milk-Related Compounds    Peanut Oil Other (See Comments)    Headache Headache    Peanut-Containing Drug Products    Demerol [Meperidine] Palpitations and Other (See Comments)    RAYS COMES OUT OF EYES    Patient Measurements: Height: 5\' 3"  (160 cm) Weight: 88 kg (193 lb 14.4 oz) IBW/kg (Calculated) : 52.4 Heparin Dosing Weight: 72.2 kg  Vital Signs: Temp: 98.6 F (37 C) (07/13 1941) Temp Source: Oral (07/13 1941) BP: 142/69 (07/14 0600) Pulse Rate: 80 (07/14 0600)  Labs: Recent Labs    10/25/20 1946 10/25/20 2219 10/26/20 0431  HGB 15.7*  --  14.8  HCT 44.0  --  41.3  PLT 189  --  197  CREATININE 0.60  --  0.53  TROPONINIHS 29* 64* 111*    Estimated Creatinine Clearance: 61.9 mL/min (by C-G formula based on SCr of 0.53 mg/dL).   Medical History: Past Medical History:  Diagnosis Date   Anxiety    Arthritis    Depression    Fibromyalgia    GERD (gastroesophageal reflux disease)    History of carpal tunnel syndrome    History of diverticulitis    History of dysphagia    History of vertigo    Hypercholesteremia    Hypertension    Hypothyroidism    Migraine    Neuralgia 2014   Obesity    Polymyalgia rheumatica (HCC)     Assessment: Pt is 80 yo female presenting to ED with AMS, headache, and midsternal, radiating chest pain/tightness.  Also c/o dysuria and urinary frequency and urgency.  Pt found with elevated troponin I, trending up.  Goal of Therapy:  Heparin level 0.3-0.7 units/ml Monitor platelets by anticoagulation protocol: Yes   Plan:  D/C Lovenox Proph; Ordered Baseline labs. Give 4000 units bolus x 1 Start heparin infusion at 900 units/hr Check anti-Xa level in 8 hours and daily while on  heparin Continue to monitor H&H and platelets  62, PharmD, [facility] 10/26/2020 6:27 AM

## 2020-10-26 NOTE — ED Notes (Signed)
Awaiting pharmacy med verification.  

## 2020-10-28 LAB — URINE CULTURE: Culture: >=100000 — AB

## 2020-10-29 NOTE — Progress Notes (Signed)
Antimicrobial Stewardship Positive Culture Follow Up   Alice [name] is an 80 y.o. female who presented to [facility] on 10/25/2020 with a chief complaint of  Chief Complaint  Patient presents with   Altered Mental Status    Recent Results (from the past 720 hour(s))  Resp Panel by RT-PCR (Flu A&B, Covid) Nasopharyngeal Swab     Status: None   Collection Time: 10/25/20  7:46 PM   Specimen: Nasopharyngeal Swab; Nasopharyngeal(NP) swabs in vial transport medium  Result Value Ref Range Status   SARS Coronavirus 2 by RT PCR NEGATIVE NEGATIVE Final    Comment: (NOTE) SARS-CoV-2 target nucleic acids are NOT DETECTED.  The SARS-CoV-2 RNA is generally detectable in upper respiratory specimens during the acute phase of infection. The lowest concentration of SARS-CoV-2 viral copies this assay can detect is 138 copies/mL. A negative result does not preclude SARS-Cov-2 infection and should not be used as the sole basis for treatment or other patient management decisions. A negative result may occur with  improper specimen collection/handling, submission of specimen other than nasopharyngeal swab, presence of viral mutation(s) within the areas targeted by this assay, and inadequate number of viral copies(<138 copies/mL). A negative result must be combined with clinical observations, patient history, and epidemiological information. The expected result is Negative.  Fact Sheet for Patients:  BloggerCourse.com  Fact Sheet for Healthcare Providers:  SeriousBroker.it  This test is no t yet approved or cleared by the [place] FDA and  has been authorized for detection and/or diagnosis of SARS-CoV-2 by FDA under an Emergency Use Authorization (EUA). This EUA will remain  in effect (meaning this test can be used) for the duration of the COVID-19 declaration under Section 564(b)(1) of the Act, 21 U.S.C.section 360bbb-3(b)(1), unless the  authorization is terminated  or revoked sooner.       Influenza A by PCR NEGATIVE NEGATIVE Final   Influenza B by PCR NEGATIVE NEGATIVE Final    Comment: (NOTE) The Xpert Xpress SARS-CoV-2/FLU/RSV plus assay is intended as an aid in the diagnosis of influenza from Nasopharyngeal swab specimens and should not be used as a sole basis for treatment. Nasal washings and aspirates are unacceptable for Xpert Xpress SARS-CoV-2/FLU/RSV testing.  Fact Sheet for Patients: BloggerCourse.com  Fact Sheet for Healthcare Providers: SeriousBroker.it  This test is not yet approved or cleared by the [place] FDA and has been authorized for detection and/or diagnosis of SARS-CoV-2 by FDA under an Emergency Use Authorization (EUA). This EUA will remain in effect (meaning this test can be used) for the duration of the COVID-19 declaration under Section 564(b)(1) of the Act, 21 U.S.C. section 360bbb-3(b)(1), unless the authorization is terminated or revoked.  Performed at [facility], [address], [place], [place] [address]   Urine Culture     Status: Abnormal   Collection Time: 10/25/20  7:46 PM   Specimen: Urine, Random  Result Value Ref Range Status   Specimen Description   Final    URINE, RANDOM Performed at [facility], [address] [place], [place] [address]    Special Requests   Final    NONE Performed at [facility], [address] [place], [place] [address]    Culture >=100,000 COLONIES/mL ESCHERICHIA COLI (A)  Final   Report Status 10/28/2020 FINAL  Final   Organism ID, Bacteria ESCHERICHIA COLI (A)  Final      Susceptibility   Escherichia coli - MIC*    AMPICILLIN 4 SENSITIVE Sensitive  CEFAZOLIN <=4 SENSITIVE Sensitive     CEFEPIME <=0.12 SENSITIVE Sensitive     CEFTRIAXONE <=0.25 SENSITIVE Sensitive     CIPROFLOXACIN <=0.25 SENSITIVE Sensitive     GENTAMICIN <=1 SENSITIVE  Sensitive     IMIPENEM <=0.25 SENSITIVE Sensitive     NITROFURANTOIN <=16 SENSITIVE Sensitive     TRIMETH/SULFA <=20 SENSITIVE Sensitive     AMPICILLIN/SULBACTAM <=2 SENSITIVE Sensitive     PIP/TAZO <=4 SENSITIVE Sensitive     * >=100,000 COLONIES/mL ESCHERICHIA COLI    Patient discharged originally without antimicrobial agent on 7/14 by Dr Chipper Herb.  The urine culture has resulted and is showing >100,000 pansensitive E. Coli. Providers note reports patient did have urinary urgency and incontinence while she was here. Spoke with Dr. Chipper Herb about results. Provider would like to treat for 5 days.   New RX called in Patient's pharmacy on file ([facility]) on 7/17 @ 1108.   New antibiotic prescription: Keflex 500mg  3 times daily for 5 days.   ED Provider: Dr. , PharmD, BCPS Clinical Pharmacist 10/29/2020 11:09 AM

## 2020-11-08 ENCOUNTER — Ambulatory Visit: Payer: Self-pay | Admitting: Urology

## 2020-11-15 ENCOUNTER — Encounter: Payer: Self-pay | Admitting: Urology

## 2020-11-15 ENCOUNTER — Ambulatory Visit (INDEPENDENT_AMBULATORY_CARE_PROVIDER_SITE_OTHER): Payer: Medicare HMO | Admitting: Urology

## 2020-11-15 ENCOUNTER — Other Ambulatory Visit: Payer: Self-pay

## 2020-11-15 VITALS — BP 106/65 | HR 99 | Ht 60.0 in | Wt 195.0 lb

## 2020-11-15 DIAGNOSIS — N390 Urinary tract infection, site not specified: Secondary | ICD-10-CM

## 2020-11-15 DIAGNOSIS — N3281 Overactive bladder: Secondary | ICD-10-CM

## 2020-11-15 MED ORDER — METHENAMINE HIPPURATE 1 G PO TABS: 1.0000 g | ORAL_TABLET | Freq: Two times a day (BID) | ORAL | 6 refills | Status: DC

## 2020-11-15 MED ORDER — PREMARIN 0.625 MG/GM VA CREA
TOPICAL_CREAM | VAGINAL | 12 refills | Status: DC
Start: 2020-11-15 — End: 2022-08-07

## 2020-11-15 MED ORDER — NITROFURANTOIN MACROCRYSTAL 50 MG PO CAPS: 50.0000 mg | ORAL_CAPSULE | Freq: Every day | ORAL | 0 refills | Status: DC

## 2020-11-15 MED ORDER — SULFAMETHOXAZOLE-TRIMETHOPRIM 800-160 MG PO TABS: 1.0000 | ORAL_TABLET | Freq: Two times a day (BID) | ORAL | 0 refills | Status: AC

## 2020-11-15 NOTE — Progress Notes (Signed)
   11/15/2020 11:42 AM   Alice [name] 08-Mar-1941 [id number]  Reason for visit: Follow up recurrent UTIs, overactive bladder  HPI: 80 year old female with recurrent UTIs who has been on oxybutynin for OAB, methenamine twice daily for UTIs, topical estrogen cream for UTIs, and cranberry tablets.  She was prescribed low-dose antibiotic prophylaxis previously, but it does not sound like she ever started this.  She also is noncompliant with the topical estrogen cream.  She was recently hospitalized for chest pain, and urine culture ultimately grew E. coli, and it does not sound like this was ever treated with antibiotics.  She reports UTI symptoms of pelvic pain and pressure and urgency and frequency.  Will treat acute UTI with Bactrim DS twice daily x3 days, then transition to nitrofurantoin 50 mg daily prophylaxis for 90 days.  I encouraged her to resume the topical estrogen cream, and continue cranberry tablets and methenamine for prophylaxis.  She also had a CT performed in April 2022 that showed no urolithiasis, no gas in the bladder, no hydronephrosis.  I personally viewed and interpreted those images.  Continue oxybutynin, resume topical estrogen cream, continue methenamine Bactrim DS twice daily x3 days for acute UTI, then transition to 50 mg nitrofurantoin daily x90 days RTC 6 months symptom check, consider cystoscopy at that time if persistent infections   Sondra Come, MD  [facility] [address], [address] [place], [place] [address] [phone]

## 2021-01-13 ENCOUNTER — Other Ambulatory Visit: Payer: Self-pay

## 2021-01-13 ENCOUNTER — Ambulatory Visit
Admission: EM | Admit: 2021-01-13 | Discharge: 2021-01-13 | Disposition: A | Discharge status: Home / Self Care | Payer: Medicare HMO | Attending: Physician Assistant | Admitting: Physician Assistant

## 2021-01-13 ENCOUNTER — Ambulatory Visit (INDEPENDENT_AMBULATORY_CARE_PROVIDER_SITE_OTHER): Payer: Medicare HMO

## 2021-01-13 DIAGNOSIS — S2241XA Multiple fractures of ribs, right side, initial encounter for closed fracture: Secondary | ICD-10-CM | POA: Diagnosis not present

## 2021-01-13 DIAGNOSIS — R1010 Upper abdominal pain, unspecified: Secondary | ICD-10-CM

## 2021-01-13 DIAGNOSIS — R109 Unspecified abdominal pain: Secondary | ICD-10-CM | POA: Diagnosis not present

## 2021-01-13 DIAGNOSIS — T148XXA Other injury of unspecified body region, initial encounter: Secondary | ICD-10-CM

## 2021-01-13 DIAGNOSIS — W19XXXA Unspecified fall, initial encounter: Secondary | ICD-10-CM | POA: Diagnosis not present

## 2021-01-13 MED ORDER — TRAMADOL HCL 50 MG PO TABS: 50.0000 mg | ORAL_TABLET | Freq: Three times a day (TID) | ORAL | 0 refills | Status: AC | PRN

## 2021-01-13 NOTE — ED Provider Notes (Signed)
[facility]    CSN: [id number] Arrival date & time: 01/13/21  1306      History   Chief Complaint Chief Complaint  Patient presents with   Fall    HPI Alice [name] is a 80 y.o. female presenting for right lateral and anterior rib pain since an accidental fall 2 nights ago.  Patient says she just lost her balance and hit her side on a wooden chair.  Patient reports falling a couple days before that when she apparently lost her balance again.  She admits to long history of balance problems but says she has been more unsteady recently.  Patient reports that she does not always use her cane although she is supposed to.  She also has a walker that she does not commonly use.  Patient denies any inciting dizziness but says she occasionally does get vertigo.  She does not report any chest pain, severe headache, palpitations, breathing difficulty.  Patient denies any head injury or syncope.  She does report that her abdomen hurts.  She has a large bruise on her right flank.  Patient has taken Tylenol for pain but says it has not helped.  Pain is increased when she takes a deep breath or rotates to the right side.  Past medical history significant for hypertension, polymyalgia rheumatica, anxiety, depression, fibromyalgia, vertigo.  No other complaints.  HPI  Past Medical History:  Diagnosis Date   Anxiety    Arthritis    Depression    Fibromyalgia    GERD (gastroesophageal reflux disease)    History of carpal tunnel syndrome    History of diverticulitis    History of dysphagia    History of vertigo    Hypercholesteremia    Hypertension    Hypothyroidism    Migraine    Neuralgia 2014   Obesity    Polymyalgia rheumatica (HCC)     Patient Active Problem List   Diagnosis Date Noted   Hypertensive emergency 10/26/2020   Elevated troponin 10/26/2020   Chest pain 10/25/2020   Stage 3a chronic kidney disease (HCC) 10/29/2019   Lumbar radiculopathy 07/08/2019   Abnormal  gait 06/17/2018   Displacement of lumbar intervertebral disc without myelopathy 06/17/2018   Impingement syndrome of shoulder region 06/17/2018   Knee stiff 06/17/2018   Mild cognitive impairment 07/03/2017   Generalized abdominal pain 05/08/2017   Hx of adenomatous colonic polyps 05/08/2017   Increased frequency of urination 05/08/2017   Urinary and fecal incontinence 05/08/2017   Chronic pain syndrome 07/09/2016   Musculoskeletal pain 07/09/2016   Severe obesity (BMI 35.0-39.9) with comorbidity (HCC) 12/04/2015   Chronic knee pain (S/P TKR) (Right) 12/04/2015   Chronic knee pain (Left) 12/04/2015   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) (Right) 12/04/2015   Osteoarthritis of knee (Left) 12/04/2015   Carpal tunnel syndrome 11/23/2015   Depression 11/23/2015   Fibromyalgia 11/23/2015   GERD (gastroesophageal reflux disease) 11/23/2015   History of dysphagia 11/23/2015   Hypothyroidism 11/23/2015   Hyperlipidemia 11/23/2015   Hypertension 11/23/2015   Migraine headache 11/23/2015   Osteoarthritis (arthritis due to wear and tear of joints) 11/23/2015   Chronic low back pain (Location of Tertiary source of pain) (Bilateral) (L>R) 11/23/2015   Chronic lower extremity pain (Bilateral) (L>R) 11/23/2015   Chronic knee pain (Location of Primary Source of Pain) (Bilateral) (L>R) 11/23/2015   Chronic hip pain (Location of Secondary source of pain) (Bilateral) (L>R) 11/23/2015   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)  11/23/2015   Lumbar facet syndrome (Bilateral) (L>R) 11/23/2015   Chronic sacroiliac joint pain (Bilateral) (L>R) 11/23/2015   Long term current use of opiate analgesic 11/23/2015   Long term prescription opiate use 11/23/2015   Opiate use 11/23/2015   Long term current use of non-steroidal anti-inflammatories (NSAID) 11/23/2015   Disturbance of skin sensation 11/23/2015   Diverticulosis 11/23/2015   Neurogenic pain 11/23/2015   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) 11/23/2015   Encounter for pain management planning 11/23/2015   Encounter for therapeutic drug level monitoring 11/23/2015   Chronic neck pain 11/23/2015   Lumbar spondylosis 11/23/2015   Cervical spondylosis 11/23/2015   History of TKR (total knee replacement) (Right) 11/23/2015   Anxiety 08/25/2013   Neuralgia 01/25/2013   No transfusions per religious beliefs 02/27/2012    Past Surgical History:  Procedure Laterality Date   ABDOMINAL HYSTERECTOMY     BREAST CYST ASPIRATION Right +/- 10 yrs ago   x 2 neg   CHOLECYSTECTOMY     COLON SURGERY     COLONOSCOPY  09/08/2006   COLONOSCOPY WITH PROPOFOL N/A 07/16/2017   Procedure: COLONOSCOPY WITH PROPOFOL;  Surgeon: Scot Jun, MD;  Location: [facility] END[facility]COPY;  Service: Endoscopy;  Laterality: N/A;   ESOPHAGOGASTRODUODENOSCOPY  01/24/2009   HERNIA REPAIR     JOINT REPLACEMENT Right    knee 2015   THYROIDECTOMY      OB History   No obstetric history on file.      Home Medications    Prior to Admission medications   Medication Sig Start Date End Date Taking? Authorizing Provider  traMADol (ULTRAM) 50 MG tablet Take 1 tablet (50 mg total) by mouth every 8 (eight) hours as needed for up to 5 days. 01/13/21 01/18/21 Yes Shirlee Latch, PA-C  aspirin EC 325 MG EC tablet Take 1 tablet (325 mg total) by mouth daily. 10/27/20   Marrion Coy, MD  conjugated estrogens (PREMARIN) vaginal cream Estrogen Cream Instruction Discard applicator Apply pea sized amount to tip of finger to urethra before bed. Wash hands well after application. Use Monday, Wednesday and Friday 11/15/20   Sondra Come, MD  Cranberry 500 MG CAPS Take by mouth.    [provider]  [employer] D3 5000 units capsule TAKE 5,000 UNITS BY MOUTH ONCE DAILY. 10/10/15   [provider]  famotidine (PEPCID) 40 MG tablet Take 40 mg by mouth at bedtime. 04/19/19   [provider]  fluticasone (FLONASE) 50 MCG/ACT nasal spray Place into the  nose. 10/28/19 10/27/20  [provider]  gabapentin (NEURONTIN) 400 MG capsule Take 400 mg by mouth 3 (three) times daily.  09/23/15   [provider]  losartan (COZAAR) 25 MG tablet Take 25 mg by mouth daily. 04/29/19   [provider]  methenamine (HIPREX) 1 g tablet Take 1 tablet (1 g total) by mouth in the morning and at bedtime. 11/15/20   Sondra Come, MD  metoprolol tartrate (LOPRESSOR) 25 MG tablet Take 1 tablet (25 mg total) by mouth 2 (two) times daily. 10/26/20   Marrion Coy, MD  Misc Natural Products (GLUCOSAMINE CHONDROITIN ADV PO) Take by mouth.    [provider]  omega-3 fish oil (MAXEPA) 1000 MG CAPS capsule Take by mouth.    [provider]  oxybutynin (DITROPAN-XL) 10 MG 24 hr tablet TAKE 1 TABLET BY MOUTH EVERY DAY 11/25/19   McGowan, Carollee Herter A, PA-C  PARoxetine (PAXIL) 40 MG tablet Take 40 mg by mouth daily.  09/11/15   [provider]  simvastatin (ZOCOR) 20 MG tablet Take by mouth 2 (two) times daily.  05/09/15   [provider]  Vitamin E 180 MG (400 UNIT) CAPS Take by mouth.    [provider]    Family History Family History  Problem Relation Age of Onset   Arthritis Mother    Arthritis Father    COPD Father    Arthritis Sister    Diabetes Sister    Cancer Brother    COPD Brother    Cancer Son    Drug abuse Son    Stroke Maternal Grandmother    Arthritis Paternal Grandfather    Breast cancer Neg Hx     Social History Social History   Tobacco Use   Smoking status: Former    Types: Cigarettes    Quit date: 02/26/1982    Years since quitting: 38.9   Smokeless tobacco: Never  Vaping Use   Vaping Use: Never used  Substance Use Topics   Alcohol use: No   Drug use: No     Allergies   Lac bovis, Milk-related compounds, Peanut oil, Peanut-containing drug products, and Demerol [meperidine]   Review of Systems Review of Systems  Constitutional:  Positive for fatigue.  Respiratory:   Negative for cough, choking and shortness of breath.   Cardiovascular:  Negative for chest pain.  Gastrointestinal:  Positive for abdominal pain. Negative for blood in stool and vomiting.  Genitourinary:  Negative for hematuria.  Musculoskeletal:  Positive for arthralgias (rib pain).  Skin:  Positive for color change. Negative for wound.  Neurological:  Negative for dizziness, syncope, weakness and headaches.    Physical Exam Triage Vital Signs ED Triage Vitals  Enc Vitals Group     BP 01/13/21 1412 139/60     Pulse Rate 01/13/21 1412 60     Resp 01/13/21 1412 18     Temp 01/13/21 1412 98.3 F (36.8 C)     Temp Source 01/13/21 1412 Oral     SpO2 01/13/21 1412 97 %     Weight 01/13/21 1410 191 lb (86.6 kg)     Height 01/13/21 1410 5\' 1"  (1.549 m)     Head Circumference --      Peak Flow --      Pain Score 01/13/21 1410 7     Pain Loc --      Pain Edu? --      Excl. in GC? --    No data found.  Updated Vital Signs BP 139/60 (BP Location: Left Arm)   Pulse 60   Temp 98.3 F (36.8 C) (Oral)   Resp 18   Ht 5\' 1"  (1.549 m)   Wt 191 lb (86.6 kg)   SpO2 97%   BMI 36.09 kg/m       Physical Exam Vitals and nursing note reviewed.  Constitutional:      General: She is not in acute distress.    Appearance: Normal appearance. She is not ill-appearing or toxic-appearing.  HENT:     Head: Normocephalic and atraumatic.     Nose: Nose normal.     Mouth/Throat:     Mouth: Mucous membranes are moist.     Pharynx: Oropharynx is clear.  Eyes:     General: No scleral icterus.       Right eye: No discharge.        Left eye: No discharge.     Extraocular Movements: Extraocular movements intact.  Conjunctiva/sclera: Conjunctivae normal.     Pupils: Pupils are equal, round, and reactive to light.  Cardiovascular:     Rate and Rhythm: Normal rate and regular rhythm.     Heart sounds: Normal heart sounds.  Pulmonary:     Effort: Pulmonary effort is normal. No respiratory  distress.     Breath sounds: Normal breath sounds.  Chest:     Chest wall: Tenderness (TTP along anterior and lateral 9th and 10th ribs) present.  Abdominal:     Palpations: Abdomen is soft.     Tenderness: There is abdominal tenderness (generalized, worse of RUQ).  Musculoskeletal:     Cervical back: Neck supple.  Skin:    General: Skin is dry.     Findings: Bruising (large area of ecchymosis and hematoma of right lateral ribs/flank) present.  Neurological:     General: No focal deficit present.     Mental Status: She is alert. Mental status is at baseline.     Motor: No weakness.     Gait: Gait abnormal (Patient sitting in wheelchair).  Psychiatric:        Mood and Affect: Mood normal.        Behavior: Behavior normal.        Thought Content: Thought content normal.     UC Treatments / Results  Labs (all labs ordered are listed, but only abnormal results are displayed) Labs Reviewed - No data to display  EKG   Radiology DG Ribs Unilateral W/Chest Right  Result Date: 01/13/2021 CLINICAL DATA:  Right-sided rib pain after fall 2 days ago. EXAM: RIGHT RIBS AND CHEST - 3+ VIEW COMPARISON:  CT a chest, abdomen, and pelvis dated October 25, 2020. Chest x-ray dated June 20, 2019. FINDINGS: Acute nondisplaced fractures of the right lateral ninth and tenth ribs. There is no evidence of pneumothorax or pleural effusion. Both lungs are clear. Heart size and mediastinal contours are within normal limits. IMPRESSION: 1. Acute nondisplaced fractures of the right lateral ninth and tenth ribs. No pneumothorax. Electronically Signed   By: Obie Dredge M.D.   On: 01/13/2021 15:19   DG Abdomen 1 View  Result Date: 01/13/2021 CLINICAL DATA:  Abdominal pain.  Fall 2 days ago. EXAM: ABDOMEN - 1 VIEW COMPARISON:  Abdominal x-ray dated November 12, 2018. FINDINGS: The bowel gas pattern is normal. Large amount of stool in the colon. Postsurgical changes in the left abdomen from prior bowel resection.  Prior cholecystectomy. No radio-opaque calculi or other significant radiographic abnormality are seen. IMPRESSION: 1. No acute findings. 2. Large colonic stool burden. Electronically Signed   By: Obie Dredge M.D.   On: 01/13/2021 15:20    Procedures Procedures (including critical care time)  Medications Ordered in UC Medications - No data to display  Initial Impression / Assessment and Plan / UC Course  I have reviewed the triage vital signs and the nursing notes.  Pertinent labs & imaging results that were available during my care of the patient were reviewed by me and considered in my medical decision making (see chart for details).  80 year old female presenting for right-sided rib pain and abdominal pain as well as a large bruise of the right side following an accidental fall 2 days ago.  Patient has taken Tylenol for pain without relief.  Vitals are stable.  Patient does appear uncomfortable but not ill or in distress.  She does have a large area of ecchymosis and hematoma of the right lateral rib/flank.  On exam she  has diffuse tenderness along the anterior and lateral ninth and 10th ribs.  No step-offs noted.  Chest is clear to auscultation and heart regular rate and rhythm.  She does have mild diffuse abdominal tenderness which is worse of the right upper quadrant.  X-rays of right ribs and chest as well as KUB ordered.  X-rays independently viewed by me.  X-ray does show nondisplaced fractures of the ninth and 10th ribs.  Also she has large amount of stool in colon.  Reviewed all results with patient.  Reviewed care for rib fractures.  Advised patient care is largely supportive.  Patient given abdominal binder.  I have sent in Ultram for patient and advised her to be very careful using this as it can make her little drowsy and more prone to falls.  I have advised her to make sure she is using her walker and to follow-up with her PCP regarding her more frequent falls.  Patient  understands the risks of taking this medication.  She plans to only use if absolutely needed.  Also reviewed cryotherapy and use of lidocaine patches.  Patient knows to call EMS or go to ED for any acute worsening of symptoms such as if she has increased abdominal pain, dark or bloody stools, blood in urine, fatigue/lethargy, chest pain, breathing difficulty, severe headache, vision changes, vomiting, etc.  Otherwise she knows to follow-up with her primary care provider.   Final Clinical Impressions(s) / UC Diagnoses   Final diagnoses:  Closed fracture of multiple ribs of right side, initial encounter  Pain of upper abdomen  Hematoma  Fall, initial encounter     Discharge Instructions      -Your x-ray shows that you have 2 rib fractures.  They are not displaced and your lung has not been punctured.  Your x-ray of your abdomen shows some constipation but is otherwise normal and reassuring. -We have given you an abdominal binder to help with some compression on the area. -Advise you ice the area of a couple of hours -Tylenol for pain relief but if that does not help then you can take the tramadol.  Please be very careful with this medication and make sure you are using your walker getting up very slowly when taking this since you already have balance issues. -Make an appointment with your primary care provider to discuss your increased problems with your balance. -If you develop a fever, cough or increased breathing difficulty, please call 911 or go to emergency department.   ED Prescriptions     Medication Sig Dispense Auth. Provider   traMADol (ULTRAM) 50 MG tablet Take 1 tablet (50 mg total) by mouth every 8 (eight) hours as needed for up to 5 days. 15 tablet Shirlee Latch, PA-C      I have reviewed the PDMP during this encounter.   Shirlee Latch, PA-C 01/14/21 [phone]

## 2021-01-13 NOTE — Discharge Instructions (Signed)
-  Your x-ray shows that you have 2 rib fractures.  They are not displaced and your lung has not been punctured.  Your x-ray of your abdomen shows some constipation but is otherwise normal and reassuring. -We have given you an abdominal binder to help with some compression on the area. -Advise you ice the area of a couple of hours -Tylenol for pain relief but if that does not help then you can take the tramadol.  Please be very careful with this medication and make sure you are using your walker getting up very slowly when taking this since you already have balance issues. -Make an appointment with your primary care provider to discuss your increased problems with your balance. -If you develop a fever, cough or increased breathing difficulty, please call 911 or go to emergency department.

## 2021-01-13 NOTE — ED Triage Notes (Signed)
Pt states 2 episodes of losing her balance over past week and falling. 1st episode fell onto left side and "wasn't too bad." 2nd episode was 2 nights ago and fell against the base of a wooden chair and struck her right side. Significant bruising over right ribs. Pain to the area and now radiating to the front of her abdominal area.

## 2021-02-06 NOTE — Progress Notes (Deleted)
02/07/2021 4:58 PM   Alice [name] [birth year] [id number]  Referring provider: Su Monks, PA [address] [place],  [place] [address]  No chief complaint on file.  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 OAB -contributing factors of age, vaginal atrophy, depression, arthritis, fecal incontinence, pelvic surgery, obesity and vaginal delivery, a family history of incontinence, history of smoking, OAB medication and antidepressants -PVR *** -managed with oxybutynin XL 10 mg daily  HPI: Alice [name] is a 80 y.o. female who presents today for leakage, frequency and possible UTI.  UA ***  PVR ***   PMH: Past Medical History:  Diagnosis Date   Anxiety    Arthritis    Depression    Fibromyalgia    GERD (gastroesophageal reflux disease)    History of carpal tunnel syndrome    History of diverticulitis    History of dysphagia    History of vertigo    Hypercholesteremia    Hypertension    Hypothyroidism    Migraine    Neuralgia 2014   Obesity    Polymyalgia rheumatica (HCC)     Surgical History: Past Surgical History:  Procedure Laterality Date   ABDOMINAL HYSTERECTOMY     BREAST CYST ASPIRATION Right +/- 10 yrs ago   x 2 neg   CHOLECYSTECTOMY     COLON SURGERY     COLONOSCOPY  09/08/2006   COLONOSCOPY WITH PROPOFOL N/A 07/16/2017   Procedure: COLONOSCOPY WITH PROPOFOL;  Surgeon: Scot Jun, MD;  Location: [facility] ENDOSCOPY;  Service: Endoscopy;  Laterality: N/A;   ESOPHAGOGASTRODUODENOSCOPY  01/24/2009   HERNIA REPAIR     JOINT REPLACEMENT Right    knee 2015   THYROIDECTOMY      Home Medications:  Allergies as of 02/07/2021       Reactions   Lac Bovis Other (See  Comments)   Lactose Intolerance, headache LactoseIntolerance Lactose Intolerance, headache   Milk-related Compounds    Peanut Oil Other (See Comments)   Headache Headache   Peanut-containing Drug Products    Demerol [meperidine] Palpitations, Other (See Comments)   RAYS COMES OUT OF EYES        Medication List        Accurate as of February 06, 2021  4:58 PM. If you have any questions, ask your nurse or doctor.          aspirin 325 MG EC tablet Take 1 tablet (325 mg total) by mouth daily.   Cranberry 500 MG Caps Take by mouth.   CVS D3 125 MCG (5000 UT) capsule Generic drug: Cholecalciferol TAKE 5,000 UNITS BY MOUTH ONCE DAILY.   famotidine 40 MG tablet Commonly known as: PEPCID Take 40 mg by mouth at bedtime.   fluticasone 50 MCG/ACT nasal spray Commonly known as: FLONASE Place into the nose.   gabapentin 400 MG capsule Commonly known as: NEURONTIN Take 400 mg by mouth 3 (three) times daily.   GLUCOSAMINE CHONDROITIN ADV PO Take by mouth.   losartan 25 MG tablet Commonly known as: COZAAR Take 25 mg by mouth daily.   methenamine 1 g tablet Commonly known as: HIPREX Take 1 tablet (1 g total) by mouth in the morning and at bedtime.   metoprolol tartrate 25 MG tablet Commonly known as: LOPRESSOR  Take 1 tablet (25 mg total) by mouth 2 (two) times daily.   omega-3 fish oil 1000 MG Caps capsule Commonly known as: MAXEPA Take by mouth.   oxybutynin 10 MG 24 hr tablet Commonly known as: DITROPAN-XL TAKE 1 TABLET BY MOUTH EVERY DAY   PARoxetine 40 MG tablet Commonly known as: PAXIL Take 40 mg by mouth daily.   Premarin vaginal cream Generic drug: conjugated estrogens Estrogen Cream Instruction Discard applicator Apply pea sized amount to tip of finger to urethra before bed. Wash hands well after application. Use Monday, Wednesday and Friday   simvastatin 20 MG tablet Commonly known as: ZOCOR Take by mouth 2 (two) times daily.   Vitamin E 180 MG  (400 UNIT) Caps Take by mouth.        Allergies:  Allergies  Allergen Reactions   Lac Bovis Other (See Comments)    Lactose Intolerance, headache LactoseIntolerance Lactose Intolerance, headache    Milk-Related Compounds    Peanut Oil Other (See Comments)    Headache Headache    Peanut-Containing Drug Products    Demerol [Meperidine] Palpitations and Other (See Comments)    RAYS COMES OUT OF EYES    Family History: Family History  Problem Relation Age of Onset   Arthritis Mother    Arthritis Father    COPD Father    Arthritis Sister    Diabetes Sister    Cancer Brother    COPD Brother    Cancer Son    Drug abuse Son    Stroke Maternal Grandmother    Arthritis Paternal Grandfather    Breast cancer Neg Hx     Social History:  reports that she quit smoking about 38 years ago. Her smoking use included cigarettes. She has never used smokeless tobacco. She reports that she does not drink alcohol and does not use drugs.  ROS: Pertinent ROS in HPI  Physical Exam: There were no vitals taken for this visit.  Constitutional:  Well nourished. Alert and oriented, No acute distress. HEENT: [place] AT, moist mucus membranes.  Trachea midline, no masses. Cardiovascular: No clubbing, cyanosis, or edema. Respiratory: Normal respiratory effort, no increased work of breathing. GI: Abdomen is soft, non tender, non distended, no abdominal masses. Liver and spleen not palpable.  No hernias appreciated.  Stool sample for occult testing is not indicated.   GU: No CVA tenderness.  No bladder fullness or masses.  *** external genitalia, *** pubic hair distribution, no lesions.  Normal urethral meatus, no lesions, no prolapse, no discharge.   No urethral masses, tenderness and/or tenderness. No bladder fullness, tenderness or masses. *** vagina mucosa, *** estrogen effect, no discharge, no lesions, *** pelvic support, *** cystocele and *** rectocele noted.  No cervical motion tenderness.  Uterus  is freely mobile and non-fixed.  No adnexal/parametria masses or tenderness noted.  Anus and perineum are without rashes or lesions.   ***  Skin: No rashes, bruises or suspicious lesions. Lymph: No cervical or inguinal adenopathy. Neurologic: Grossly intact, no focal deficits, moving all 4 extremities. Psychiatric: Normal mood and affect.    Laboratory Data: Lab Results  Component Value Date   WBC 13.2 (H) 10/26/2020   HGB 14.8 10/26/2020   HCT 41.3 10/26/2020   MCV 91.0 10/26/2020   PLT 197 10/26/2020    Lab Results  Component Value Date   CREATININE 0.53 10/26/2020    Lab Results  Component Value Date   TSH 0.177 (L) 10/25/2020    Lab Results  Component  Value Date   AST 25 10/25/2020   Lab Results  Component Value Date   ALT 19 10/25/2020    Urinalysis ***   I have reviewed the labs.   Pertinent Imaging: ***   Assessment & Plan:  ***  1. Frequency ***  2. Incontinence ***  No follow-ups on file.  These notes generated with voice recognition software. I apologize for typographical errors.  Michiel Cowboy, PA-C  [facility] [address]  [address] [place], [place] [address] [phone]

## 2021-02-07 ENCOUNTER — Ambulatory Visit: Payer: Medicare HMO | Admitting: Urology

## 2021-02-07 DIAGNOSIS — N3946 Mixed incontinence: Secondary | ICD-10-CM

## 2021-02-07 DIAGNOSIS — R350 Frequency of micturition: Secondary | ICD-10-CM

## 2021-04-11 ENCOUNTER — Other Ambulatory Visit: Payer: Self-pay | Admitting: Family Medicine

## 2021-05-07 ENCOUNTER — Other Ambulatory Visit: Payer: Self-pay | Admitting: Family Medicine

## 2021-05-07 DIAGNOSIS — E890 Postprocedural hypothyroidism: Secondary | ICD-10-CM

## 2021-05-17 ENCOUNTER — Other Ambulatory Visit: Payer: Self-pay | Admitting: Family Medicine

## 2021-05-17 ENCOUNTER — Other Ambulatory Visit: Payer: Self-pay | Admitting: Physician Assistant

## 2021-05-17 DIAGNOSIS — Z1231 Encounter for screening mammogram for malignant neoplasm of breast: Secondary | ICD-10-CM

## 2021-05-23 ENCOUNTER — Ambulatory Visit: Payer: Medicare HMO

## 2021-05-23 ENCOUNTER — Ambulatory Visit: Payer: Self-pay | Admitting: Urology

## 2021-05-29 ENCOUNTER — Other Ambulatory Visit: Payer: Self-pay | Admitting: Family Medicine

## 2021-05-29 DIAGNOSIS — N644 Mastodynia: Secondary | ICD-10-CM

## 2021-06-11 ENCOUNTER — Other Ambulatory Visit: Payer: Self-pay

## 2021-06-11 ENCOUNTER — Ambulatory Visit
Admission: RE | Admit: 2021-06-11 | Discharge: 2021-06-11 | Disposition: A | Discharge status: Home / Self Care | Payer: Medicare HMO | Source: Ambulatory Visit | Attending: Family Medicine | Admitting: Family Medicine

## 2021-06-11 ENCOUNTER — Ambulatory Visit: Payer: Medicare HMO

## 2021-06-11 DIAGNOSIS — N644 Mastodynia: Secondary | ICD-10-CM | POA: Diagnosis not present

## 2021-08-26 ENCOUNTER — Ambulatory Visit
Admission: RE | Admit: 2021-08-26 | Discharge: 2021-08-26 | Disposition: A | Discharge status: Home / Self Care | Payer: Medicare HMO | Source: Ambulatory Visit | Attending: Orthopedic Surgery | Admitting: Orthopedic Surgery

## 2021-08-26 ENCOUNTER — Other Ambulatory Visit: Payer: Self-pay | Admitting: Student

## 2021-08-26 ENCOUNTER — Ambulatory Visit
Admission: RE | Admit: 2021-08-26 | Discharge: 2021-08-26 | Disposition: A | Discharge status: Home / Self Care | Payer: Medicare HMO | Source: Ambulatory Visit | Attending: Student | Admitting: Student

## 2021-08-26 DIAGNOSIS — M79605 Pain in left leg: Secondary | ICD-10-CM | POA: Diagnosis present

## 2021-10-01 ENCOUNTER — Other Ambulatory Visit: Payer: Self-pay | Admitting: Urology

## 2021-10-01 DIAGNOSIS — N390 Urinary tract infection, site not specified: Secondary | ICD-10-CM

## 2021-10-18 ENCOUNTER — Emergency Department: Payer: Medicare HMO

## 2021-10-18 ENCOUNTER — Other Ambulatory Visit: Payer: Self-pay

## 2021-10-18 ENCOUNTER — Emergency Department
Admission: EM | Admit: 2021-10-18 | Discharge: 2021-10-18 | Disposition: A | Discharge status: Home / Self Care | Payer: Medicare HMO | Attending: Emergency Medicine | Admitting: Emergency Medicine

## 2021-10-18 DIAGNOSIS — M546 Pain in thoracic spine: Secondary | ICD-10-CM | POA: Insufficient documentation

## 2021-10-18 DIAGNOSIS — S199XXA Unspecified injury of neck, initial encounter: Secondary | ICD-10-CM | POA: Insufficient documentation

## 2021-10-18 DIAGNOSIS — U071 COVID-19: Secondary | ICD-10-CM | POA: Insufficient documentation

## 2021-10-18 DIAGNOSIS — M791 Myalgia, unspecified site: Secondary | ICD-10-CM | POA: Insufficient documentation

## 2021-10-18 DIAGNOSIS — R079 Chest pain, unspecified: Secondary | ICD-10-CM | POA: Diagnosis not present

## 2021-10-18 DIAGNOSIS — Y92410 Unspecified street and highway as the place of occurrence of the external cause: Secondary | ICD-10-CM | POA: Insufficient documentation

## 2021-10-18 DIAGNOSIS — M542 Cervicalgia: Secondary | ICD-10-CM | POA: Insufficient documentation

## 2021-10-18 DIAGNOSIS — M7918 Myalgia, other site: Secondary | ICD-10-CM

## 2021-10-18 LAB — URINALYSIS, ROUTINE W REFLEX MICROSCOPIC
Bilirubin Urine: NEGATIVE
Glucose, UA: NEGATIVE mg/dL
Hgb urine dipstick: NEGATIVE
Ketones, ur: NEGATIVE mg/dL
Nitrite: NEGATIVE
Protein, ur: NEGATIVE mg/dL
Specific Gravity, Urine: 1.012 (ref 1.005–1.030)
pH: 6 (ref 5.0–8.0)

## 2021-10-18 LAB — SARS CORONAVIRUS 2 BY RT PCR: SARS Coronavirus 2 by RT PCR: POSITIVE — AB

## 2021-10-18 MED ORDER — NIRMATRELVIR/RITONAVIR (PAXLOVID)TABLET: 3.0000 | ORAL_TABLET | Freq: Two times a day (BID) | ORAL | 0 refills | Status: AC

## 2021-10-18 MED ORDER — LIDOCAINE 5 % EX PTCH
1.0000 | MEDICATED_PATCH | Freq: Once | CUTANEOUS | Status: DC
  Administered 2021-10-18: 1 via TRANSDERMAL
  Filled 2021-10-18: qty 1

## 2021-10-18 MED ORDER — TRAMADOL HCL 50 MG PO TABS
50.0000 mg | ORAL_TABLET | Freq: Once | ORAL | Status: AC
  Administered 2021-10-18: 50 mg via ORAL
  Filled 2021-10-18: qty 1

## 2021-10-18 MED ORDER — ACETAMINOPHEN 500 MG PO TABS
1000.0000 mg | ORAL_TABLET | Freq: Once | ORAL | Status: AC
  Administered 2021-10-18: 1000 mg via ORAL
  Filled 2021-10-18: qty 2
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
## 2021-10-18 NOTE — Discharge Instructions (Addendum)
Your exam, labs, X-rays, and CT scans are normal and reassuring. You have been confirmed to have Covid. Take the Paxlovid as directed. Follow-up with your provider for continued symptoms. Return if needed.
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
## 2021-10-18 NOTE — ED Notes (Signed)
Dr. Katrinka Blazing at bedside to clear pt. C-spine and collar. Pt. Cleared. Pt. Up to bathroom. Ambulation slow and steady. NAD.

## 2021-10-18 NOTE — ED Notes (Signed)
Xray asking if they can adjust c collar for xray because on incorrectly. Passed along msg to provider who will check c collar.

## 2022-06-04 ENCOUNTER — Other Ambulatory Visit: Payer: Self-pay

## 2022-06-04 ENCOUNTER — Ambulatory Visit
Admission: EM | Admit: 2022-06-04 | Discharge: 2022-06-04 | Disposition: A | Discharge status: Home / Self Care | Payer: 59 | Attending: Family Medicine | Admitting: Family Medicine

## 2022-06-04 DIAGNOSIS — H1131 Conjunctival hemorrhage, right eye: Secondary | ICD-10-CM

## 2022-06-04 DIAGNOSIS — S0501XA Injury of conjunctiva and corneal abrasion without foreign body, right eye, initial encounter: Secondary | ICD-10-CM | POA: Diagnosis not present

## 2022-06-04 MED ORDER — ERYTHROMYCIN 5 MG/GM OP OINT: TOPICAL_OINTMENT | Freq: Three times a day (TID) | OPHTHALMIC | 0 refills | Status: DC

## 2022-06-04 MED ORDER — ERYTHROMYCIN 5 MG/GM OP OINT: TOPICAL_OINTMENT | Freq: Three times a day (TID) | OPHTHALMIC | Status: DC

## 2022-06-04 NOTE — ED Provider Notes (Signed)
[facility]    CSN: [id number] Arrival date & time: 06/04/22  1313      History   Chief Complaint No chief complaint on file.   HPI HPI  Alice [name] is a 82 y.o. female.    Alice [name] presents for right eye redness. She has a floater in her eye that has been there for months. Her eye didn't really hurt. In the past 3-4 days, her husband told her that her eye was blood shot. There is no pain.   She has no trouble seeing. There has been no drainage or discharge from the eye. They have been putting up some insulation in the home. She doesn't think anything got into her eye. She doesn't feel anything scratching her.  No treatments prior to arrival.   Alice [name] does wear glasses.  She tried to make an appointment with her eye doctor but he wasn't there and there was no signs up to say why there was no one there.  Alice [name] has otherwise been well and has no additional concerns today.    Past Medical History:  Diagnosis Date   Anxiety    Arthritis    Depression    Fibromyalgia    GERD (gastroesophageal reflux disease)    History of carpal tunnel syndrome    History of diverticulitis    History of dysphagia    History of vertigo    Hypercholesteremia    Hypertension    Hypothyroidism    Migraine    Neuralgia 2014   Obesity    Polymyalgia rheumatica ([place])     Patient Active Problem List   Diagnosis Date Noted   Hypertensive emergency 10/26/2020   Elevated troponin 10/26/2020   Chest pain 10/25/2020   Stage 3a chronic kidney disease ([place]) 10/29/2019   Lumbar radiculopathy 07/08/2019   Abnormal gait 06/17/2018   Displacement of lumbar intervertebral disc without myelopathy 06/17/2018   Impingement syndrome of shoulder region 06/17/2018   Knee stiff 06/17/2018   Mild cognitive impairment 07/03/2017   Generalized abdominal pain 05/08/2017   Hx of adenomatous colonic polyps 05/08/2017   Increased frequency of urination 05/08/2017   Urinary and fecal  incontinence 05/08/2017   Chronic pain syndrome 07/09/2016   Musculoskeletal pain 07/09/2016   Severe obesity (BMI 35.0-39.9) with comorbidity ([place]) 12/04/2015   Chronic knee pain (S/P TKR) (Right) 12/04/2015   Chronic knee pain (Left) 12/04/2015   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) (Right) 12/04/2015   Osteoarthritis of knee (Left) 12/04/2015   Carpal tunnel syndrome 11/23/2015   Depression 11/23/2015   Fibromyalgia 11/23/2015   GERD (gastroesophageal reflux disease) 11/23/2015   History of dysphagia 11/23/2015   Hypothyroidism 11/23/2015   Hyperlipidemia 11/23/2015   Hypertension 11/23/2015   Migraine headache 11/23/2015   Osteoarthritis (arthritis due to wear and tear of joints) 11/23/2015   Chronic low back pain (Location of Tertiary source of pain) (Bilateral) (L>R) 11/23/2015   Chronic lower extremity pain (Bilateral) (L>R) 11/23/2015   Chronic knee pain (Location of Primary Source of Pain) (Bilateral) (L>R) 11/23/2015   Chronic hip pain (Location of Secondary source of pain) (Bilateral) (L>R) 11/23/2015   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) 11/23/2015   Lumbar facet syndrome (Bilateral) (L>R) 11/23/2015   Chronic sacroiliac joint pain (Bilateral) (L>R) 11/23/2015   Long term current use of opiate analgesic 11/23/2015   Long term prescription opiate use 11/23/2015   Opiate use 11/23/2015   Long term current use of non-steroidal anti-inflammatories (NSAID) 11/23/2015  Disturbance of skin sensation 11/23/2015   Diverticulosis 11/23/2015   Neurogenic pain 11/23/2015   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) 11/23/2015   Encounter for pain management planning 11/23/2015   Encounter for therapeutic drug level monitoring 11/23/2015   Chronic neck pain 11/23/2015   Lumbar spondylosis 11/23/2015   Cervical spondylosis 11/23/2015   History of TKR (total knee replacement) (Right) 11/23/2015   Anxiety 08/25/2013   Neuralgia 01/25/2013   No  transfusions per religious beliefs 02/27/2012    Past Surgical History:  Procedure Laterality Date   ABDOMINAL HYSTERECTOMY     BREAST CYST ASPIRATION Right +/- 10 yrs ago   x 2 neg   CHOLECYSTECTOMY     COLON SURGERY     COLONOSCOPY  09/08/2006   COLONOSCOPY WITH PROPOFOL N/A 07/16/2017   Procedure: COLONOSCOPY WITH PROPOFOL;  Surgeon: Manya Silvas, MD;  Location: [facility] ENDOSCOPY;  Service: Endoscopy;  Laterality: N/A;   ESOPHAGOGASTRODUODENOSCOPY  01/24/2009   HERNIA REPAIR     JOINT REPLACEMENT Right    knee 2015   THYROIDECTOMY      OB History   No obstetric history on file.      Home Medications    Prior to Admission medications   Medication Sig Start Date End Date Taking? Authorizing Provider  erythromycin ophthalmic ointment Place into the right eye 3 (three) times daily. Place a 1/2 inch ribbon of ointment into the lower eyelid for 5 days 06/04/22  Yes Serai Tukes, DO  aspirin EC 325 MG EC tablet Take 1 tablet (325 mg total) by mouth daily. 10/27/20   Sharen Hones, MD  conjugated estrogens (PREMARIN) vaginal cream Estrogen Cream Instruction Discard applicator Apply pea sized amount to tip of finger to urethra before bed. Wash hands well after application. Use Monday, Wednesday and Friday 11/15/20   Billey Co, MD  Cranberry 500 MG CAPS Take by mouth.    [provider]  [employer] D3 5000 units capsule TAKE 5,000 UNITS BY MOUTH ONCE DAILY. 10/10/15   [provider]  famotidine (PEPCID) 40 MG tablet Take 40 mg by mouth at bedtime. 04/19/19   [provider]  fluticasone (FLONASE) 50 MCG/ACT nasal spray Place into the nose. 10/28/19 10/27/20  [provider]  gabapentin (NEURONTIN) 400 MG capsule Take 400 mg by mouth 3 (three) times daily.  09/23/15   [provider]  losartan (COZAAR) 25 MG tablet Take 25 mg by mouth daily. 04/29/19   [provider]  methenamine (HIPREX) 1 g tablet Take 1 tablet (1 g total) by mouth in  the morning and at bedtime. 11/15/20   Billey Co, MD  metoprolol tartrate (LOPRESSOR) 25 MG tablet Take 1 tablet (25 mg total) by mouth 2 (two) times daily. 10/26/20   Sharen Hones, MD  Misc Natural Products (GLUCOSAMINE CHONDROITIN ADV PO) Take by mouth.    [provider]  omega-3 fish oil (MAXEPA) 1000 MG CAPS capsule Take by mouth.    [provider]  oxybutynin (DITROPAN-XL) 10 MG 24 hr tablet TAKE 1 TABLET BY MOUTH EVERY DAY 11/25/19   McGowan, Larene Beach A, PA-C  PARoxetine (PAXIL) 40 MG tablet Take 40 mg by mouth daily. 09/11/15   [provider]  simvastatin (ZOCOR) 20 MG tablet Take by mouth 2 (two) times daily.  05/09/15   [provider]  Vitamin E 180 MG (400 UNIT) CAPS Take by mouth.    [provider]    Family History Family History  Problem Relation  Age of Onset   Arthritis Mother    Arthritis Father    COPD Father    Arthritis Sister    Diabetes Sister    Cancer Brother    COPD Brother    Cancer Son    Drug abuse Son    Stroke Maternal Grandmother    Arthritis Paternal Grandfather    Breast cancer Neg Hx     Social History Social History   Tobacco Use   Smoking status: Former    Types: Cigarettes    Quit date: 02/26/1982    Years since quitting: 40.3   Smokeless tobacco: Never  Vaping Use   Vaping Use: Never used  Substance Use Topics   Alcohol use: No   Drug use: No     Allergies   Milk (cow), Milk-related compounds, Peanut oil, Peanut-containing drug products, and Demerol [meperidine]   Review of Systems Review of Systems : negative unless otherwise stated in HPI.      Physical Exam Triage Vital Signs ED Triage Vitals [06/04/22 1333]  Enc Vitals Group     BP      Pulse      Resp      Temp      Temp src      SpO2      Weight      Height      Head Circumference      Peak Flow      Pain Score 1     Pain Loc      Pain Edu?      Excl. in [place]?    No data found.  Updated Vital Signs BP  (!) 143/82   Pulse 60   Temp 98.2 F (36.8 C)   Resp 18   SpO2 99%   Visual Acuity     Without glasses Right 20/70 Left 20/40   With Glasses Right 20/50 Left 20/70   With glasses both eyes 20/40    Right Eye Near:   Left Eye Near:    Bilateral Near:     Physical Exam  GEN: pleasant well appearing elderly female, in no acute distress  NECK: normal ROM  CV: regular rate  RESP: no increased work of breathing EYES:     General: Lids are normal. Lids are everted, no foreign bodies appreciated. Vision grossly intact. Gaze aligned appropriately.        Right eye: No foreign body, Hordeolum    Left eye: No foreign body, discharge or hordeolum.     Extraocular Movements: Extraocular movements intact.     Conjunctiva/sclera:     Left eye: right conjunctiva is injected. No chemosis. Medial subconjunctival hemorrhage.    Comments: fluorescein stain performed, Corneal abrasions in the 5 through 8 o'clock position; inspected for foreign bodies  SKIN: warm and dry   UC Treatments / Results  Labs (all labs ordered are listed, but only abnormal results are displayed) Labs Reviewed - No data to display  EKG   Radiology No results found.  Procedures Procedures (including critical care time)  Medications Ordered in UC Medications - No data to display  Initial Impression / Assessment and Plan / UC Course  I have reviewed the triage vital signs and the nursing notes.  Pertinent labs & imaging results that were available during my care of the patient were reviewed by me and considered in my medical decision making (see chart for details).     Patient is a 82 y.o. female who presents  after  right eye redness for the past 4 days.  On exam, she has a corneal abrasion seen on fluorescein stain.  Given erythromycin ointment first dose here. Advised to follow-up with an ophthalmologist or optometrist regarding her floaters. She denies difficulty seeing and pain. Recommended pt  pick up eye patch from the pharmacy, if desired. Understanding voiced.   Discussed MDM, treatment plan and plan for follow-up with patient who agrees with plan.  Final Clinical Impressions(s) / UC Diagnoses   Final diagnoses:  Abrasion of right cornea, initial encounter  Subconjunctival hemorrhage of right eye     Discharge Instructions      Follow up with your eye doctor to discuss the floaters and if your redness does not improve in the next week.      ED Prescriptions     Medication Sig Dispense Auth. Provider   erythromycin ophthalmic ointment Place into the right eye 3 (three) times daily. Place a 1/2 inch ribbon of ointment into the lower eyelid for 5 days 3.5 g Lyndee Hensen, DO      PDMP not reviewed this encounter.   Lyndee Hensen, DO 06/07/22 1425

## 2022-06-04 NOTE — ED Triage Notes (Signed)
Right eye reddness, and pain with floaters she has been seeing for a couple of months

## 2022-06-04 NOTE — ED Triage Notes (Signed)
Without glasses Right 20/70 Left 20/40  With Glasses Right 20/50 Left 20/70  With glasses both eyes 20/40

## 2022-06-04 NOTE — Discharge Instructions (Signed)
Follow up with your eye doctor to discuss the floaters and if your redness does not improve in the next week.

## 2022-07-15 ENCOUNTER — Encounter: Payer: Self-pay | Admitting: Family Medicine

## 2022-07-15 DIAGNOSIS — Z1231 Encounter for screening mammogram for malignant neoplasm of breast: Secondary | ICD-10-CM

## 2022-08-06 ENCOUNTER — Other Ambulatory Visit: Payer: Self-pay | Admitting: Internal Medicine

## 2022-08-06 DIAGNOSIS — N644 Mastodynia: Secondary | ICD-10-CM

## 2022-08-07 ENCOUNTER — Ambulatory Visit (INDEPENDENT_AMBULATORY_CARE_PROVIDER_SITE_OTHER): Payer: 59 | Admitting: Urology

## 2022-08-07 ENCOUNTER — Encounter: Payer: Self-pay | Admitting: Urology

## 2022-08-07 VITALS — BP 114/68 | HR 76 | Ht 60.0 in | Wt 196.0 lb

## 2022-08-07 DIAGNOSIS — N3281 Overactive bladder: Secondary | ICD-10-CM | POA: Diagnosis not present

## 2022-08-07 DIAGNOSIS — N390 Urinary tract infection, site not specified: Secondary | ICD-10-CM

## 2022-08-07 DIAGNOSIS — Z87440 Personal history of urinary (tract) infections: Secondary | ICD-10-CM

## 2022-08-07 MED ORDER — PREMARIN 0.625 MG/GM VA CREA: TOPICAL_CREAM | VAGINAL | 12 refills | Status: DC

## 2022-08-07 NOTE — Progress Notes (Signed)
   08/07/2022 2:37 PM   Edwina Grossberg 03-22-1941 [id number]  Reason for visit: Follow up recurrent UTIs, overactive bladder  HPI: 82 year old female with recurrent UTIs and overactive bladder who I last saw in August 2022.  At that point she was on oxybutynin for OAB, methenamine twice daily for UTIs, topical estrogen cream, and cranberry tablets, as well as a 90-day low-dose antibiotic.  I recommended 50-month follow-up at that time but she was lost to follow-up.  She also has a long history of intermittent pneumaturia, normal CT x 2 in the past with no evidence of fistula.  We had previously discussed cystoscopy and she deferred.  She denies any pneumaturia over the last few months.  She really has done well over the last year with only 1 UTI in December 2023.  She denies any urinary symptoms today, no longer taking the oxybutynin and denies any urge incontinence just some mild frequency and urgency that is minimally bothersome.  She also has been noncompliant with the topical estrogen cream, and I encouraged her to resume that.  -Resume topical estrogen cream and cranberry tablet prophylaxis -Reconsider cystoscopy in the future if recurrent infections or new gross hematuria -RTC 1 year symptom check   Sondra Come, MD  [facility] [address] [place], [place] [address] [phone]

## 2022-08-15 ENCOUNTER — Ambulatory Visit
Admission: RE | Admit: 2022-08-15 | Discharge: 2022-08-15 | Disposition: A | Discharge status: Home / Self Care | Payer: 59 | Source: Ambulatory Visit | Attending: Internal Medicine | Admitting: Internal Medicine

## 2022-08-15 DIAGNOSIS — N644 Mastodynia: Secondary | ICD-10-CM | POA: Diagnosis present

## 2022-10-16 ENCOUNTER — Ambulatory Visit
Admission: EM | Admit: 2022-10-16 | Discharge: 2022-10-16 | Disposition: A | Discharge status: Home / Self Care | Payer: 59 | Attending: Nurse Practitioner | Admitting: Nurse Practitioner

## 2022-10-16 DIAGNOSIS — L237 Allergic contact dermatitis due to plants, except food: Secondary | ICD-10-CM | POA: Diagnosis not present

## 2022-10-16 MED ORDER — PREDNISONE 20 MG PO TABS: 40.0000 mg | ORAL_TABLET | Freq: Every day | ORAL | 0 refills | Status: AC

## 2022-10-16 MED ORDER — TRIAMCINOLONE ACETONIDE 0.1 % EX CREA: 1.0000 | TOPICAL_CREAM | Freq: Two times a day (BID) | CUTANEOUS | 0 refills | Status: AC

## 2022-10-16 NOTE — ED Triage Notes (Signed)
Pt c/o rash all over body x3 days. States was around poison ivy. Has tried antiacid cream w/o relief.

## 2022-10-16 NOTE — Discharge Instructions (Signed)
Start oral prednisone daily for 5 days.  You may start topical triamcinolone cream to the rash as well for itching.  Do not use on your face.  You may also use calamine lotion to the area as needed.  Follow-up with your PCP if your symptoms do not improve.  Please go to the ER for any worsening symptoms

## 2022-10-16 NOTE — ED Provider Notes (Signed)
[facility]    CSN: [id number] Arrival date & time: 10/16/22  1007      History   Chief Complaint Chief Complaint  Patient presents with   Rash    HPI Alice [name] is a 82 y.o. female presents for rash.  Patient reports 2 days ago she was cleaning up her yard and came in contact with poison ivy.  She has since developed a blistery pruritic rash on bilateral hands and forearms, abdomen, and face and chest.  Does report some slight swelling of her face for the rash is located.  No fevers or chills.  No history of eczema or psoriasis.  She has been using an over-the-counter anti-itch cream with minimal relief.  No other concerns at this time.   Rash   Past Medical History:  Diagnosis Date   Anxiety    Arthritis    Depression    Fibromyalgia    GERD (gastroesophageal reflux disease)    History of carpal tunnel syndrome    History of diverticulitis    History of dysphagia    History of vertigo    Hypercholesteremia    Hypertension    Hypothyroidism    Migraine    Neuralgia 2014   Obesity    Polymyalgia rheumatica (HCC)     Patient Active Problem List   Diagnosis Date Noted   Hypertensive emergency 10/26/2020   Elevated troponin 10/26/2020   Chest pain 10/25/2020   Stage 3a chronic kidney disease (HCC) 10/29/2019   Lumbar radiculopathy 07/08/2019   Abnormal gait 06/17/2018   Displacement of lumbar intervertebral disc without myelopathy 06/17/2018   Impingement syndrome of shoulder region 06/17/2018   Knee stiff 06/17/2018   Mild cognitive impairment 07/03/2017   Generalized abdominal pain 05/08/2017   Hx of adenomatous colonic polyps 05/08/2017   Increased frequency of urination 05/08/2017   Urinary and fecal incontinence 05/08/2017   Chronic pain syndrome 07/09/2016   Musculoskeletal pain 07/09/2016   Severe obesity (BMI 35.0-39.9) with comorbidity (HCC) 12/04/2015   Chronic knee pain (S/P TKR) (Right) 12/04/2015   Chronic knee pain (Left)  12/04/2015   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) (Right) 12/04/2015   Osteoarthritis of knee (Left) 12/04/2015   Carpal tunnel syndrome 11/23/2015   Depression 11/23/2015   Fibromyalgia 11/23/2015   GERD (gastroesophageal reflux disease) 11/23/2015   History of dysphagia 11/23/2015   Hypothyroidism 11/23/2015   Hyperlipidemia 11/23/2015   Hypertension 11/23/2015   Migraine headache 11/23/2015   Osteoarthritis (arthritis due to wear and tear of joints) 11/23/2015   Chronic low back pain (Location of Tertiary source of pain) (Bilateral) (L>R) 11/23/2015   Chronic lower extremity pain (Bilateral) (L>R) 11/23/2015   Chronic knee pain (Location of Primary Source of Pain) (Bilateral) (L>R) 11/23/2015   Chronic hip pain (Location of Secondary source of pain) (Bilateral) (L>R) 11/23/2015   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) 11/23/2015   Lumbar facet syndrome (Bilateral) (L>R) 11/23/2015   Chronic sacroiliac joint pain (Bilateral) (L>R) 11/23/2015   Long term current use of opiate analgesic 11/23/2015   Long term prescription opiate use 11/23/2015   Opiate use 11/23/2015   Long term current use of non-steroidal anti-inflammatories (NSAID) 11/23/2015   Disturbance of skin sensation 11/23/2015   Diverticulosis 11/23/2015   Neurogenic pain 11/23/2015   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) 11/23/2015   Encounter for pain management planning 11/23/2015   Encounter for therapeutic drug level monitoring 11/23/2015   Chronic neck pain 11/23/2015   Lumbar  spondylosis 11/23/2015   Cervical spondylosis 11/23/2015   History of TKR (total knee replacement) (Right) 11/23/2015   Anxiety 08/25/2013   Neuralgia 01/25/2013   No transfusions per religious beliefs 02/27/2012    Past Surgical History:  Procedure Laterality Date   ABDOMINAL HYSTERECTOMY     BREAST CYST ASPIRATION Right +/- 10 yrs ago   x 2 neg   CHOLECYSTECTOMY     COLON SURGERY     COLONOSCOPY   09/08/2006   COLONOSCOPY WITH PROPOFOL N/A 07/16/2017   Procedure: COLONOSCOPY WITH PROPOFOL;  Surgeon: Scot Jun, MD;  Location: [facility] ENDOSCOPY;  Service: Endoscopy;  Laterality: N/A;   ESOPHAGOGASTRODUODENOSCOPY  01/24/2009   HERNIA REPAIR     JOINT REPLACEMENT Right    knee 2015   THYROIDECTOMY      OB History   No obstetric history on file.      Home Medications    Prior to Admission medications   Medication Sig Start Date End Date Taking? Authorizing Provider  aspirin EC 81 MG tablet Take 162 mg by mouth daily.   Yes [provider]  conjugated estrogens (PREMARIN) vaginal cream Estrogen Cream Instruction Discard applicator Apply pea sized amount to tip of finger to urethra before bed. Wash hands well after application. Use Monday, Wednesday and Friday 08/07/22  Yes Sondra Come, MD  Cranberry 500 MG CAPS Take by mouth.   Yes [provider]  [facility] D3 5000 units capsule TAKE 5,000 UNITS BY MOUTH ONCE DAILY. 10/10/15  Yes [provider]  erythromycin ophthalmic ointment Place into the right eye 3 (three) times daily. Place a 1/2 inch ribbon of ointment into the lower eyelid for 5 days 06/04/22  Yes Brimage, Vondra, DO  famotidine (PEPCID) 20 MG tablet Take 20 mg by mouth 2 (two) times daily. 07/17/22 07/17/23 Yes [provider]  gabapentin (NEURONTIN) 600 MG tablet Take 1 tablet by mouth 3 (three) times daily. 07/07/22  Yes [provider]  losartan (COZAAR) 25 MG tablet Take 25 mg by mouth daily. 04/29/19  Yes [provider]  metoprolol tartrate (LOPRESSOR) 25 MG tablet Take 1 tablet (25 mg total) by mouth 2 (two) times daily. 10/26/20  Yes Marrion Coy, MD  Misc Natural Products (GLUCOSAMINE CHONDROITIN ADV PO) Take by mouth.   Yes [provider]  PARoxetine (PAXIL) 40 MG tablet Take 40 mg by mouth daily. 09/11/15  Yes [provider]  predniSONE (DELTASONE) 20 MG tablet Take 2 tablets (40 mg total) by mouth  daily with breakfast for 5 days. 10/16/22 10/21/22 Yes Radford Pax, NP  simvastatin (ZOCOR) 20 MG tablet Take by mouth 2 (two) times daily.  05/09/15  Yes [provider]  triamcinolone cream (KENALOG) 0.1 % Apply 1 Application topically 2 (two) times daily. 10/16/22  Yes Radford Pax, NP  Vitamin E 180 MG (400 UNIT) CAPS Take by mouth.   Yes [provider]    Family History Family History  Problem Relation Age of Onset   Arthritis Mother    Arthritis Father    COPD Father    Arthritis Sister    Diabetes Sister    Cancer Brother    COPD Brother    Cancer Son    Drug abuse Son    Stroke Maternal Grandmother    Arthritis Paternal Grandfather    Breast cancer Neg Hx     Social History Social History   Tobacco Use   Smoking status: Former  Types: Cigarettes    Quit date: 02/26/1982    Years since quitting: 40.6   Smokeless tobacco: Never  Vaping Use   Vaping Use: Never used  Substance Use Topics   Alcohol use: No   Drug use: No     Allergies   Milk (cow), Milk-related compounds, Peanut oil, Peanut-containing drug products, and Meperidine   Review of Systems Review of Systems  Skin:  Positive for rash.     Physical Exam Triage Vital Signs ED Triage Vitals  Enc Vitals Group     BP 10/16/22 1013 (!) 157/76     Pulse Rate 10/16/22 1013 65     Resp 10/16/22 1013 16     Temp 10/16/22 1013 98.4 F (36.9 C)     Temp Source 10/16/22 1013 Oral     SpO2 10/16/22 1013 97 %     Weight 10/16/22 1012 195 lb (88.5 kg)     Height 10/16/22 1012 5\' 1"  (1.549 m)     Head Circumference --      Peak Flow --      Pain Score 10/16/22 1017 0     Pain Loc --      Pain Edu? --      Excl. in GC? --    No data found.  Updated Vital Signs BP (!) 157/76 (BP Location: Left Arm)   Pulse 65   Temp 98.4 F (36.9 C) (Oral)   Resp 16   Ht 5\' 1"  (1.549 m)   Wt 195 lb (88.5 kg)   SpO2 97%   BMI 36.84 kg/m   Visual Acuity Right Eye Distance:   Left Eye  Distance:   Bilateral Distance:    Right Eye Near:   Left Eye Near:    Bilateral Near:     Physical Exam Vitals and nursing note reviewed.  Constitutional:      General: She is not in acute distress.    Appearance: Normal appearance. She is not ill-appearing.  HENT:     Head: Normocephalic and atraumatic.  Eyes:     Pupils: Pupils are equal, round, and reactive to light.  Cardiovascular:     Rate and Rhythm: Normal rate.  Pulmonary:     Effort: Pulmonary effort is normal.  Skin:    General: Skin is warm and dry.     Comments: Scattered linear vesicular/macular rash on bilateral hands, forearms, chest, forehead.  No facial swelling or periorbital swelling.  Neurological:     General: No focal deficit present.     Mental Status: She is alert and oriented to person, place, and time.  Psychiatric:        Mood and Affect: Mood normal.        Behavior: Behavior normal.      UC Treatments / Results  Labs (all labs ordered are listed, but only abnormal results are displayed) Labs Reviewed - No data to display  EKG   Radiology No results found.  Procedures Procedures (including critical care time)  Medications Ordered in UC Medications - No data to display  Initial Impression / Assessment and Plan / UC Course  I have reviewed the triage vital signs and the nursing notes.  Pertinent labs & imaging results that were available during my care of the patient were reviewed by me and considered in my medical decision making (see chart for details).     Discussed symptoms consistent with poison ivy dermatitis.  Will start prednisone orally for 5 days and topical  triamcinolone twice daily.  Patient instructed not to use on her face.  Use over-the-counter calamine lotion and Benadryl.  PCP follow-up if symptoms do not improve.  ER precautions reviewed and patient verbalized understanding. Final Clinical Impressions(s) / UC Diagnoses   Final diagnoses:  Poison ivy dermatitis      Discharge Instructions      Start oral prednisone daily for 5 days.  You may start topical triamcinolone cream to the rash as well for itching.  Do not use on your face.  You may also use calamine lotion to the area as needed.  Follow-up with your PCP if your symptoms do not improve.  Please go to the ER for any worsening symptoms   ED Prescriptions     Medication Sig Dispense Auth. Provider   predniSONE (DELTASONE) 20 MG tablet Take 2 tablets (40 mg total) by mouth daily with breakfast for 5 days. 10 tablet Radford Pax, NP   triamcinolone cream (KENALOG) 0.1 % Apply 1 Application topically 2 (two) times daily. 30 g Radford Pax, NP      PDMP not reviewed this encounter.   Radford Pax, NP 10/16/22 [phone]

## 2022-12-11 ENCOUNTER — Other Ambulatory Visit: Payer: Self-pay | Admitting: Nurse Practitioner

## 2022-12-11 DIAGNOSIS — R17 Unspecified jaundice: Secondary | ICD-10-CM

## 2022-12-11 DIAGNOSIS — K760 Fatty (change of) liver, not elsewhere classified: Secondary | ICD-10-CM

## 2022-12-18 ENCOUNTER — Other Ambulatory Visit: Payer: Self-pay | Admitting: Nurse Practitioner

## 2022-12-18 DIAGNOSIS — K219 Gastro-esophageal reflux disease without esophagitis: Secondary | ICD-10-CM

## 2022-12-18 DIAGNOSIS — Z86010 Personal history of colonic polyps: Secondary | ICD-10-CM

## 2022-12-18 DIAGNOSIS — R1310 Dysphagia, unspecified: Secondary | ICD-10-CM

## 2022-12-23 ENCOUNTER — Ambulatory Visit
Admission: RE | Admit: 2022-12-23 | Discharge: 2022-12-23 | Disposition: A | Discharge status: Home / Self Care | Payer: 59 | Source: Ambulatory Visit | Attending: Nurse Practitioner | Admitting: Nurse Practitioner

## 2022-12-23 DIAGNOSIS — R17 Unspecified jaundice: Secondary | ICD-10-CM | POA: Diagnosis present

## 2022-12-23 DIAGNOSIS — K760 Fatty (change of) liver, not elsewhere classified: Secondary | ICD-10-CM

## 2022-12-24 ENCOUNTER — Ambulatory Visit
Admission: RE | Admit: 2022-12-24 | Discharge: 2022-12-24 | Disposition: A | Discharge status: Home / Self Care | Payer: 59 | Source: Ambulatory Visit | Attending: Nurse Practitioner | Admitting: Nurse Practitioner

## 2022-12-24 DIAGNOSIS — K219 Gastro-esophageal reflux disease without esophagitis: Secondary | ICD-10-CM | POA: Diagnosis present

## 2022-12-24 DIAGNOSIS — Z86010 Personal history of colonic polyps: Secondary | ICD-10-CM | POA: Diagnosis present

## 2022-12-24 DIAGNOSIS — R1310 Dysphagia, unspecified: Secondary | ICD-10-CM | POA: Diagnosis present

## 2023-01-13 ENCOUNTER — Other Ambulatory Visit: Payer: Self-pay

## 2023-01-13 ENCOUNTER — Emergency Department: Payer: 59

## 2023-01-13 ENCOUNTER — Observation Stay
Admission: EM | Admit: 2023-01-13 | Discharge: 2023-01-15 | Disposition: A | Discharge status: Home / Self Care | Payer: 59 | Attending: Internal Medicine | Admitting: Internal Medicine

## 2023-01-13 DIAGNOSIS — N1831 Chronic kidney disease, stage 3a: Secondary | ICD-10-CM | POA: Diagnosis not present

## 2023-01-13 DIAGNOSIS — Z1152 Encounter for screening for COVID-19: Secondary | ICD-10-CM | POA: Insufficient documentation

## 2023-01-13 DIAGNOSIS — E039 Hypothyroidism, unspecified: Secondary | ICD-10-CM | POA: Diagnosis present

## 2023-01-13 DIAGNOSIS — I160 Hypertensive urgency: Secondary | ICD-10-CM

## 2023-01-13 DIAGNOSIS — Z87891 Personal history of nicotine dependence: Secondary | ICD-10-CM | POA: Insufficient documentation

## 2023-01-13 DIAGNOSIS — R079 Chest pain, unspecified: Principal | ICD-10-CM | POA: Diagnosis present

## 2023-01-13 DIAGNOSIS — R072 Precordial pain: Secondary | ICD-10-CM | POA: Diagnosis not present

## 2023-01-13 DIAGNOSIS — Z7982 Long term (current) use of aspirin: Secondary | ICD-10-CM | POA: Insufficient documentation

## 2023-01-13 DIAGNOSIS — G3184 Mild cognitive impairment, so stated: Secondary | ICD-10-CM | POA: Diagnosis present

## 2023-01-13 DIAGNOSIS — M797 Fibromyalgia: Secondary | ICD-10-CM | POA: Diagnosis present

## 2023-01-13 DIAGNOSIS — Z79899 Other long term (current) drug therapy: Secondary | ICD-10-CM | POA: Diagnosis not present

## 2023-01-13 DIAGNOSIS — R7989 Other specified abnormal findings of blood chemistry: Secondary | ICD-10-CM | POA: Insufficient documentation

## 2023-01-13 DIAGNOSIS — F32A Depression, unspecified: Secondary | ICD-10-CM | POA: Diagnosis present

## 2023-01-13 DIAGNOSIS — I129 Hypertensive chronic kidney disease with stage 1 through stage 4 chronic kidney disease, or unspecified chronic kidney disease: Secondary | ICD-10-CM | POA: Diagnosis not present

## 2023-01-13 DIAGNOSIS — Z91010 Allergy to peanuts: Secondary | ICD-10-CM | POA: Insufficient documentation

## 2023-01-13 DIAGNOSIS — R0602 Shortness of breath: Secondary | ICD-10-CM | POA: Diagnosis not present

## 2023-01-13 DIAGNOSIS — Z96651 Presence of right artificial knee joint: Secondary | ICD-10-CM | POA: Diagnosis not present

## 2023-01-13 DIAGNOSIS — R0789 Other chest pain: Secondary | ICD-10-CM | POA: Diagnosis present

## 2023-01-13 NOTE — ED Triage Notes (Signed)
Patient arrives from home by [facility] with complaint of chest pain which began this afternoon.  She said she began coughing yesterday.  Patient says she also has pain in both arms, neck, and head.  Patient was given 324mg  aspirin, and 1 spray of nitroglycerin, and one inch of nitroglycerin paste.
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
## 2023-01-14 ENCOUNTER — Observation Stay: Payer: 59

## 2023-01-14 DIAGNOSIS — G8929 Other chronic pain: Secondary | ICD-10-CM | POA: Insufficient documentation

## 2023-01-14 DIAGNOSIS — R072 Precordial pain: Secondary | ICD-10-CM | POA: Diagnosis not present

## 2023-01-14 DIAGNOSIS — I160 Hypertensive urgency: Secondary | ICD-10-CM

## 2023-01-14 LAB — BASIC METABOLIC PANEL
Anion gap: 10 (ref 5–15)
BUN: 15 mg/dL (ref 8–23)
CO2: 24 mmol/L (ref 22–32)
Calcium: 9.2 mg/dL (ref 8.9–10.3)
Chloride: 103 mmol/L (ref 98–111)
Creatinine, Ser: 0.68 mg/dL (ref 0.44–1.00)
GFR, Estimated: >60 mL/min (ref >60)
Glucose, Bld: 177 mg/dL — ABNORMAL HIGH (ref 70–99)
Potassium: 3.5 mmol/L (ref 3.5–5.1)
Sodium: 137 mmol/L (ref 135–145)

## 2023-01-14 LAB — CBC
HCT: 42.3 % (ref 36.0–46.0)
Hemoglobin: 15 g/dL (ref 12.0–15.0)
MCH: 32.9 pg (ref 26.0–34.0)
MCHC: 35.5 g/dL (ref 30.0–36.0)
MCV: 92.8 fL (ref 80.0–100.0)
Platelets: 174 10*3/uL (ref 150–400)
RBC: 4.56 MIL/uL (ref 3.87–5.11)
RDW: 12.3 % (ref 11.5–15.5)
WBC: 11.5 10*3/uL — ABNORMAL HIGH (ref 4.0–10.5)
nRBC: 0 % (ref 0.0–0.2)

## 2023-01-14 LAB — RESP PANEL BY RT-PCR (RSV, FLU A&B, COVID)  RVPGX2
Influenza A by PCR: NEGATIVE
Influenza B by PCR: NEGATIVE
Resp Syncytial Virus by PCR: NEGATIVE
SARS Coronavirus 2 by RT PCR: NEGATIVE

## 2023-01-14 LAB — TROPONIN I (HIGH SENSITIVITY)
Troponin I (High Sensitivity): 52 ng/L — ABNORMAL HIGH (ref <18)
Troponin I (High Sensitivity): 59 ng/L — ABNORMAL HIGH (ref <18)

## 2023-01-14 LAB — TSH: TSH: 0.996 u[IU]/mL (ref 0.350–4.500)

## 2023-01-14 MED ORDER — ACETAMINOPHEN 500 MG PO TABS
1000.0000 mg | ORAL_TABLET | Freq: Once | ORAL | Status: AC
  Administered 2023-01-14: 1000 mg via ORAL
  Filled 2023-01-14: qty 2

## 2023-01-14 MED ORDER — ACETAMINOPHEN 325 MG PO TABS
650.0000 mg | ORAL_TABLET | ORAL | Status: DC | PRN
  Administered 2023-01-14 – 2023-01-15 (×3): 650 mg via ORAL
  Filled 2023-01-14 (×3): qty 2

## 2023-01-14 MED ORDER — REGADENOSON 0.4 MG/5ML IV SOLN
0.4000 mg | Freq: Once | INTRAVENOUS | Status: AC
  Administered 2023-01-14: 0.4 mg via INTRAVENOUS

## 2023-01-14 MED ORDER — FAMOTIDINE 20 MG PO TABS
20.0000 mg | ORAL_TABLET | Freq: Two times a day (BID) | ORAL | Status: DC
  Administered 2023-01-14 – 2023-01-15 (×3): 20 mg via ORAL
  Filled 2023-01-14 (×3): qty 1

## 2023-01-14 MED ORDER — TECHNETIUM TC 99M TETROFOSMIN IV KIT
9.7800 | PACK | Freq: Once | INTRAVENOUS | Status: AC | PRN
  Administered 2023-01-14: 9.78 via INTRAVENOUS

## 2023-01-14 MED ORDER — SIMVASTATIN 20 MG PO TABS
20.0000 mg | ORAL_TABLET | Freq: Every day | ORAL | Status: DC
  Administered 2023-01-14: 20 mg via ORAL
  Filled 2023-01-14: qty 2

## 2023-01-14 MED ORDER — NITROGLYCERIN 0.4 MG SL SUBL: 0.4000 mg | SUBLINGUAL_TABLET | SUBLINGUAL | Status: DC | PRN

## 2023-01-14 MED ORDER — METOPROLOL TARTRATE 25 MG PO TABS
25.0000 mg | ORAL_TABLET | Freq: Two times a day (BID) | ORAL | Status: DC
  Administered 2023-01-14 – 2023-01-15 (×2): 25 mg via ORAL
  Filled 2023-01-14 (×2): qty 1

## 2023-01-14 MED ORDER — GABAPENTIN 300 MG PO CAPS
600.0000 mg | ORAL_CAPSULE | Freq: Three times a day (TID) | ORAL | Status: DC
  Administered 2023-01-14 – 2023-01-15 (×4): 600 mg via ORAL
  Filled 2023-01-14 (×4): qty 2

## 2023-01-14 MED ORDER — ASPIRIN 81 MG PO TBEC
162.0000 mg | DELAYED_RELEASE_TABLET | Freq: Every day | ORAL | Status: DC
  Administered 2023-01-14: 162 mg via ORAL
  Filled 2023-01-14: qty 2

## 2023-01-14 MED ORDER — ONDANSETRON HCL 4 MG/2ML IJ SOLN: 4.0000 mg | Freq: Four times a day (QID) | INTRAMUSCULAR | Status: DC | PRN

## 2023-01-14 MED ORDER — PAROXETINE HCL 20 MG PO TABS
40.0000 mg | ORAL_TABLET | Freq: Every day | ORAL | Status: DC
  Administered 2023-01-14 – 2023-01-15 (×2): 40 mg via ORAL
  Filled 2023-01-14 (×2): qty 2

## 2023-01-14 MED ORDER — ASPIRIN 81 MG PO TBEC
81.0000 mg | DELAYED_RELEASE_TABLET | Freq: Every day | ORAL | Status: DC
  Administered 2023-01-15: 81 mg via ORAL
  Filled 2023-01-14: qty 1

## 2023-01-14 MED ORDER — METOPROLOL TARTRATE 25 MG PO TABS
12.5000 mg | ORAL_TABLET | Freq: Two times a day (BID) | ORAL | Status: DC
  Administered 2023-01-14: 12.5 mg via ORAL
  Filled 2023-01-14: qty 1

## 2023-01-14 MED ORDER — LOSARTAN POTASSIUM 25 MG PO TABS
25.0000 mg | ORAL_TABLET | Freq: Every day | ORAL | Status: DC
  Administered 2023-01-14 – 2023-01-15 (×2): 25 mg via ORAL
  Filled 2023-01-14 (×2): qty 1

## 2023-01-14 MED ORDER — TECHNETIUM TC 99M TETROFOSMIN IV KIT
31.9200 | PACK | Freq: Once | INTRAVENOUS | Status: AC | PRN
  Administered 2023-01-14: 31.92 via INTRAVENOUS

## 2023-01-14 MED ORDER — ENOXAPARIN SODIUM 60 MG/0.6ML IJ SOSY
45.0000 mg | PREFILLED_SYRINGE | INTRAMUSCULAR | Status: DC
  Administered 2023-01-14 – 2023-01-15 (×2): 45 mg via SUBCUTANEOUS
  Filled 2023-01-14 (×2): qty 0.6

## 2023-01-14 MED ORDER — ISOSORBIDE MONONITRATE ER 30 MG PO TB24
30.0000 mg | ORAL_TABLET | Freq: Every day | ORAL | Status: DC
  Administered 2023-01-14 – 2023-01-15 (×2): 30 mg via ORAL
  Filled 2023-01-14 (×2): qty 1

## 2023-01-14 NOTE — Assessment & Plan Note (Signed)
Chest pain resolved with nitroglycerin given by EMS.  Troponins elevated but flat and EKG nonacute History of nonischemic stress test normal echo 2022 Continue home aspirin, metoprolol, simvastatin and losartan Nitroglycerin as needed chest pain Cardiology consult for further risk stratification

## 2023-01-14 NOTE — Assessment & Plan Note (Deleted)
Chest pain resolved with nitroglycerin given by EMS.  Troponins elevated but flat and EKG nonacute History of nonischemic stress test normal echo 2022 Continue home aspirin, metoprolol, simvastatin and losartan Nitroglycerin as needed chest pain Cardiology consult for further risk stratification

## 2023-01-14 NOTE — Assessment & Plan Note (Addendum)
Depression Continue paroxetine and gabapentin

## 2023-01-14 NOTE — Assessment & Plan Note (Signed)
Renal function at baseline

## 2023-01-14 NOTE — Assessment & Plan Note (Signed)
SBP in 190s on arrival Continue metoprolol and losartan

## 2023-01-14 NOTE — Progress Notes (Signed)
Progress Note   Patient: Alice [name] GEX:[id number] DOB: 12-20-1940 DOA: 01/13/2023     0 DOS: the patient was seen and examined on 01/14/2023    Subjective:  I have seen and examined patient in the presence of the husband She is no longer having any chest pains Cardiologist have also seen patient and planning cardiac stress test as well as echocardiogram She denies nausea vomiting chest pain abdominal pain or urinary complaints    Brief hospital course: From HPI "Alice [name] is a 82 y.o. female with medical history significant for HTN, hypothyroidism and fibromyalgia who presents to the ED for evaluation of chest tightness that started at rest just prior to presentation.  Pain radiated down both her arms.  She had no associated nausea, vomiting, diaphoresis, shortness of breath or lightheadedness.  Denies any leg pain or swelling.  EMS administered sublingual nitroglycerin on their arrival with complete resolution in pain.  Of note, patient had a negative diagnostic cardiac workup (stress test and echocardiogram) in May 2022 that was indicated for elevated troponin in the setting of hypertensive emergency ED course and data review: BP 197/101 on arrival, improving on its own to 143/70.  Vitals otherwise within normal limits., EKG personally viewed and interpreted showing sinus rhythm at 70 with no acute ST-T wave changes.  "  Assessment and Plan:  *Atypical chest pain Chest pain resolved with nitroglycerin given by EMS.  Troponins elevated but flat and EKG nonacute History of nonischemic stress test normal echo 2022 Continue home aspirin, metoprolol, simvastatin and losartan Nitroglycerin as needed chest pain I have discussed the case with cardiology Follow-up on cardiac stress test results Continue to monitor on telemetry for at least 24 hours Follow-up on echocardiogram report   Hypertensive urgency SBP in 190s on arrival Continue metoprolol and losartan Monitor blood  pressure closely   Stage 3a chronic kidney disease (HCC) Renal function at baseline   Mild cognitive impairment Delirium precautions   Fibromyalgia Depression Continue paroxetine and gabapentin       DVT prophylaxis: Lovenox   Consults: [facility]   Advance Care Planning:   Code Status: Prior     Disposition Plan: Back to previous home environment    Physical Exam:  Vitals and nursing note reviewed.  Constitutional:      General: She is not in acute distress. HENT:     Head: Normocephalic and atraumatic.  Cardiovascular:     Rate and Rhythm: Normal rate and regular rhythm.     Heart sounds: Normal heart sounds.  Pulmonary:     Effort: Pulmonary effort is normal.     Breath sounds: Normal breath sounds.  Abdominal:     Palpations: Abdomen is soft.     Tenderness: There is no abdominal tenderness.  Neurological:     Mental Status: Mental status is at baseline.     Data Reviewed: I have reviewed patient's admission provider's documentation, previous records briefly, I have reviewed the labs including troponins and reviewed patient's EKG, I have reviewed patient's chest x-ray that did not show any acute intrapulmonary pathology, I have reviewed cardiology documentation as well as nursing notes.  Family Communication: Discussed with patient's husband present at bedside      Latest Ref Rng & Units 01/13/2023   11:54 PM 10/26/2020    4:31 AM 10/25/2020    7:46 PM  CBC  WBC 4.0 - 10.5 K/uL 11.5  13.2  9.1   Hemoglobin 12.0 - 15.0 g/dL 01.0  27.2  15.7   Hematocrit 36.0 - 46.0 % 42.3  41.3  44.0   Platelets 150 - 400 K/uL 174  197  189        Latest Ref Rng & Units 01/13/2023   11:54 PM 10/26/2020    4:31 AM 10/25/2020    7:46 PM  BMP  Glucose 70 - 99 mg/dL 604  540  981   BUN 8 - 23 mg/dL 15  8  12    Creatinine 0.44 - 1.00 mg/dL 1.91  4.78  2.95   Sodium 135 - 145 mmol/L 137  136  139   Potassium 3.5 - 5.1 mmol/L 3.5  4.0  3.3   Chloride 98 - 111 mmol/L  103  107  105   CO2 22 - 32 mmol/L 24  23  24    Calcium 8.9 - 10.3 mg/dL 9.2  8.6  9.6      Vitals:   01/14/23 1100 01/14/23 1200 01/14/23 1300 01/14/23 1504  BP: (!) 166/73 (!) 162/74 (!) 142/70   Pulse: 65 63 62   Resp: 15 19 12    Temp:    98.1 F (36.7 C)  TempSrc:    Oral  SpO2: 96% 97% 94%   Weight:      Height:         Time spent: 56 minutes  Author: Loyce Dys, MD 01/14/2023 5:07 PM  For on call review [email or web address].

## 2023-01-14 NOTE — Consult Note (Signed)
[facility] CLINIC CARDIOLOGY CONSULT NOTE       Patient ID: Alice [name] MRN: [id number] DOB/AGE: October 02, 1940 82 y.o.  Admit date: 01/13/2023 Referring Physician Dr. Lindajo Royal Primary Physician [facility] Primary Cardiologist Dr. Juliann Pares (last seen 2022) Reason for Consultation chest pain  HPI: Alice [name] is a 82 y.o. female  with a past medical history of  HTN, hyperlipidemia, hypothyroidism and fibromyalgia who presented to the ED on 01/13/2023 for chest pain. Cardiology was consulted for further evaluation.   Patient reports that she was in her usual state of health until late last night.  States that she had fallen asleep in her recliner and woke up with central chest pain, bilateral arm pain, back pain, and a headache.  She states that the pain all started at the same time.  It did not begin in 1 location and radiate to others.  She describes the pain as "sharp and burning".  Reports associated palpitations.  She denies any similar episodes recently.  At this time she called EMS for evaluation.  She was given sublingual nitroglycerin and Nitropaste by EMS and brought to the ED.  She states that shortly after arriving in the ED she noticed that her symptoms improved.  Found to be hypertensive in the ED.  Lab work notable for creatinine 0.68, potassium 3.5, hemoglobin 15.0, troponins trended 52 > 59.  EKG was nonacute.  At the time of my evaluation this morning patient is resting comfortably in ED stretcher with husband present at bedside.  She states that overall her chest pain is mostly resolved.  She also denies any shortness of breath or palpitations.  Only complaint is that she has a headache.  BP has remained elevated.  She states that at home this is usually well-controlled.  States that she has been compliant with her medications.  She endorses a similar episode like this in the past in 2022 but has not had any other since this time.  She underwent stress testing  at that time which was low risk.  Review of systems complete and found to be negative unless listed above    Past Medical History:  Diagnosis Date   Anxiety    Arthritis    Depression    Fibromyalgia    GERD (gastroesophageal reflux disease)    History of carpal tunnel syndrome    History of diverticulitis    History of dysphagia    History of vertigo    Hypercholesteremia    Hypertension    Hypothyroidism    Migraine    Neuralgia 2014   Obesity    Polymyalgia rheumatica (HCC)     Past Surgical History:  Procedure Laterality Date   ABDOMINAL HYSTERECTOMY     BREAST CYST ASPIRATION Right +/- 10 yrs ago   x 2 neg   CHOLECYSTECTOMY     COLON SURGERY     COLONOSCOPY  09/08/2006   COLONOSCOPY WITH PROPOFOL N/A 07/16/2017   Procedure: COLONOSCOPY WITH PROPOFOL;  Surgeon: Scot Jun, MD;  Location: [facility] ENDOSCOPY;  Service: Endoscopy;  Laterality: N/A;   ESOPHAGOGASTRODUODENOSCOPY  01/24/2009   HERNIA REPAIR     JOINT REPLACEMENT Right    knee 2015   THYROIDECTOMY      (Not in a hospital admission)  Social History   Socioeconomic History   Marital status: Married    Spouse name: Not on file   Number of children: Not on file   Years of education: Not on file  Highest education level: Not on file  Occupational History   Not on file  Tobacco Use   Smoking status: Former    Current packs/day: 0.00    Types: Cigarettes    Quit date: 02/26/1982    Years since quitting: 40.9   Smokeless tobacco: Never  Vaping Use   Vaping status: Never Used  Substance and Sexual Activity   Alcohol use: No   Drug use: No   Sexual activity: Not Currently  Other Topics Concern   Not on file  Social History Narrative   Not on file   Social Determinants of Health   Financial Resource Strain: Not on file  Food Insecurity: Not on file  Transportation Needs: Not on file  Physical Activity: Not on file  Stress: Not on file  Social Connections: Not on file  Intimate Partner  Violence: Not on file    Family History  Problem Relation Age of Onset   Arthritis Mother    Arthritis Father    COPD Father    Arthritis Sister    Diabetes Sister    Cancer Brother    COPD Brother    Cancer Son    Drug abuse Son    Stroke Maternal Grandmother    Arthritis Paternal Grandfather    Breast cancer Neg Hx      Vitals:   01/14/23 1100 01/14/23 1200 01/14/23 1300 01/14/23 1504  BP: (!) 166/73 (!) 162/74 (!) 142/70   Pulse: 65 63 62   Resp: 15 19 12    Temp:    98.1 F (36.7 C)  TempSrc:    Oral  SpO2: 96% 97% 94%   Weight:      Height:        PHYSICAL EXAM General: Well appearing, well nourished, in no acute distress laying flat in hospital bed with husband present at bedside. HEENT: Normocephalic and atraumatic. Neck: No JVD.  Lungs: Normal respiratory effort on room air. Clear bilaterally to auscultation. No wheezes, crackles, rhonchi.  Heart: HRRR. Normal S1 and S2 without gallops or murmurs.  Abdomen: Non-distended appearing.  Msk: Normal strength and tone for age. Extremities: Warm and well perfused. No clubbing, cyanosis. No edema.  Neuro: Alert and oriented X 3. Psych: Answers questions appropriately.   Labs: Basic Metabolic Panel: Recent Labs    01/13/23 2354  NA 137  K 3.5  CL 103  CO2 24  GLUCOSE 177*  BUN 15  CREATININE 0.68  CALCIUM 9.2   Liver Function Tests: No results for input(s): "AST", "ALT", "ALKPHOS", "BILITOT", "PROT", "ALBUMIN" in the last 72 hours. No results for input(s): "LIPASE", "AMYLASE" in the last 72 hours. CBC: Recent Labs    01/13/23 2354  WBC 11.5*  HGB 15.0  HCT 42.3  MCV 92.8  PLT 174   Cardiac Enzymes: Recent Labs    01/13/23 2354 01/14/23 0111  TROPONINIHS 52* 59*   BNP: No results for input(s): "BNP" in the last 72 hours. D-Dimer: No results for input(s): "DDIMER" in the last 72 hours. Hemoglobin A1C: No results for input(s): "HGBA1C" in the last 72 hours. Fasting Lipid Panel: No results  for input(s): "CHOL", "HDL", "LDLCALC", "TRIG", "CHOLHDL", "LDLDIRECT" in the last 72 hours. Thyroid Function Tests: Recent Labs    01/14/23 0753  TSH 0.996   Anemia Panel: No results for input(s): "VITAMINB12", "FOLATE", "FERRITIN", "TIBC", "IRON", "RETICCTPCT" in the last 72 hours.   Radiology: DG Chest Portable 1 View  Result Date: 01/14/2023 CLINICAL DATA:  Chest pain EXAM: PORTABLE  CHEST 1 VIEW COMPARISON:  10/18/2021 FINDINGS: Heart and mediastinal contours are within normal limits. No focal opacities or effusions. No acute bony abnormality. Aortic atherosclerosis. IMPRESSION: No active disease. Electronically Signed   By: Charlett Nose M.D.   On: 01/14/2023 00:28   US Abdomen Complete  Result Date: 01/02/2023 CLINICAL DATA:  Hepatic steatosis.  Elevated total bilirubin. EXAM: ABDOMEN ULTRASOUND COMPLETE COMPARISON:  CT, 10/25/2020. FINDINGS: Gallbladder: Status post cholecystectomy. Common bile duct: Diameter: 10 mm, stable from the prior CT. Liver: Normal liver size. Increased liver parenchymal echogenicity. No liver mass. Portal vein is patent on color Doppler imaging with normal direction of blood flow towards the liver. IVC: No abnormality visualized. Pancreas: Visualized portion unremarkable. Spleen: Size and appearance within normal limits. Right Kidney: Length: 9.8 cm. Normal parenchymal echogenicity. Three cysts. Upper pole cyst measuring 3.9 x 2.8 x 3.1 cm. Lower pole cyst measuring 1.9 x 1.5 x 1.5 cm. Midpole cyst measuring 5.4 x 4.4 x 4.5 cm. No solid masses, no stones and no hydronephrosis. Left Kidney: Length: 10.5 cm. Normal parenchymal echogenicity. Upper pole cyst, 5.6 x 4.4 x 4.4 cm. Lower pole cyst, 2.5 x 2.1 x 2.3 cm. No solid masses, stones or hydronephrosis. Abdominal aorta: No aneurysm visualized. Other findings: None. IMPRESSION: 1. No acute findings. 2. Increased liver parenchymal echogenicity consistent with hepatic steatosis. No liver mass. 3. Status post  cholecystectomy. Simple renal cysts, no follow-up recommended. No other abnormalities. Electronically Signed   By: Amie Portland M.D.   On: 01/02/2023 10:25   DG ESOPHAGUS W DOUBLE CM (HD)  Result Date: 12/24/2022 CLINICAL DATA:  Patient with a history of dysphagia and the feeling of choking while eating. EXAM: ESOPHAGUS/BARIUM SWALLOW/TABLET STUDY TECHNIQUE: Combined double and single contrast examination was performed using effervescent crystals, high-density barium, and thin liquid barium. This exam was performed by Alwyn Ren NP, and was supervised and interpreted by Dr. Agustin Cree. FLUOROSCOPY: Radiation Exposure Index (as provided by the fluoroscopic device): 56.8 mGy Kerma COMPARISON:  None Available. FINDINGS: Swallowing: Appears normal. No vestibular penetration or aspiration seen. Pharynx: Unremarkable. Esophagus: Normal appearance. Esophageal motility: Moderate to severe dysmotility with tertiary contractions and transient retropulsion of contrast bolus due to disorganized contractions. Hiatal Hernia: None visualized. Gastroesophageal reflux: None visualized. Ingested 13mm barium tablet: Passed normally Other: Degenerative changes of the cervical spine most pronounced at C5-6, where there is intervertebral disc space narrowing and anterior disc osteophyte formation. 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 No obstruction to the passage of a 13 mm tablet. Electronically Signed   By: Agustin Cree M.D.   On: 12/24/2022 12:58    ECHO ordered  TELEMETRY reviewed by me 01/14/2023: sinus rhythm PACs rate 60s  EKG reviewed by me: sinus rhythm rate 70 bpm, nonischemic  Data reviewed by me 01/14/2023: last 24h vitals tele labs imaging I/O ED provider note, admission H&P  Principal Problem:   Precordial chest pain Active Problems:   Depression   Fibromyalgia   Hypothyroidism   Mild cognitive impairment   Stage 3a chronic kidney disease (HCC)   Chest pain   Hypertensive urgency     ASSESSMENT AND PLAN:  Alice [name] is a 82 y.o. female  with a past medical history of  HTN, hyperlipidemia, hypothyroidism and fibromyalgia who presented to the ED on 01/13/2023 for chest pain. Cardiology was consulted for further evaluation.   # Chest pain # Hypertensive urgency # Hyperlipidemia Patient presenting with episode of chest pain last night which resolved with nitroglycerin.  Found to be hypertensive in the 190 systolic in the ED.  Troponins 52 > 59.  EKG nonacute. -Nuclear stress test for further evaluation of symptoms. -Echocardiogram for further evaluation of heart function. -Continue aspirin 81 mg daily, simvastatin 20 mg daily -Start Imdur 30 mg daily.  Continue losartan 25 mg daily and metoprolol tartrate 25 mg twice daily.  # Chronic kidney disease stage II Creatinine at baseline on presentation at 0.68.  -Continue to monitor renal function closely   This patient's plan of care was discussed and created with Dr. Corky Sing and he is in agreement.  Signed: Gale Journey, PA-C  01/14/2023, 3:35 PM [facility] Card[facility]

## 2023-01-14 NOTE — Assessment & Plan Note (Signed)
Delirium precautions 

## 2023-01-14 NOTE — H&P (Signed)
History and Physical    Patient: Alice [name] XLK:[id number] DOB: [birth year] DOA: 01/13/2023 DOS: the patient was seen and examined on 01/14/2023 PCP: [facility]  Patient coming from: Home  Chief Complaint:  Chief Complaint  Patient presents with   Chest Pain    HPI: Alice [name] is a 82 y.o. female with medical history significant for HTN, hypothyroidism and fibromyalgia who presents to the ED for evaluation of chest tightness that started at rest just prior to presentation.  Pain radiated down both her arms.  She had no associated nausea, vomiting, diaphoresis, shortness of breath or lightheadedness.  Denies any leg pain or swelling.  EMS administered sublingual nitroglycerin on their arrival with complete resolution in pain.  Of note, patient had a negative diagnostic cardiac workup (stress test and echocardiogram) in May 2022 that was indicated for elevated troponin in the setting of hypertensive emergency ED course and data review: BP 197/101 on arrival, improving on its own to 143/70.  Vitals otherwise within normal limits., EKG personally viewed and interpreted showing sinus rhythm at 70 with no acute ST-T wave changes. Labs notable for: Troponin 52-59, WBC 11,500. Respiratory viral panel negative for COVID flu and RSV Chest x-ray with no active disease Hospitalist consulted for admission for high risk chest pain   Review of Systems: As mentioned in the history of present illness. All other systems reviewed and are negative.  Past Medical History:  Diagnosis Date   Anxiety    Arthritis    Depression    Fibromyalgia    GERD (gastroesophageal reflux disease)    History of carpal tunnel syndrome    History of diverticulitis    History of dysphagia    History of vertigo    Hypercholesteremia    Hypertension    Hypothyroidism    Migraine    Neuralgia 2014   Obesity    Polymyalgia rheumatica (HCC)    Past Surgical History:  Procedure Laterality  Date   ABDOMINAL HYSTERECTOMY     BREAST CYST ASPIRATION Right +/- 10 yrs ago   x 2 neg   CHOLECYSTECTOMY     COLON SURGERY     COLONOSCOPY  09/08/2006   COLONOSCOPY WITH PROPOFOL N/A 07/16/2017   Procedure: COLONOSCOPY WITH PROPOFOL;  Surgeon: Scot Jun, MD;  Location: [facility] ENDOSCOPY;  Service: Endoscopy;  Laterality: N/A;   ESOPHAGOGASTRODUODENOSCOPY  01/24/2009   HERNIA REPAIR     JOINT REPLACEMENT Right    knee 2015   THYROIDECTOMY     Social History:  reports that she quit smoking about 40 years ago. Her smoking use included cigarettes. She has never used smokeless tobacco. She reports that she does not drink alcohol and does not use drugs.  Allergies  Allergen Reactions   Meperidine Hcl Other (See Comments)   Milk (Cow) Other (See Comments)    Lactose Intolerance, headache LactoseIntolerance Lactose Intolerance, headache    Milk-Related Compounds    Peanut Oil Other (See Comments)    Headache Headache    Peanut-Containing Drug Products    Meperidine Other (See Comments) and Palpitations    RAYS COMES OUT OF EYES    Family History  Problem Relation Age of Onset   Arthritis Mother    Arthritis Father    COPD Father    Arthritis Sister    Diabetes Sister    Cancer Brother    COPD Brother    Cancer Son    Drug abuse Son    Stroke  Maternal Grandmother    Arthritis Paternal Grandfather    Breast cancer Neg Hx     Prior to Admission medications   Medication Sig Start Date End Date Taking? Authorizing Provider  aspirin EC 81 MG tablet Take 162 mg by mouth daily.    [provider]  conjugated estrogens (PREMARIN) vaginal cream Estrogen Cream Instruction Discard applicator Apply pea sized amount to tip of finger to urethra before bed. Wash hands well after application. Use Monday, Wednesday and Friday 08/07/22   Sondra Come, MD  Cranberry 500 MG CAPS Take by mouth.    [provider]  [facility] D3 5000 units capsule TAKE 5,000 UNITS BY MOUTH  ONCE DAILY. 10/10/15   [provider]  erythromycin ophthalmic ointment Place into the right eye 3 (three) times daily. Place a 1/2 inch ribbon of ointment into the lower eyelid for 5 days 06/04/22   Katha Cabal, DO  famotidine (PEPCID) 20 MG tablet Take 20 mg by mouth 2 (two) times daily. 07/17/22 07/17/23  [provider]  gabapentin (NEURONTIN) 600 MG tablet Take 1 tablet by mouth 3 (three) times daily. 07/07/22   [provider]  losartan (COZAAR) 25 MG tablet Take 25 mg by mouth daily. 04/29/19   [provider]  metoprolol tartrate (LOPRESSOR) 25 MG tablet Take 1 tablet (25 mg total) by mouth 2 (two) times daily. 10/26/20   Marrion Coy, MD  Misc Natural Products (GLUCOSAMINE CHONDROITIN ADV PO) Take by mouth.    [provider]  PARoxetine (PAXIL) 40 MG tablet Take 40 mg by mouth daily. 09/11/15   [provider]  simvastatin (ZOCOR) 20 MG tablet Take by mouth 2 (two) times daily.  05/09/15   [provider]  triamcinolone cream (KENALOG) 0.1 % Apply 1 Application topically 2 (two) times daily. 10/16/22   Radford Pax, NP  Vitamin E 180 MG (400 UNIT) CAPS Take by mouth.    [provider]    Physical Exam: Vitals:   01/13/23 2330 01/14/23 0106 01/14/23 0130 01/14/23 0200  BP: (!) 156/80 (!) 187/108 (!) 199/90 (!) 143/70  Pulse: 67 73 80 74  Resp: 12 13 (!) 22 (!) 21  Temp:      TempSrc:      SpO2:  96%  96%  Weight:      Height:       Physical Exam Vitals and nursing note reviewed.  Constitutional:      General: She is not in acute distress. HENT:     Head: Normocephalic and atraumatic.  Cardiovascular:     Rate and Rhythm: Normal rate and regular rhythm.     Heart sounds: Normal heart sounds.  Pulmonary:     Effort: Pulmonary effort is normal.     Breath sounds: Normal breath sounds.  Abdominal:     Palpations: Abdomen is soft.     Tenderness: There is no abdominal tenderness.  Neurological:     Mental  Status: Mental status is at baseline.     Labs on Admission: I have personally reviewed following labs and imaging studies  CBC: Recent Labs  Lab 01/13/23 2354  WBC 11.5*  HGB 15.0  HCT 42.3  MCV 92.8  PLT 174   Basic Metabolic Panel: Recent Labs  Lab 01/13/23 2354  NA 137  K 3.5  CL 103  CO2 24  GLUCOSE 177*  BUN 15  CREATININE 0.68  CALCIUM 9.2   GFR: Estimated Creatinine Clearance: 56.1 mL/min (by C-G  formula based on SCr of 0.68 mg/dL). Liver Function Tests: No results for input(s): "AST", "ALT", "ALKPHOS", "BILITOT", "PROT", "ALBUMIN" in the last 168 hours. No results for input(s): "LIPASE", "AMYLASE" in the last 168 hours. No results for input(s): "AMMONIA" in the last 168 hours. Coagulation Profile: No results for input(s): "INR", "PROTIME" in the last 168 hours. Cardiac Enzymes: No results for input(s): "CKTOTAL", "CKMB", "CKMBINDEX", "TROPONINI" in the last 168 hours. BNP (last 3 results) No results for input(s): "PROBNP" in the last 8760 hours. HbA1C: No results for input(s): "HGBA1C" in the last 72 hours. CBG: No results for input(s): "GLUCAP" in the last 168 hours. Lipid Profile: No results for input(s): "CHOL", "HDL", "LDLCALC", "TRIG", "CHOLHDL", "LDLDIRECT" in the last 72 hours. Thyroid Function Tests: No results for input(s): "TSH", "T4TOTAL", "FREET4", "T3FREE", "THYROIDAB" in the last 72 hours. Anemia Panel: No results for input(s): "VITAMINB12", "FOLATE", "FERRITIN", "TIBC", "IRON", "RETICCTPCT" in the last 72 hours. Urine analysis:    Component Value Date/Time   COLORURINE YELLOW (A) 10/18/2021 1834   APPEARANCEUR HAZY (A) 10/18/2021 1834   APPEARANCEUR Cloudy (A) 03/08/2020 1437   LABSPEC 1.012 10/18/2021 1834   LABSPEC 1.002 01/08/2014 1326   PHURINE 6.0 10/18/2021 1834   GLUCOSEU NEGATIVE 10/18/2021 1834   GLUCOSEU Negative 01/08/2014 1326   HGBUR NEGATIVE 10/18/2021 1834   BILIRUBINUR NEGATIVE 10/18/2021 1834   BILIRUBINUR  Negative 03/08/2020 1437   BILIRUBINUR Negative 01/08/2014 1326   KETONESUR NEGATIVE 10/18/2021 1834   PROTEINUR NEGATIVE 10/18/2021 1834   NITRITE NEGATIVE 10/18/2021 1834   LEUKOCYTESUR SMALL (A) 10/18/2021 1834   LEUKOCYTESUR 2+ 01/08/2014 1326    Radiological Exams on Admission: DG Chest Portable 1 View  Result Date: 01/14/2023 CLINICAL DATA:  Chest pain EXAM: PORTABLE CHEST 1 VIEW COMPARISON:  10/18/2021 FINDINGS: Heart and mediastinal contours are within normal limits. No focal opacities or effusions. No acute bony abnormality. Aortic atherosclerosis. IMPRESSION: No active disease. Electronically Signed   By: Charlett Nose M.D.   On: 01/14/2023 00:28     Data Reviewed: Relevant notes from primary care and specialist visits, past discharge summaries as available in EHR, including Care Everywhere. Prior diagnostic testing as pertinent to current admission diagnoses Updated medications and problem lists for reconciliation ED course, including vitals, labs, imaging, treatment and response to treatment Triage notes, nursing and pharmacy notes and ED provider's notes Notable results as noted in HPI   Assessment and Plan: * Precordial chest pain Chest pain resolved with nitroglycerin given by EMS.  Troponins elevated but flat and EKG nonacute History of nonischemic stress test normal echo 2022 Continue home aspirin, metoprolol, simvastatin and losartan Nitroglycerin as needed chest pain Cardiology consult for further risk stratification  Hypertensive urgency SBP in 190s on arrival Continue metoprolol and losartan  Stage 3a chronic kidney disease (HCC) Renal function at baseline  Mild cognitive impairment Delirium precautions  Fibromyalgia Depression Continue paroxetine and gabapentin        DVT prophylaxis: Lovenox  Consults: Cardiology [facility]  Advance Care Planning:   Code Status: Prior   Family Communication: none  Disposition Plan: Back to previous home  environment  Severity of Illness: The appropriate patient status for this patient is OBSERVATION. Observation status is judged to be reasonable and necessary in order to provide the required intensity of service to ensure the patient's safety. The patient's presenting symptoms, physical exam findings, and initial radiographic and laboratory data in the context of their medical condition is felt to place them at decreased risk for further  clinical deterioration. Furthermore, it is anticipated that the patient will be medically stable for discharge from the hospital within 2 midnights of admission.   Author: Andris Baumann, MD 01/14/2023 2:21 AM  For on call review [email or web address].

## 2023-01-14 NOTE — Progress Notes (Signed)
PHARMACIST - PHYSICIAN COMMUNICATION  CONCERNING:  Enoxaparin (Lovenox) for DVT Prophylaxis    RECOMMENDATION: Patient was prescribed enoxaprin 40mg  q24 hours for VTE prophylaxis.   Filed Weights   01/13/23 2253  Weight: 86.9 kg (191 lb 8 oz)    Body mass index is 36.18 kg/m.  Estimated Creatinine Clearance: 56.1 mL/min (by C-G formula based on SCr of 0.68 mg/dL).   Based on [facility] policy patient is candidate for enoxaparin 0.5mg /kg TBW SQ every 24 hours based on BMI being >30.  DESCRIPTION: Pharmacy has adjusted enoxaparin dose per [facility] policy.  Patient is now receiving enoxaparin 0.5 mg/kg every 24 hours   Otelia Sergeant, PharmD, [facility] 01/14/2023 4:26 AM

## 2023-01-15 ENCOUNTER — Observation Stay
Admit: 2023-01-15 | Discharge: 2023-01-15 | Disposition: A | Discharge status: Home / Self Care | Payer: 59 | Attending: Student

## 2023-01-15 DIAGNOSIS — R072 Precordial pain: Secondary | ICD-10-CM | POA: Diagnosis not present

## 2023-01-15 LAB — CBC WITH DIFFERENTIAL/PLATELET
Abs Immature Granulocytes: 0.02 10*3/uL (ref 0.00–0.07)
Basophils Absolute: 0 10*3/uL (ref 0.0–0.1)
Basophils Relative: 0 %
Eosinophils Absolute: 0.3 10*3/uL (ref 0.0–0.5)
Eosinophils Relative: 4 %
HCT: 37.5 % (ref 36.0–46.0)
Hemoglobin: 13 g/dL (ref 12.0–15.0)
Immature Granulocytes: 0 %
Lymphocytes Relative: 17 %
Lymphs Abs: 1.1 10*3/uL (ref 0.7–4.0)
MCH: 32.8 pg (ref 26.0–34.0)
MCHC: 34.7 g/dL (ref 30.0–36.0)
MCV: 94.7 fL (ref 80.0–100.0)
Monocytes Absolute: 0.7 10*3/uL (ref 0.1–1.0)
Monocytes Relative: 11 %
Neutro Abs: 4.4 10*3/uL (ref 1.7–7.7)
Neutrophils Relative %: 68 %
Platelets: 160 10*3/uL (ref 150–400)
RBC: 3.96 MIL/uL (ref 3.87–5.11)
RDW: 12.5 % (ref 11.5–15.5)
WBC: 6.5 10*3/uL (ref 4.0–10.5)
nRBC: 0 % (ref 0.0–0.2)

## 2023-01-15 LAB — BASIC METABOLIC PANEL
Anion gap: 7 (ref 5–15)
BUN: 15 mg/dL (ref 8–23)
CO2: 24 mmol/L (ref 22–32)
Calcium: 8.4 mg/dL — ABNORMAL LOW (ref 8.9–10.3)
Chloride: 104 mmol/L (ref 98–111)
Creatinine, Ser: 0.71 mg/dL (ref 0.44–1.00)
GFR, Estimated: >60 mL/min (ref >60)
Glucose, Bld: 103 mg/dL — ABNORMAL HIGH (ref 70–99)
Potassium: 3.2 mmol/L — ABNORMAL LOW (ref 3.5–5.1)
Sodium: 135 mmol/L (ref 135–145)

## 2023-01-15 LAB — NM MYOCAR MULTI W/SPECT W/WALL MOTION / EF
Estimated workload: 1
Exercise duration (min): 1 min
Exercise duration (sec): 0 s
LV dias vol: 35 mL (ref 46–106)
LV sys vol: 9 mL
MPHR: 140 {beats}/min
Nuc Stress EF: 74 %
Peak HR: 84 {beats}/min
Percent HR: 60 %
Rest HR: 62 {beats}/min
Rest Nuclear Isotope Dose: 9.8 mCi
SDS: 2
SRS: 4
SSS: 5
ST Depression (mm): 0 mm
Stress Nuclear Isotope Dose: 31.9 mCi
TID: 1.5

## 2023-01-15 LAB — ECHOCARDIOGRAM COMPLETE
AR max vel: 2.48 cm2
AV Area VTI: 2.66 cm2
AV Area mean vel: 2.34 cm2
AV Mean grad: 3 mm[Hg]
AV Peak grad: 4.9 mm[Hg]
Ao pk vel: 1.11 m/s
Area-P 1/2: 2.79 cm2
Height: 61 in
MV VTI: 2.1 cm2
S' Lateral: 2.5 cm
Weight: 3142.88 [oz_av]

## 2023-01-15 LAB — BRAIN NATRIURETIC PEPTIDE: B Natriuretic Peptide: 44.8 pg/mL (ref 0.0–100.0)

## 2023-01-15 LAB — LIPOPROTEIN A (LPA): Lipoprotein (a): 21.8 nmol/L (ref <75.0)

## 2023-01-15 MED ORDER — POTASSIUM CHLORIDE CRYS ER 20 MEQ PO TBCR
40.0000 meq | EXTENDED_RELEASE_TABLET | Freq: Once | ORAL | Status: AC
  Administered 2023-01-15: 40 meq via ORAL
  Filled 2023-01-15: qty 2

## 2023-01-15 MED ORDER — ASPIRIN 81 MG PO TBEC: 81.0000 mg | DELAYED_RELEASE_TABLET | Freq: Every day | ORAL | Status: AC

## 2023-01-15 MED ORDER — ISOSORBIDE MONONITRATE ER 60 MG PO TB24: 60.0000 mg | ORAL_TABLET | Freq: Every day | ORAL | 2 refills | Status: AC

## 2023-01-15 MED ORDER — ISOSORBIDE MONONITRATE ER 60 MG PO TB24: 60.0000 mg | ORAL_TABLET | Freq: Every day | ORAL | Status: DC

## 2023-01-15 NOTE — Plan of Care (Signed)
  Problem: Activity: Goal: Ability to tolerate increased activity will improve Outcome: Progressing   Problem: Education: Goal: Knowledge of General Education information will improve Description: Including pain rating scale, medication(s)/side effects and non-pharmacologic comfort measures Outcome: Progressing   Problem: Clinical Measurements: Goal: Respiratory complications will improve Outcome: Progressing   Problem: Clinical Measurements: Goal: Cardiovascular complication will be avoided Outcome: Progressing   Problem: Activity: Goal: Risk for activity intolerance will decrease Outcome: Progressing   Problem: Pain Managment: Goal: General experience of comfort will improve Outcome: Progressing   Problem: Safety: Goal: Ability to remain free from injury will improve Outcome: Progressing

## 2023-01-15 NOTE — Progress Notes (Addendum)
age. Extremities: Warm and well perfused. No clubbing, cyanosis. No edema.  Neuro: Alert and oriented X 3. Psych: Answers questions appropriately.   Labs: Basic Metabolic Panel: Recent Labs    01/13/23 2354 01/15/23 0520  NA 137 135  K 3.5 3.2*  CL 103 104  CO2 24 24  GLUCOSE 177* 103*  BUN 15 15  CREATININE 0.68 0.71  CALCIUM 9.2 8.4*   Liver Function Tests: No results for input(s): "AST", "ALT", "ALKPHOS", "BILITOT", "PROT", "ALBUMIN" in the last 72 hours. No results for input(s): "LIPASE", "AMYLASE" in the last 72 hours. CBC: Recent Labs    01/13/23 2354 01/15/23 0520  WBC 11.5* 6.5  NEUTROABS  --  4.4  HGB 15.0 13.0  HCT 42.3 37.5  MCV 92.8 94.7  PLT 174 160   Cardiac Enzymes: Recent Labs    01/13/23 2354 01/14/23 0111  TROPONINIHS 52* 59*   BNP: Recent Labs    01/15/23 0520  BNP 44.8   D-Dimer: No results for input(s): "DDIMER" in the last 72 hours. Hemoglobin A1C: No results for input(s): "HGBA1C" in the last 72 hours. Fasting Lipid Panel: No results for input(s): "CHOL", "HDL", "LDLCALC", "TRIG", "CHOLHDL", "LDLDIRECT" in the last 72 hours. Thyroid Function Tests: Recent Labs    01/14/23 0753  TSH 0.996   Anemia Panel: No results for input(s): "VITAMINB12", "FOLATE", "FERRITIN", "TIBC", "IRON", "RETICCTPCT" in the last 72 hours.   Radiology: ECHOCARDIOGRAM COMPLETE  Result Date: 01/15/2023    ECHOCARDIOGRAM REPORT   Patient Name:   Alice [name] Date of Exam: 01/15/2023 Medical Rec #:  [id number]         Height:       61.0 in Accession #:    [id number]        Weight:       196.4 lb Date of Birth:  1940/09/11         BSA:           1.874 m Patient Age:    82 years          BP:           129/73 mmHg Patient Gender: F                 HR:           74 bpm. Exam Location:  [facility] Procedure: 2D Echo, Cardiac Doppler and Color Doppler Indications:     Chest pain R07.9  History:         Patient has no prior history of Echocardiogram examinations.                  Signs/Symptoms:Chest Pain; Risk Factors:Hypertension. Migraine.  Sonographer:     Cristela Blue Referring Phys:  [id number] Samrat Hayward Diagnosing Phys: Mellody Drown Alluri IMPRESSIONS  1. Left ventricular ejection fraction, by estimation, is 60 to 65%. The left ventricle has normal function. The left ventricle has no regional wall motion abnormalities. Left ventricular diastolic parameters are consistent with Grade I diastolic dysfunction (impaired relaxation).  2. Right ventricular systolic function is normal. The right ventricular size is normal.  3. The mitral valve is normal in structure. Trivial mitral valve regurgitation.  4. The aortic valve is grossly normal. Aortic valve regurgitation is not visualized. No aortic stenosis is present. FINDINGS  Left Ventricle: Left ventricular ejection fraction, by estimation, is 60 to 65%. The left ventricle has normal function. The left ventricle has no regional wall motion abnormalities. The left ventricular internal cavity size was  age. Extremities: Warm and well perfused. No clubbing, cyanosis. No edema.  Neuro: Alert and oriented X 3. Psych: Answers questions appropriately.   Labs: Basic Metabolic Panel: Recent Labs    01/13/23 2354 01/15/23 0520  NA 137 135  K 3.5 3.2*  CL 103 104  CO2 24 24  GLUCOSE 177* 103*  BUN 15 15  CREATININE 0.68 0.71  CALCIUM 9.2 8.4*   Liver Function Tests: No results for input(s): "AST", "ALT", "ALKPHOS", "BILITOT", "PROT", "ALBUMIN" in the last 72 hours. No results for input(s): "LIPASE", "AMYLASE" in the last 72 hours. CBC: Recent Labs    01/13/23 2354 01/15/23 0520  WBC 11.5* 6.5  NEUTROABS  --  4.4  HGB 15.0 13.0  HCT 42.3 37.5  MCV 92.8 94.7  PLT 174 160   Cardiac Enzymes: Recent Labs    01/13/23 2354 01/14/23 0111  TROPONINIHS 52* 59*   BNP: Recent Labs    01/15/23 0520  BNP 44.8   D-Dimer: No results for input(s): "DDIMER" in the last 72 hours. Hemoglobin A1C: No results for input(s): "HGBA1C" in the last 72 hours. Fasting Lipid Panel: No results for input(s): "CHOL", "HDL", "LDLCALC", "TRIG", "CHOLHDL", "LDLDIRECT" in the last 72 hours. Thyroid Function Tests: Recent Labs    01/14/23 0753  TSH 0.996   Anemia Panel: No results for input(s): "VITAMINB12", "FOLATE", "FERRITIN", "TIBC", "IRON", "RETICCTPCT" in the last 72 hours.   Radiology: ECHOCARDIOGRAM COMPLETE  Result Date: 01/15/2023    ECHOCARDIOGRAM REPORT   Patient Name:   Alice [name] Date of Exam: 01/15/2023 Medical Rec #:  [id number]         Height:       61.0 in Accession #:    [id number]        Weight:       196.4 lb Date of Birth:  1940/09/11         BSA:           1.874 m Patient Age:    82 years          BP:           129/73 mmHg Patient Gender: F                 HR:           74 bpm. Exam Location:  [facility] Procedure: 2D Echo, Cardiac Doppler and Color Doppler Indications:     Chest pain R07.9  History:         Patient has no prior history of Echocardiogram examinations.                  Signs/Symptoms:Chest Pain; Risk Factors:Hypertension. Migraine.  Sonographer:     Cristela Blue Referring Phys:  [id number] Samrat Hayward Diagnosing Phys: Mellody Drown Alluri IMPRESSIONS  1. Left ventricular ejection fraction, by estimation, is 60 to 65%. The left ventricle has normal function. The left ventricle has no regional wall motion abnormalities. Left ventricular diastolic parameters are consistent with Grade I diastolic dysfunction (impaired relaxation).  2. Right ventricular systolic function is normal. The right ventricular size is normal.  3. The mitral valve is normal in structure. Trivial mitral valve regurgitation.  4. The aortic valve is grossly normal. Aortic valve regurgitation is not visualized. No aortic stenosis is present. FINDINGS  Left Ventricle: Left ventricular ejection fraction, by estimation, is 60 to 65%. The left ventricle has normal function. The left ventricle has no regional wall motion abnormalities. The left ventricular internal cavity size was  [facility] CLINIC CARDIOLOGY CONSULT NOTE       Patient ID: Alice [name] MRN: [id number] DOB/AGE: 01-05-1941 82 y.o.  Admit date: 01/13/2023 Referring Physician Dr. Lindajo Royal Primary Physician [facility] Primary Cardiologist Dr. Juliann Pares (last seen 2022) Reason for Consultation chest pain  HPI: Alice [name] is a 82 y.o. female  with a past medical history of  HTN, hyperlipidemia, hypothyroidism and fibromyalgia who presented to the ED on 01/13/2023 for chest pain. Cardiology was consulted for further evaluation.   Interval history: -Patient feeling well overall this AM.  -States she had an episode overnight while laying flat when she felt like she was choking. Reports associated SOB. States symptoms subsided after she sat up. Had another episode later this AM where she started coughing and got choked and had a similar sensation that resolved. -BP and HR controlled this AM and overnight.  -Underwent nuclear stress test yesterday which was low risk.  Review of systems complete and found to be negative unless listed above    Past Medical History:  Diagnosis Date   Anxiety    Arthritis    Depression    Fibromyalgia    GERD (gastroesophageal reflux disease)    History of carpal tunnel syndrome    History of diverticulitis    History of dysphagia    History of vertigo    Hypercholesteremia    Hypertension    Hypothyroidism    Migraine    Neuralgia 2014   Obesity    Polymyalgia rheumatica (HCC)     Past Surgical History:  Procedure Laterality Date   ABDOMINAL HYSTERECTOMY     BREAST CYST ASPIRATION Right +/- 10 yrs ago   x 2 neg   CHOLECYSTECTOMY     COLON SURGERY     COLONOSCOPY  09/08/2006   COLONOSCOPY WITH PROPOFOL N/A 07/16/2017   Procedure: COLONOSCOPY WITH PROPOFOL;  Surgeon: Scot Jun, MD;  Location: [facility] ENDOSCOPY;  Service: Endoscopy;  Laterality: N/A;   ESOPHAGOGASTRODUODENOSCOPY  01/24/2009   HERNIA REPAIR     JOINT REPLACEMENT  Right    knee 2015   THYROIDECTOMY      Medications Prior to Admission  Medication Sig Dispense Refill Last Dose   [employer] D3 5000 units capsule TAKE 5,000 UNITS BY MOUTH ONCE DAILY.  11 01/13/2023   famotidine (PEPCID) 20 MG tablet Take 20 mg by mouth 2 (two) times daily.   01/13/2023   gabapentin (NEURONTIN) 600 MG tablet Take 1 tablet by mouth 3 (three) times daily.   01/13/2023   losartan (COZAAR) 25 MG tablet Take 25 mg by mouth daily.   01/13/2023   metoprolol tartrate (LOPRESSOR) 25 MG tablet Take 1 tablet (25 mg total) by mouth 2 (two) times daily. (Patient taking differently: Take 12.5 mg by mouth 2 (two) times daily.) 60 tablet 0 01/13/2023   Misc Natural Products (GLUCOSAMINE CHONDROITIN ADV PO) Take by mouth.   01/13/2023   PARoxetine (PAXIL) 40 MG tablet Take 40 mg by mouth daily.  99 01/13/2023   simvastatin (ZOCOR) 20 MG tablet Take 20 mg by mouth daily at 6 PM.   Past Week   triamcinolone cream (KENALOG) 0.1 % Apply 1 Application topically 2 (two) times daily. 30 g 0 prn at unknown   Vitamin E 180 MG (400 UNIT) CAPS Take by mouth.   Past Week   aspirin EC 81 MG tablet Take 162 mg by mouth daily. (Patient not taking: Reported on 01/14/2023)   Not Taking  [facility] CLINIC CARDIOLOGY CONSULT NOTE       Patient ID: Alice [name] MRN: [id number] DOB/AGE: 01-05-1941 82 y.o.  Admit date: 01/13/2023 Referring Physician Dr. Lindajo Royal Primary Physician [facility] Primary Cardiologist Dr. Juliann Pares (last seen 2022) Reason for Consultation chest pain  HPI: Alice [name] is a 82 y.o. female  with a past medical history of  HTN, hyperlipidemia, hypothyroidism and fibromyalgia who presented to the ED on 01/13/2023 for chest pain. Cardiology was consulted for further evaluation.   Interval history: -Patient feeling well overall this AM.  -States she had an episode overnight while laying flat when she felt like she was choking. Reports associated SOB. States symptoms subsided after she sat up. Had another episode later this AM where she started coughing and got choked and had a similar sensation that resolved. -BP and HR controlled this AM and overnight.  -Underwent nuclear stress test yesterday which was low risk.  Review of systems complete and found to be negative unless listed above    Past Medical History:  Diagnosis Date   Anxiety    Arthritis    Depression    Fibromyalgia    GERD (gastroesophageal reflux disease)    History of carpal tunnel syndrome    History of diverticulitis    History of dysphagia    History of vertigo    Hypercholesteremia    Hypertension    Hypothyroidism    Migraine    Neuralgia 2014   Obesity    Polymyalgia rheumatica (HCC)     Past Surgical History:  Procedure Laterality Date   ABDOMINAL HYSTERECTOMY     BREAST CYST ASPIRATION Right +/- 10 yrs ago   x 2 neg   CHOLECYSTECTOMY     COLON SURGERY     COLONOSCOPY  09/08/2006   COLONOSCOPY WITH PROPOFOL N/A 07/16/2017   Procedure: COLONOSCOPY WITH PROPOFOL;  Surgeon: Scot Jun, MD;  Location: [facility] ENDOSCOPY;  Service: Endoscopy;  Laterality: N/A;   ESOPHAGOGASTRODUODENOSCOPY  01/24/2009   HERNIA REPAIR     JOINT REPLACEMENT  Right    knee 2015   THYROIDECTOMY      Medications Prior to Admission  Medication Sig Dispense Refill Last Dose   CVS D3 5000 units capsule TAKE 5,000 UNITS BY MOUTH ONCE DAILY.  11 01/13/2023   famotidine (PEPCID) 20 MG tablet Take 20 mg by mouth 2 (two) times daily.   01/13/2023   gabapentin (NEURONTIN) 600 MG tablet Take 1 tablet by mouth 3 (three) times daily.   01/13/2023   losartan (COZAAR) 25 MG tablet Take 25 mg by mouth daily.   01/13/2023   metoprolol tartrate (LOPRESSOR) 25 MG tablet Take 1 tablet (25 mg total) by mouth 2 (two) times daily. (Patient taking differently: Take 12.5 mg by mouth 2 (two) times daily.) 60 tablet 0 01/13/2023   Misc Natural Products (GLUCOSAMINE CHONDROITIN ADV PO) Take by mouth.   01/13/2023   PARoxetine (PAXIL) 40 MG tablet Take 40 mg by mouth daily.  99 01/13/2023   simvastatin (ZOCOR) 20 MG tablet Take 20 mg by mouth daily at 6 PM.   Past Week   triamcinolone cream (KENALOG) 0.1 % Apply 1 Application topically 2 (two) times daily. 30 g 0 prn at unknown   Vitamin E 180 MG (400 UNIT) CAPS Take by mouth.   Past Week   aspirin EC 81 MG tablet Take 162 mg by mouth daily. (Patient not taking: Reported on 01/14/2023)   Not Taking  [facility] CLINIC CARDIOLOGY CONSULT NOTE       Patient ID: Alice [name] MRN: [id number] DOB/AGE: 01-05-1941 82 y.o.  Admit date: 01/13/2023 Referring Physician Dr. Lindajo Royal Primary Physician [facility] Primary Cardiologist Dr. Juliann Pares (last seen 2022) Reason for Consultation chest pain  HPI: Alice [name] is a 82 y.o. female  with a past medical history of  HTN, hyperlipidemia, hypothyroidism and fibromyalgia who presented to the ED on 01/13/2023 for chest pain. Cardiology was consulted for further evaluation.   Interval history: -Patient feeling well overall this AM.  -States she had an episode overnight while laying flat when she felt like she was choking. Reports associated SOB. States symptoms subsided after she sat up. Had another episode later this AM where she started coughing and got choked and had a similar sensation that resolved. -BP and HR controlled this AM and overnight.  -Underwent nuclear stress test yesterday which was low risk.  Review of systems complete and found to be negative unless listed above    Past Medical History:  Diagnosis Date   Anxiety    Arthritis    Depression    Fibromyalgia    GERD (gastroesophageal reflux disease)    History of carpal tunnel syndrome    History of diverticulitis    History of dysphagia    History of vertigo    Hypercholesteremia    Hypertension    Hypothyroidism    Migraine    Neuralgia 2014   Obesity    Polymyalgia rheumatica (HCC)     Past Surgical History:  Procedure Laterality Date   ABDOMINAL HYSTERECTOMY     BREAST CYST ASPIRATION Right +/- 10 yrs ago   x 2 neg   CHOLECYSTECTOMY     COLON SURGERY     COLONOSCOPY  09/08/2006   COLONOSCOPY WITH PROPOFOL N/A 07/16/2017   Procedure: COLONOSCOPY WITH PROPOFOL;  Surgeon: Scot Jun, MD;  Location: [facility] ENDOSCOPY;  Service: Endoscopy;  Laterality: N/A;   ESOPHAGOGASTRODUODENOSCOPY  01/24/2009   HERNIA REPAIR     JOINT REPLACEMENT  Right    knee 2015   THYROIDECTOMY      Medications Prior to Admission  Medication Sig Dispense Refill Last Dose   CVS D3 5000 units capsule TAKE 5,000 UNITS BY MOUTH ONCE DAILY.  11 01/13/2023   famotidine (PEPCID) 20 MG tablet Take 20 mg by mouth 2 (two) times daily.   01/13/2023   gabapentin (NEURONTIN) 600 MG tablet Take 1 tablet by mouth 3 (three) times daily.   01/13/2023   losartan (COZAAR) 25 MG tablet Take 25 mg by mouth daily.   01/13/2023   metoprolol tartrate (LOPRESSOR) 25 MG tablet Take 1 tablet (25 mg total) by mouth 2 (two) times daily. (Patient taking differently: Take 12.5 mg by mouth 2 (two) times daily.) 60 tablet 0 01/13/2023   Misc Natural Products (GLUCOSAMINE CHONDROITIN ADV PO) Take by mouth.   01/13/2023   PARoxetine (PAXIL) 40 MG tablet Take 40 mg by mouth daily.  99 01/13/2023   simvastatin (ZOCOR) 20 MG tablet Take 20 mg by mouth daily at 6 PM.   Past Week   triamcinolone cream (KENALOG) 0.1 % Apply 1 Application topically 2 (two) times daily. 30 g 0 prn at unknown   Vitamin E 180 MG (400 UNIT) CAPS Take by mouth.   Past Week   aspirin EC 81 MG tablet Take 162 mg by mouth daily. (Patient not taking: Reported on 01/14/2023)   Not Taking  [facility] CLINIC CARDIOLOGY CONSULT NOTE       Patient ID: Alice [name] MRN: [id number] DOB/AGE: 01-05-1941 82 y.o.  Admit date: 01/13/2023 Referring Physician Dr. Lindajo Royal Primary Physician [facility] Primary Cardiologist Dr. Juliann Pares (last seen 2022) Reason for Consultation chest pain  HPI: Alice [name] is a 82 y.o. female  with a past medical history of  HTN, hyperlipidemia, hypothyroidism and fibromyalgia who presented to the ED on 01/13/2023 for chest pain. Cardiology was consulted for further evaluation.   Interval history: -Patient feeling well overall this AM.  -States she had an episode overnight while laying flat when she felt like she was choking. Reports associated SOB. States symptoms subsided after she sat up. Had another episode later this AM where she started coughing and got choked and had a similar sensation that resolved. -BP and HR controlled this AM and overnight.  -Underwent nuclear stress test yesterday which was low risk.  Review of systems complete and found to be negative unless listed above    Past Medical History:  Diagnosis Date   Anxiety    Arthritis    Depression    Fibromyalgia    GERD (gastroesophageal reflux disease)    History of carpal tunnel syndrome    History of diverticulitis    History of dysphagia    History of vertigo    Hypercholesteremia    Hypertension    Hypothyroidism    Migraine    Neuralgia 2014   Obesity    Polymyalgia rheumatica (HCC)     Past Surgical History:  Procedure Laterality Date   ABDOMINAL HYSTERECTOMY     BREAST CYST ASPIRATION Right +/- 10 yrs ago   x 2 neg   CHOLECYSTECTOMY     COLON SURGERY     COLONOSCOPY  09/08/2006   COLONOSCOPY WITH PROPOFOL N/A 07/16/2017   Procedure: COLONOSCOPY WITH PROPOFOL;  Surgeon: Scot Jun, MD;  Location: [facility] ENDOSCOPY;  Service: Endoscopy;  Laterality: N/A;   ESOPHAGOGASTRODUODENOSCOPY  01/24/2009   HERNIA REPAIR     JOINT REPLACEMENT  Right    knee 2015   THYROIDECTOMY      Medications Prior to Admission  Medication Sig Dispense Refill Last Dose   CVS D3 5000 units capsule TAKE 5,000 UNITS BY MOUTH ONCE DAILY.  11 01/13/2023   famotidine (PEPCID) 20 MG tablet Take 20 mg by mouth 2 (two) times daily.   01/13/2023   gabapentin (NEURONTIN) 600 MG tablet Take 1 tablet by mouth 3 (three) times daily.   01/13/2023   losartan (COZAAR) 25 MG tablet Take 25 mg by mouth daily.   01/13/2023   metoprolol tartrate (LOPRESSOR) 25 MG tablet Take 1 tablet (25 mg total) by mouth 2 (two) times daily. (Patient taking differently: Take 12.5 mg by mouth 2 (two) times daily.) 60 tablet 0 01/13/2023   Misc Natural Products (GLUCOSAMINE CHONDROITIN ADV PO) Take by mouth.   01/13/2023   PARoxetine (PAXIL) 40 MG tablet Take 40 mg by mouth daily.  99 01/13/2023   simvastatin (ZOCOR) 20 MG tablet Take 20 mg by mouth daily at 6 PM.   Past Week   triamcinolone cream (KENALOG) 0.1 % Apply 1 Application topically 2 (two) times daily. 30 g 0 prn at unknown   Vitamin E 180 MG (400 UNIT) CAPS Take by mouth.   Past Week   aspirin EC 81 MG tablet Take 162 mg by mouth daily. (Patient not taking: Reported on 01/14/2023)   Not Taking

## 2023-01-15 NOTE — Discharge Summary (Signed)
medications    Cranberry 500 MG Caps   erythromycin ophthalmic ointment   Premarin vaginal cream Generic drug: conjugated estrogens       TAKE these medications    aspirin EC 81 MG tablet Take 1 tablet (81 mg total) by mouth daily. Swallow whole. Start taking on: January 16, 2023 What changed:  how much to take additional instructions   [employer] D3 125 MCG (5000 UT)  capsule Generic drug: Cholecalciferol TAKE 5,000 UNITS BY MOUTH ONCE DAILY.   famotidine 20 MG tablet Commonly known as: PEPCID Take 20 mg by mouth 2 (two) times daily.   gabapentin 600 MG tablet Commonly known as: NEURONTIN Take 1 tablet by mouth 3 (three) times daily.   GLUCOSAMINE CHONDROITIN ADV PO Take by mouth.   isosorbide mononitrate 60 MG 24 hr tablet Commonly known as: IMDUR Take 1 tablet (60 mg total) by mouth daily. Start taking on: January 16, 2023   losartan 25 MG tablet Commonly known as: COZAAR Take 25 mg by mouth daily.   metoprolol tartrate 25 MG tablet Commonly known as: LOPRESSOR Take 1 tablet (25 mg total) by mouth 2 (two) times daily. What changed: how much to take   PARoxetine 40 MG tablet Commonly known as: PAXIL Take 40 mg by mouth daily.   simvastatin 20 MG tablet Commonly known as: ZOCOR Take 20 mg by mouth daily at 6 PM.   triamcinolone cream 0.1 % Commonly known as: KENALOG Apply 1 Application topically 2 (two) times daily.   Vitamin E 180 MG (400 UNIT) Caps Take by mouth.        Follow-up Information     Alwyn Pea, MD. Go in 1 week(s).   Specialties: Cardiology, Internal Medicine Contact information: [address] [place] [address] [phone]                Discharge Exam: Ceas[facility]ights   01/13/23 2253 01/15/23 0330  Weight: 86.9 kg 89.1 kg   General exam: awake, alert, no acute distress HEENT: moist mucus membranes, hearing grossly normal  Respiratory system: CTAB, no wheezes, rales or rhonchi, normal respiratory effort. Cardiovascular system: normal S1/S2, RRR, no pedal edema.   Gastrointestinal system: soft, NT, ND Central nervous system: A&O x 3. no gross focal neurologic deficits, normal speech Extremities: moves all, no edema, normal tone Skin: dry, intact, normal temperature Psychiatry: normal mood, congruent affect, judgement and insight appear normal   Condition at discharge:  stable  The results of significant diagnostics from this hospitalization (including imaging, microbiology, ancillary and laboratory) are listed below for reference.   Imaging Studies: ECHOCARDIOGRAM COMPLETE  Result Date: 01/15/2023    ECHOCARDIOGRAM REPORT   Patient Name:   Alice [name] Date of Exam: 01/15/2023 Medical Rec #:  [id number]         Height:       61.0 in Accession #:    [id number]        Weight:       196.4 lb Date of Birth:  [birth year]         BSA:          1.874 m Patient Age:    82 years          BP:           129/73 mmHg Patient Gender: F                 HR:           74  medications    Cranberry 500 MG Caps   erythromycin ophthalmic ointment   Premarin vaginal cream Generic drug: conjugated estrogens       TAKE these medications    aspirin EC 81 MG tablet Take 1 tablet (81 mg total) by mouth daily. Swallow whole. Start taking on: January 16, 2023 What changed:  how much to take additional instructions   [employer] D3 125 MCG (5000 UT)  capsule Generic drug: Cholecalciferol TAKE 5,000 UNITS BY MOUTH ONCE DAILY.   famotidine 20 MG tablet Commonly known as: PEPCID Take 20 mg by mouth 2 (two) times daily.   gabapentin 600 MG tablet Commonly known as: NEURONTIN Take 1 tablet by mouth 3 (three) times daily.   GLUCOSAMINE CHONDROITIN ADV PO Take by mouth.   isosorbide mononitrate 60 MG 24 hr tablet Commonly known as: IMDUR Take 1 tablet (60 mg total) by mouth daily. Start taking on: January 16, 2023   losartan 25 MG tablet Commonly known as: COZAAR Take 25 mg by mouth daily.   metoprolol tartrate 25 MG tablet Commonly known as: LOPRESSOR Take 1 tablet (25 mg total) by mouth 2 (two) times daily. What changed: how much to take   PARoxetine 40 MG tablet Commonly known as: PAXIL Take 40 mg by mouth daily.   simvastatin 20 MG tablet Commonly known as: ZOCOR Take 20 mg by mouth daily at 6 PM.   triamcinolone cream 0.1 % Commonly known as: KENALOG Apply 1 Application topically 2 (two) times daily.   Vitamin E 180 MG (400 UNIT) Caps Take by mouth.        Follow-up Information     Alwyn Pea, MD. Go in 1 week(s).   Specialties: Cardiology, Internal Medicine Contact information: [address] [place] [address] [phone]                Discharge Exam: Ceasar Mons Weights   01/13/23 2253 01/15/23 0330  Weight: 86.9 kg 89.1 kg   General exam: awake, alert, no acute distress HEENT: moist mucus membranes, hearing grossly normal  Respiratory system: CTAB, no wheezes, rales or rhonchi, normal respiratory effort. Cardiovascular system: normal S1/S2, RRR, no pedal edema.   Gastrointestinal system: soft, NT, ND Central nervous system: A&O x 3. no gross focal neurologic deficits, normal speech Extremities: moves all, no edema, normal tone Skin: dry, intact, normal temperature Psychiatry: normal mood, congruent affect, judgement and insight appear normal   Condition at discharge:  stable  The results of significant diagnostics from this hospitalization (including imaging, microbiology, ancillary and laboratory) are listed below for reference.   Imaging Studies: ECHOCARDIOGRAM COMPLETE  Result Date: 01/15/2023    ECHOCARDIOGRAM REPORT   Patient Name:   Alice [name] Date of Exam: 01/15/2023 Medical Rec #:  [id number]         Height:       61.0 in Accession #:    [id number]        Weight:       196.4 lb Date of Birth:  [birth year]         BSA:          1.874 m Patient Age:    82 years          BP:           129/73 mmHg Patient Gender: F                 HR:           74  Physician Discharge Summary   Patient: Alice [name] MRN: [id number] DOB: [birth year]  Admit date:     01/13/2023  Discharge date: 01/15/2023  Discharge Physician: Pennie Banter   [facility]P: [facility]   Recommendations at discharge:    Follow up with Cardiology Follow up with GI as scheduled Follow up with Primary Care Repeat CBC, BMP, Mg at follow or as needed  Discharge Diagnoses: Principal Problem:   Precordial chest pain Active Problems:   Chest pain   Hypertensive urgency   Depression   Fibromyalgia   Hypothyroidism   Mild cognitive impairment   Stage 3a chronic kidney disease (HCC)  Resolved Problems:   * No resolved hospital problems. *  Hospital Course:  From HPI "Alice [name] is a 82 y.o. female with medical history significant for HTN, hypothyroidism and fibromyalgia who presents to the ED for evaluation of chest tightness that started at rest just prior to presentation.  Pain radiated down both her arms.  She had no associated nausea, vomiting, diaphoresis, shortness of breath or lightheadedness.  Denies any leg pain or swelling.  EMS administered sublingual nitroglycerin on their arrival with complete resolution in pain.  Of note, patient had a negative diagnostic cardiac workup (stress test and echocardiogram) in May 2022 that was indicated for elevated troponin in the setting of hypertensive emergency ED course and data review: BP 197/101 on arrival, improving on its own to 143/70.  Vitals otherwise within normal limits., EKG personally viewed and interpreted showing sinus rhythm at 70 with no acute ST-T wave changes.  "  Further hospital course and management as outlined below.   10/2 -- pt doing well. Cleared for d/c by Cardiology. Pt clinically improved & medically stable for discharge, pt agreeable.    Assessment and Plan:  *Atypical chest pain Chest pain resolved with nitroglycerin given by EMS.  Troponins elevated but flat and EKG  nonacute History of nonischemic stress test normal echo 2022 --Cardiology consulted --Stress test done & normal, no ischemia --Echo - normal EF, no WMA's, no signficant valvular disease --Suspect related to esophageal issues for which pt is scheduled for outpatient evaluation and had recent swallow study.  --Continue meds: aspirin, metoprolol, simvastatin and losartan --Imdur was added --Cardiology follow up outpatient   Hypertensive urgency SBP in 190s on arrival Continue metoprolol and losartan Monitor blood pressure closely   Stage 3a chronic kidney disease (HCC) Renal function at baseline   Mild cognitive impairment Delirium precautions   Fibromyalgia Depression Continue paroxetine and gabapentin       Consultants: Cardiology Procedures performed: Stress test, echo - see reports   Disposition: Home  Diet recommendation:  Discharge Diet Orders (From admission, onward)     Start     Ordered   01/15/23 0000  Diet - low sodium heart healthy        01/15/23 1402           Carb modified diet DISCHARGE MEDICATION: Allergies as of 01/15/2023       Reactions   Shrimp Extract Anaphylaxis   Meperidine Hcl Other (See Comments)   Milk (cow) Other (See Comments)   Lactose Intolerance, headache LactoseIntolerance Lactose Intolerance, headache   Milk-related Compounds    Peanut Oil Other (See Comments)   Headache Headache   Peanut-containing Drug Products    Meperidine Other (See Comments), Palpitations   RAYS COMES OUT OF EYES        Medication List     STOP taking these  Physician Discharge Summary   Patient: Alice [name] MRN: [id number] DOB: [birth year]  Admit date:     01/13/2023  Discharge date: 01/15/2023  Discharge Physician: Pennie Banter   PCP: [facility]   Recommendations at discharge:    Follow up with Cardiology Follow up with GI as scheduled Follow up with Primary Care Repeat CBC, BMP, Mg at follow or as needed  Discharge Diagnoses: Principal Problem:   Precordial chest pain Active Problems:   Chest pain   Hypertensive urgency   Depression   Fibromyalgia   Hypothyroidism   Mild cognitive impairment   Stage 3a chronic kidney disease (HCC)  Resolved Problems:   * No resolved hospital problems. *  Hospital Course:  From HPI "Alice [name] is a 82 y.o. female with medical history significant for HTN, hypothyroidism and fibromyalgia who presents to the ED for evaluation of chest tightness that started at rest just prior to presentation.  Pain radiated down both her arms.  She had no associated nausea, vomiting, diaphoresis, shortness of breath or lightheadedness.  Denies any leg pain or swelling.  EMS administered sublingual nitroglycerin on their arrival with complete resolution in pain.  Of note, patient had a negative diagnostic cardiac workup (stress test and echocardiogram) in May 2022 that was indicated for elevated troponin in the setting of hypertensive emergency ED course and data review: BP 197/101 on arrival, improving on its own to 143/70.  Vitals otherwise within normal limits., EKG personally viewed and interpreted showing sinus rhythm at 70 with no acute ST-T wave changes.  "  Further hospital course and management as outlined below.   10/2 -- pt doing well. Cleared for d/c by Cardiology. Pt clinically improved & medically stable for discharge, pt agreeable.    Assessment and Plan:  *Atypical chest pain Chest pain resolved with nitroglycerin given by EMS.  Troponins elevated but flat and EKG  nonacute History of nonischemic stress test normal echo 2022 --Cardiology consulted --Stress test done & normal, no ischemia --Echo - normal EF, no WMA's, no signficant valvular disease --Suspect related to esophageal issues for which pt is scheduled for outpatient evaluation and had recent swallow study.  --Continue meds: aspirin, metoprolol, simvastatin and losartan --Imdur was added --Cardiology follow up outpatient   Hypertensive urgency SBP in 190s on arrival Continue metoprolol and losartan Monitor blood pressure closely   Stage 3a chronic kidney disease (HCC) Renal function at baseline   Mild cognitive impairment Delirium precautions   Fibromyalgia Depression Continue paroxetine and gabapentin       Consultants: Cardiology Procedures performed: Stress test, echo - see reports   Disposition: Home  Diet recommendation:  Discharge Diet Orders (From admission, onward)     Start     Ordered   01/15/23 0000  Diet - low sodium heart healthy        01/15/23 1402           Carb modified diet DISCHARGE MEDICATION: Allergies as of 01/15/2023       Reactions   Shrimp Extract Anaphylaxis   Meperidine Hcl Other (See Comments)   Milk (cow) Other (See Comments)   Lactose Intolerance, headache LactoseIntolerance Lactose Intolerance, headache   Milk-related Compounds    Peanut Oil Other (See Comments)   Headache Headache   Peanut-containing Drug Products    Meperidine Other (See Comments), Palpitations   RAYS COMES OUT OF EYES        Medication List     STOP taking these  Physician Discharge Summary   Patient: Alice [name] MRN: [id number] DOB: [birth year]  Admit date:     01/13/2023  Discharge date: 01/15/2023  Discharge Physician: Pennie Banter   [facility]P: [facility]   Recommendations at discharge:    Follow up with Cardiology Follow up with GI as scheduled Follow up with Primary Care Repeat CBC, BMP, Mg at follow or as needed  Discharge Diagnoses: Principal Problem:   Precordial chest pain Active Problems:   Chest pain   Hypertensive urgency   Depression   Fibromyalgia   Hypothyroidism   Mild cognitive impairment   Stage 3a chronic kidney disease (HCC)  Resolved Problems:   * No resolved hospital problems. *  Hospital Course:  From HPI "Alice [name] is a 82 y.o. female with medical history significant for HTN, hypothyroidism and fibromyalgia who presents to the ED for evaluation of chest tightness that started at rest just prior to presentation.  Pain radiated down both her arms.  She had no associated nausea, vomiting, diaphoresis, shortness of breath or lightheadedness.  Denies any leg pain or swelling.  EMS administered sublingual nitroglycerin on their arrival with complete resolution in pain.  Of note, patient had a negative diagnostic cardiac workup (stress test and echocardiogram) in May 2022 that was indicated for elevated troponin in the setting of hypertensive emergency ED course and data review: BP 197/101 on arrival, improving on its own to 143/70.  Vitals otherwise within normal limits., EKG personally viewed and interpreted showing sinus rhythm at 70 with no acute ST-T wave changes.  "  Further hospital course and management as outlined below.   10/2 -- pt doing well. Cleared for d/c by Cardiology. Pt clinically improved & medically stable for discharge, pt agreeable.    Assessment and Plan:  *Atypical chest pain Chest pain resolved with nitroglycerin given by EMS.  Troponins elevated but flat and EKG  nonacute History of nonischemic stress test normal echo 2022 --Cardiology consulted --Stress test done & normal, no ischemia --Echo - normal EF, no WMA's, no signficant valvular disease --Suspect related to esophageal issues for which pt is scheduled for outpatient evaluation and had recent swallow study.  --Continue meds: aspirin, metoprolol, simvastatin and losartan --Imdur was added --Cardiology follow up outpatient   Hypertensive urgency SBP in 190s on arrival Continue metoprolol and losartan Monitor blood pressure closely   Stage 3a chronic kidney disease (HCC) Renal function at baseline   Mild cognitive impairment Delirium precautions   Fibromyalgia Depression Continue paroxetine and gabapentin       Consultants: Cardiology Procedures performed: Stress test, echo - see reports   Disposition: Home  Diet recommendation:  Discharge Diet Orders (From admission, onward)     Start     Ordered   01/15/23 0000  Diet - low sodium heart healthy        01/15/23 1402           Carb modified diet DISCHARGE MEDICATION: Allergies as of 01/15/2023       Reactions   Shrimp Extract Anaphylaxis   Meperidine Hcl Other (See Comments)   Milk (cow) Other (See Comments)   Lactose Intolerance, headache LactoseIntolerance Lactose Intolerance, headache   Milk-related Compounds    Peanut Oil Other (See Comments)   Headache Headache   Peanut-containing Drug Products    Meperidine Other (See Comments), Palpitations   RAYS COMES OUT OF EYES        Medication List     STOP taking these  Physician Discharge Summary   Patient: Alice [name] MRN: [id number] DOB: [birth year]  Admit date:     01/13/2023  Discharge date: 01/15/2023  Discharge Physician: Pennie Banter   [facility]P: [facility]   Recommendations at discharge:    Follow up with Cardiology Follow up with GI as scheduled Follow up with Primary Care Repeat CBC, BMP, Mg at follow or as needed  Discharge Diagnoses: Principal Problem:   Precordial chest pain Active Problems:   Chest pain   Hypertensive urgency   Depression   Fibromyalgia   Hypothyroidism   Mild cognitive impairment   Stage 3a chronic kidney disease (HCC)  Resolved Problems:   * No resolved hospital problems. *  Hospital Course:  From HPI "Alice [name] is a 82 y.o. female with medical history significant for HTN, hypothyroidism and fibromyalgia who presents to the ED for evaluation of chest tightness that started at rest just prior to presentation.  Pain radiated down both her arms.  She had no associated nausea, vomiting, diaphoresis, shortness of breath or lightheadedness.  Denies any leg pain or swelling.  EMS administered sublingual nitroglycerin on their arrival with complete resolution in pain.  Of note, patient had a negative diagnostic cardiac workup (stress test and echocardiogram) in May 2022 that was indicated for elevated troponin in the setting of hypertensive emergency ED course and data review: BP 197/101 on arrival, improving on its own to 143/70.  Vitals otherwise within normal limits., EKG personally viewed and interpreted showing sinus rhythm at 70 with no acute ST-T wave changes.  "  Further hospital course and management as outlined below.   10/2 -- pt doing well. Cleared for d/c by Cardiology. Pt clinically improved & medically stable for discharge, pt agreeable.    Assessment and Plan:  *Atypical chest pain Chest pain resolved with nitroglycerin given by EMS.  Troponins elevated but flat and EKG  nonacute History of nonischemic stress test normal echo 2022 --Cardiology consulted --Stress test done & normal, no ischemia --Echo - normal EF, no WMA's, no signficant valvular disease --Suspect related to esophageal issues for which pt is scheduled for outpatient evaluation and had recent swallow study.  --Continue meds: aspirin, metoprolol, simvastatin and losartan --Imdur was added --Cardiology follow up outpatient   Hypertensive urgency SBP in 190s on arrival Continue metoprolol and losartan Monitor blood pressure closely   Stage 3a chronic kidney disease (HCC) Renal function at baseline   Mild cognitive impairment Delirium precautions   Fibromyalgia Depression Continue paroxetine and gabapentin       Consultants: Cardiology Procedures performed: Stress test, echo - see reports   Disposition: Home  Diet recommendation:  Discharge Diet Orders (From admission, onward)     Start     Ordered   01/15/23 0000  Diet - low sodium heart healthy        01/15/23 1402           Carb modified diet DISCHARGE MEDICATION: Allergies as of 01/15/2023       Reactions   Shrimp Extract Anaphylaxis   Meperidine Hcl Other (See Comments)   Milk (cow) Other (See Comments)   Lactose Intolerance, headache LactoseIntolerance Lactose Intolerance, headache   Milk-related Compounds    Peanut Oil Other (See Comments)   Headache Headache   Peanut-containing Drug Products    Meperidine Other (See Comments), Palpitations   RAYS COMES OUT OF EYES        Medication List     STOP taking these

## 2023-02-25 ENCOUNTER — Encounter: Payer: Self-pay | Admitting: *Deleted

## 2023-03-03 ENCOUNTER — Encounter: Payer: Self-pay | Admitting: *Deleted

## 2023-03-10 ENCOUNTER — Ambulatory Visit: Payer: 59 | Admitting: Certified Registered"

## 2023-03-10 ENCOUNTER — Encounter
Admission: RE | Disposition: A | Discharge status: Home / Self Care | Payer: Self-pay | Source: Home / Self Care | Attending: Gastroenterology

## 2023-03-10 ENCOUNTER — Encounter: Payer: Self-pay | Admitting: *Deleted

## 2023-03-10 ENCOUNTER — Ambulatory Visit
Admission: RE | Admit: 2023-03-10 | Discharge: 2023-03-10 | Disposition: A | Discharge status: Home / Self Care | Payer: 59 | Attending: Gastroenterology | Admitting: Gastroenterology

## 2023-03-10 ENCOUNTER — Other Ambulatory Visit: Payer: Self-pay

## 2023-03-10 DIAGNOSIS — Z87891 Personal history of nicotine dependence: Secondary | ICD-10-CM | POA: Diagnosis not present

## 2023-03-10 DIAGNOSIS — M797 Fibromyalgia: Secondary | ICD-10-CM | POA: Insufficient documentation

## 2023-03-10 DIAGNOSIS — K2970 Gastritis, unspecified, without bleeding: Secondary | ICD-10-CM | POA: Diagnosis not present

## 2023-03-10 DIAGNOSIS — R519 Headache, unspecified: Secondary | ICD-10-CM | POA: Insufficient documentation

## 2023-03-10 DIAGNOSIS — Z7982 Long term (current) use of aspirin: Secondary | ICD-10-CM | POA: Insufficient documentation

## 2023-03-10 DIAGNOSIS — M1990 Unspecified osteoarthritis, unspecified site: Secondary | ICD-10-CM | POA: Insufficient documentation

## 2023-03-10 DIAGNOSIS — N189 Chronic kidney disease, unspecified: Secondary | ICD-10-CM | POA: Insufficient documentation

## 2023-03-10 DIAGNOSIS — Z6838 Body mass index (BMI) 38.0-38.9, adult: Secondary | ICD-10-CM | POA: Insufficient documentation

## 2023-03-10 DIAGNOSIS — D122 Benign neoplasm of ascending colon: Secondary | ICD-10-CM | POA: Insufficient documentation

## 2023-03-10 DIAGNOSIS — Z79899 Other long term (current) drug therapy: Secondary | ICD-10-CM | POA: Diagnosis not present

## 2023-03-10 DIAGNOSIS — E7800 Pure hypercholesterolemia, unspecified: Secondary | ICD-10-CM | POA: Insufficient documentation

## 2023-03-10 DIAGNOSIS — F419 Anxiety disorder, unspecified: Secondary | ICD-10-CM | POA: Diagnosis not present

## 2023-03-10 DIAGNOSIS — R1310 Dysphagia, unspecified: Secondary | ICD-10-CM | POA: Insufficient documentation

## 2023-03-10 DIAGNOSIS — I700 Atherosclerosis of aorta: Secondary | ICD-10-CM | POA: Diagnosis not present

## 2023-03-10 DIAGNOSIS — I130 Hypertensive heart and chronic kidney disease with heart failure and stage 1 through stage 4 chronic kidney disease, or unspecified chronic kidney disease: Secondary | ICD-10-CM | POA: Diagnosis not present

## 2023-03-10 DIAGNOSIS — Z860101 Personal history of adenomatous and serrated colon polyps: Secondary | ICD-10-CM | POA: Insufficient documentation

## 2023-03-10 DIAGNOSIS — K5710 Diverticulosis of small intestine without perforation or abscess without bleeding: Secondary | ICD-10-CM | POA: Insufficient documentation

## 2023-03-10 DIAGNOSIS — Z1211 Encounter for screening for malignant neoplasm of colon: Secondary | ICD-10-CM | POA: Diagnosis present

## 2023-03-10 DIAGNOSIS — F32A Depression, unspecified: Secondary | ICD-10-CM | POA: Diagnosis not present

## 2023-03-10 DIAGNOSIS — E039 Hypothyroidism, unspecified: Secondary | ICD-10-CM | POA: Diagnosis not present

## 2023-03-10 DIAGNOSIS — I509 Heart failure, unspecified: Secondary | ICD-10-CM | POA: Insufficient documentation

## 2023-03-10 DIAGNOSIS — K219 Gastro-esophageal reflux disease without esophagitis: Secondary | ICD-10-CM | POA: Insufficient documentation

## 2023-03-10 DIAGNOSIS — M353 Polymyalgia rheumatica: Secondary | ICD-10-CM | POA: Diagnosis not present

## 2023-03-10 DIAGNOSIS — E669 Obesity, unspecified: Secondary | ICD-10-CM | POA: Diagnosis not present

## 2023-03-10 DIAGNOSIS — K5750 Diverticulosis of both small and large intestine without perforation or abscess without bleeding: Secondary | ICD-10-CM | POA: Diagnosis not present

## 2023-03-10 HISTORY — PX: COLONOSCOPY WITH PROPOFOL: SHX5780

## 2023-03-10 HISTORY — PX: ESOPHAGOGASTRODUODENOSCOPY (EGD) WITH PROPOFOL: SHX5813

## 2023-03-10 HISTORY — PX: POLYPECTOMY: SHX5525

## 2023-03-10 HISTORY — DX: Mild cognitive impairment of uncertain or unknown etiology: G31.84

## 2023-03-10 HISTORY — DX: Personal history of adenomatous and serrated colon polyps: Z86.0101

## 2023-03-10 HISTORY — DX: Radiculopathy, lumbar region: M54.16

## 2023-03-10 HISTORY — DX: Full incontinence of feces: R15.9

## 2023-03-10 HISTORY — PX: BIOPSY: SHX5522

## 2023-03-10 HISTORY — DX: Unspecified abnormalities of gait and mobility: R26.9

## 2023-03-10 HISTORY — DX: Chronic kidney disease, unspecified: N18.9

## 2023-03-10 HISTORY — DX: Atherosclerosis of aorta: I70.0

## 2023-03-10 HISTORY — DX: Diverticulosis of intestine, part unspecified, without perforation or abscess without bleeding: K57.90

## 2023-03-10 SURGERY — ESOPHAGOGASTRODUODENOSCOPY (EGD) WITH PROPOFOL
Anesthesia: General

## 2023-03-10 MED ORDER — PROPOFOL 10 MG/ML IV BOLUS
INTRAVENOUS | Status: DC | PRN
Start: 2023-03-10 — End: 2023-03-10
  Administered 2023-03-10: 10 mg via INTRAVENOUS
  Administered 2023-03-10: 70 mg via INTRAVENOUS

## 2023-03-10 MED ORDER — SODIUM CHLORIDE 0.9 % IV SOLN: INTRAVENOUS | Status: DC

## 2023-03-10 MED ORDER — PROPOFOL 500 MG/50ML IV EMUL
INTRAVENOUS | Status: DC | PRN
  Administered 2023-03-10: 100 ug/kg/min via INTRAVENOUS

## 2023-03-10 MED ORDER — PROPOFOL 1000 MG/100ML IV EMUL
INTRAVENOUS | Status: AC
  Filled 2023-03-10: qty 100

## 2023-03-10 MED ORDER — LIDOCAINE HCL (CARDIAC) PF 100 MG/5ML IV SOSY
PREFILLED_SYRINGE | INTRAVENOUS | Status: DC | PRN
  Administered 2023-03-10: 60 mg via INTRAVENOUS

## 2023-03-10 NOTE — Op Note (Signed)
[facility] Gastroenterology Patient Name: Alice [name] Procedure Date: 03/10/2023 9:57 AM MRN: [id number] Account #: [id number] Date of Birth: 1940/12/14 Admit Type: Outpatient Age: 82 Room: [facility] ENDO ROOM 3 Gender: Female Note Status: Finalized Instrument Name: Nelda Marseille [id number] Procedure:             Colonoscopy Indications:           High risk colon cancer surveillance: Personal history                         of colonic polyps, Last colonoscopy 5 years ago Providers:             Eather Colas MD, MD Referring MD:          Eather Colas MD, MD (Referring MD), [facility] [place] (Referring MD) Medicines:             Monitored Anesthesia Care Complications:         No immediate complications. Procedure:             Pre-Anesthesia Assessment:                        - Prior to the procedure, a History and Physical was                         performed, and patient medications and allergies were                         reviewed. The patient is competent. The risks and                         benefits of the procedure and the sedation options and                         risks were discussed with the patient. All questions                         were answered and informed consent was obtained.                         Patient identification and proposed procedure were                         verified by the physician, the nurse, the                         anesthesiologist, the anesthetist and the technician                         in the endoscopy suite. Mental Status Examination:                         alert and oriented. Airway Examination: normal                         oropharyngeal airway and neck mobility. Respiratory  Examination: clear to auscultation. CV Examination:                         normal. Prophylactic Antibiotics: The patient does not                         require prophylactic  antibiotics. Prior                         Anticoagulants: The patient has taken no anticoagulant                         or antiplatelet agents. ASA Grade Assessment: III - A                         patient with severe systemic disease. After reviewing                         the risks and benefits, the patient was deemed in                         satisfactory condition to undergo the procedure. The                         anesthesia plan was to use monitored anesthesia care                         (MAC). Immediately prior to administration of                         medications, the patient was re-assessed for adequacy                         to receive sedatives. The heart rate, respiratory                         rate, oxygen saturations, blood pressure, adequacy of                         pulmonary ventilation, and response to care were                         monitored throughout the procedure. The physical                         status of the patient was re-assessed after the                         procedure.                        After obtaining informed consent, the colonoscope was                         passed under direct vision. Throughout the procedure,                         the patient's blood pressure, pulse, and oxygen  saturations were monitored continuously. The                         Colonoscope was introduced through the anus and                         advanced to the the cecum, identified by appendiceal                         orifice and ileocecal valve. The colonoscopy was                         somewhat difficult due to a redundant colon. The                         patient tolerated the procedure well. The quality of                         the bowel preparation was fair. The ileocecal valve,                         appendiceal orifice, and rectum were photographed. Findings:      Prolapsed rectal tissue observed      A 1 mm polyp  was found in the ascending colon. The polyp was sessile.       The polyp was removed with a jumbo cold forceps. Resection and retrieval       were complete. Estimated blood loss was minimal.      A 3 mm polyp was found in the ascending colon. The polyp was sessile.       The polyp was removed with a cold snare. Resection and retrieval were       complete. Estimated blood loss was minimal.      Many large-mouthed and small-mouthed diverticula were found in the       sigmoid colon, descending colon, splenic flexure, transverse colon,       hepatic flexure, ascending colon and cecum.      Retroflexion in the rectum was not performed due to anatomy. Impression:            - Preparation of the colon was fair.                        - One 1 mm polyp in the ascending colon, removed with                         a jumbo cold forceps. Resected and retrieved.                        - One 3 mm polyp in the ascending colon, removed with                         a cold snare. Resected and retrieved.                        - Diverticulosis in the sigmoid colon, in the                         descending colon, at the splenic flexure, in  the                         transverse colon, at the hepatic flexure, in the                         ascending colon and in the cecum. Recommendation:        - Discharge patient to home.                        - Resume previous diet.                        - Continue present medications.                        - Await pathology results.                        - Repeat colonoscopy is not recommended due to current                         age (77 years or older) for surveillance.                        - Return to referring physician as previously                         scheduled. Procedure Code(s):     --- Professional ---                        [phone], Colonoscopy, flexible; with removal of                         tumor(s), polyp(s), or other lesion(s) by snare                          technique                        45380, 59, Colonoscopy, flexible; with biopsy, single                         or multiple Diagnosis Code(s):     --- Professional ---                        Z86.010, Personal history of colonic polyps                        D12.2, Benign neoplasm of ascending colon                        K57.30, Diverticulosis of large intestine without                         perforation or abscess without bleeding CPT copyright 2022 American Medical Association. All rights reserved. The codes documented in this report are preliminary and upon coder review may  be revised to meet current compliance requirements. Eather Colas MD, MD 03/10/2023 10:58:18 AM Number of Addenda: 0 Note Initiated On: 03/10/2023 9:57 AM Scope Withdrawal  Time: 0 hours 13 minutes 11 seconds  Total Procedure Duration: 0 hours 23 minutes 4 seconds  Estimated Blood Loss:  Estimated blood loss was minimal.      [facility]

## 2023-03-10 NOTE — Anesthesia Preprocedure Evaluation (Signed)
Anesthesia Evaluation  Patient identified by MRN, date of birth, ID band Patient awake    Reviewed: Allergy & Precautions, NPO status , Patient's Chart, lab work & pertinent test results, reviewed documented beta blocker date and time   Airway Mallampati: III  TM Distance: >3 FB Neck ROM: full    Dental  (+) Chipped   Pulmonary neg pulmonary ROS, former smoker   Pulmonary exam normal        Cardiovascular hypertension, Pt. on medications +CHF  negative cardio ROS Normal cardiovascular exam     Neuro/Psych  Headaches PSYCHIATRIC DISORDERS Anxiety Depression     Neuromuscular disease negative neurological ROS  negative psych ROS   GI/Hepatic negative GI ROS, Neg liver ROS,GERD  ,,  Endo/Other  negative endocrine ROSHypothyroidism    Renal/GU negative Renal ROS  negative genitourinary   Musculoskeletal   Abdominal   Peds  Hematology negative hematology ROS (+)   Anesthesia Other Findings Past Medical History: No date: Abnormal gait No date: Anxiety No date: Arthritis No date: Atherosclerosis of aorta (HCC) No date: Chronic kidney disease No date: Depression No date: Diverticulosis No date: Fibromyalgia No date: GERD (gastroesophageal reflux disease) No date: History of carpal tunnel syndrome No date: History of diverticulitis No date: History of dysphagia No date: History of vertigo No date: Hx of adenomatous colonic polyps No date: Hypercholesteremia No date: Hypertension No date: Hypothyroidism No date: Lumbar radiculopathy No date: Migraine No date: Mild cognitive impairment 2014: Neuralgia No date: Obesity No date: Polymyalgia rheumatica (HCC) No date: Urinary and fecal incontinence  Past Surgical History: No date: ABDOMINAL HYSTERECTOMY +/- 10 yrs ago: BREAST CYST ASPIRATION; Right     Comment:  x 2 neg No date: CHOLECYSTECTOMY No date: COLON SURGERY 09/08/2006: COLONOSCOPY 07/16/2017:  COLONOSCOPY WITH PROPOFOL; N/A     Comment:  Procedure: COLONOSCOPY WITH PROPOFOL;  Surgeon: Scot Jun, MD;  Location: [facility] ENDOSCOPY;  Service:               Endoscopy;  Laterality: N/A; 01/24/2009: ESOPHAGOGASTRODUODENOSCOPY No date: HERNIA REPAIR No date: JOINT REPLACEMENT; Right     Comment:  knee 2015 No date: THYROIDECTOMY  BMI    Body Mass Index: 38.36 kg/m      Reproductive/Obstetrics negative OB ROS                             Anesthesia Physical Anesthesia Plan  ASA: 3  Anesthesia Plan: General   Post-op Pain Management: Minimal or no pain anticipated   Induction: Intravenous  PONV Risk Score and Plan: 3 and Propofol infusion, TIVA and Ondansetron  Airway Management Planned: Nasal Cannula  Additional Equipment: None  Intra-op Plan:   Post-operative Plan:   Informed Consent: I have reviewed the patients History and Physical, chart, labs and discussed the procedure including the risks, benefits and alternatives for the proposed anesthesia with the patient or authorized representative who has indicated his/her understanding and acceptance.     Dental advisory given  Plan Discussed with: CRNA and Surgeon  Anesthesia Plan Comments: (Discussed risks of anesthesia with patient, including possibility of difficulty with spontaneous ventilation under anesthesia necessitating airway intervention, PONV, and rare risks such as cardiac or respiratory or neurological events, and allergic reactions. Discussed the role of CRNA in patient's perioperative care. Patient understands.)       Anesthesia Quick Evaluation

## 2023-03-10 NOTE — Anesthesia Postprocedure Evaluation (Signed)
Anesthesia Post Note  Patient: Alice [name]  Procedure(s) Performed: ESOPHAGOGASTRODUODENOSCOPY (EGD) WITH PROPOFOL COLONOSCOPY WITH PROPOFOL BIOPSY POLYPECTOMY  Patient location during evaluation: Endoscopy Anesthesia Type: General Level of consciousness: awake and alert Pain management: pain level controlled Vital Signs Assessment: post-procedure vital signs reviewed and stable Respiratory status: spontaneous breathing, nonlabored ventilation, respiratory function stable and patient connected to nasal cannula oxygen Cardiovascular status: blood pressure returned to baseline and stable Postop Assessment: no apparent nausea or vomiting Anesthetic complications: no  No notable events documented.   Last Vitals:  Vitals:   03/10/23 1054 03/10/23 1104  BP: (!) 146/58 (!) 163/63  Pulse: 69 62  Resp: (!) 23   Temp:    SpO2: 100% 100%    Last Pain:  Vitals:   03/10/23 1104  TempSrc:   PainSc: 0-No pain                 Stephanie Coup

## 2023-03-10 NOTE — Interval H&P Note (Signed)
History and Physical Interval Note:  03/10/2023 10:04 AM  Alice [name]  has presented today for surgery, with the diagnosis of h/o ta polyps diarrhea gerd dysphagia.  The various methods of treatment have been discussed with the patient and family. After consideration of risks, benefits and other options for treatment, the patient has consented to  Procedure(s): ESOPHAGOGASTRODUODENOSCOPY (EGD) WITH PROPOFOL (N/A) COLONOSCOPY WITH PROPOFOL (N/A) as a surgical intervention.  The patient's history has been reviewed, patient examined, no change in status, stable for surgery.  I have reviewed the patient's chart and labs.  Questions were answered to the patient's satisfaction.     Regis Bill  Ok to proceed with EGD/Colonoscopy

## 2023-03-10 NOTE — H&P (Signed)
Outpatient short stay form Pre-procedure 03/10/2023  Alice Bill, MD  Primary Physician: [facility]  Reason for visit:  Dysphagia/Surveillance colonoscopy  History of present illness:    82 y/o lady with history of hypertension, arthritis, fibromyalgia, and hypothyroidism here for EGD for liquid dysphagia and colonoscopy for history of polyps. Last colonoscopy in 2019 was unremarkable. No blood thinners. No family history of GI malignancies. History of cholecystectomy, partial hysterectomy, and possible colon surgery for diverticulitis.    Current Facility-Administered Medications:    0.9 %  sodium chloride infusion, , Intravenous, Continuous, Loisann Roach, Rossie Muskrat, MD, Last Rate: 20 mL/hr at 03/10/23 0958, New Bag at 03/10/23 0958  Medications Prior to Admission  Medication Sig Dispense Refill Last Dose   aspirin EC 81 MG tablet Take 1 tablet (81 mg total) by mouth daily. Swallow whole.   03/09/2023   [employer] D3 5000 units capsule TAKE 5,000 UNITS BY MOUTH ONCE DAILY.  11 Past Week   estradiol (ESTRACE) 0.1 MG/GM vaginal cream Place 1 Applicatorful vaginally at bedtime.      famotidine (PEPCID) 20 MG tablet Take 20 mg by mouth 2 (two) times daily.   03/09/2023   gabapentin (NEURONTIN) 600 MG tablet Take 1 tablet by mouth 3 (three) times daily.   03/09/2023   isosorbide mononitrate (IMDUR) 60 MG 24 hr tablet Take 1 tablet (60 mg total) by mouth daily. 30 tablet 2 03/10/2023 at 0430   losartan (COZAAR) 25 MG tablet Take 25 mg by mouth daily.   03/10/2023 at 0430   metoprolol tartrate (LOPRESSOR) 25 MG tablet Take 1 tablet (25 mg total) by mouth 2 (two) times daily. (Patient taking differently: Take 12.5 mg by mouth 2 (two) times daily.) 60 tablet 0 03/10/2023 at 0430   nystatin cream (MYCOSTATIN) Apply 1 Application topically 2 (two) times daily.      PARoxetine (PAXIL) 40 MG tablet Take 40 mg by mouth daily.  99 03/09/2023   simvastatin (ZOCOR) 20 MG tablet Take 20 mg by  mouth daily at 6 PM.   03/09/2023   Vitamin E 180 MG (400 UNIT) CAPS Take by mouth.   Past Week   Misc Natural Products (GLUCOSAMINE CHONDROITIN ADV PO) Take by mouth.      triamcinolone cream (KENALOG) 0.1 % Apply 1 Application topically 2 (two) times daily. 30 g 0      Allergies  Allergen Reactions   Shrimp Extract Anaphylaxis   Meperidine Hcl Other (See Comments)   Milk (Cow) Other (See Comments)    Lactose Intolerance, headache LactoseIntolerance Lactose Intolerance, headache    Milk-Related Compounds    Peanut Oil Other (See Comments)    Headache Headache    Peanut-Containing Drug Products    Meperidine Other (See Comments) and Palpitations    RAYS COMES OUT OF EYES     Past Medical History:  Diagnosis Date   Abnormal gait    Anxiety    Arthritis    Atherosclerosis of aorta (HCC)    Chronic kidney disease    Depression    Diverticulosis    Fibromyalgia    GERD (gastroesophageal reflux disease)    History of carpal tunnel syndrome    History of diverticulitis    History of dysphagia    History of vertigo    Hx of adenomatous colonic polyps    Hypercholesteremia    Hypertension    Hypothyroidism    Lumbar radiculopathy    Migraine    Mild cognitive impairment  Neuralgia 2014   Obesity    Polymyalgia rheumatica (HCC)    Urinary and fecal incontinence     Review of systems:  Otherwise negative.    Physical Exam  Gen: Alert, oriented. Appears stated age.  HEENT: PERRLA. Lungs: No respiratory distress CV: RRR Abd: soft, benign, no masses Ext: No edema    Planned procedures: Proceed with EGD/colonoscopy. The patient understands the nature of the planned procedure, indications, risks, alternatives and potential complications including but not limited to bleeding, infection, perforation, damage to internal organs and possible oversedation/side effects from anesthesia. The patient agrees and gives consent to proceed.  Please refer to procedure notes  for findings, recommendations and patient disposition/instructions.     Alice Bill, MD [facility] Gastroenterology

## 2023-03-10 NOTE — Op Note (Signed)
[facility] Gastroenterology Patient Name: Alice [name] Procedure Date: 03/10/2023 10:05 AM MRN: [id number] Account #: [id number] Date of Birth: 08/10/1940 Admit Type: Outpatient Age: 82 Room: [facility] ENDO ROOM 3 Gender: Female Note Status: Finalized Instrument Name: Upper Endoscope [id number] Procedure:             Upper GI endoscopy Indications:           Dysphagia Providers:             Eather Colas MD, MD Referring MD:          Eather Colas MD, MD (Referring MD), [facility] [place] (Referring MD) Medicines:             Monitored Anesthesia Care Complications:         No immediate complications. Procedure:             Pre-Anesthesia Assessment:                        - Prior to the procedure, a History and Physical was                         performed, and patient medications and allergies were                         reviewed. The patient is competent. The risks and                         benefits of the procedure and the sedation options and                         risks were discussed with the patient. All questions                         were answered and informed consent was obtained.                         Patient identification and proposed procedure were                         verified by the physician, the nurse, the                         anesthesiologist, the anesthetist and the technician                         in the endoscopy suite. Mental Status Examination:                         alert and oriented. Airway Examination: normal                         oropharyngeal airway and neck mobility. Respiratory                         Examination: clear to auscultation. CV Examination:  normal. Prophylactic Antibiotics: The patient does not                         require prophylactic antibiotics. Prior                         Anticoagulants: The patient has taken no anticoagulant                          or antiplatelet agents. ASA Grade Assessment: III - A                         patient with severe systemic disease. After reviewing                         the risks and benefits, the patient was deemed in                         satisfactory condition to undergo the procedure. The                         anesthesia plan was to use monitored anesthesia care                         (MAC). Immediately prior to administration of                         medications, the patient was re-assessed for adequacy                         to receive sedatives. The heart rate, respiratory                         rate, oxygen saturations, blood pressure, adequacy of                         pulmonary ventilation, and response to care were                         monitored throughout the procedure. The physical                         status of the patient was re-assessed after the                         procedure.                        After obtaining informed consent, the endoscope was                         passed under direct vision. Throughout the procedure,                         the patient's blood pressure, pulse, and oxygen                         saturations were monitored continuously. The Endoscope  was introduced through the mouth, and advanced to the                         second part of duodenum. The upper GI endoscopy was                         accomplished without difficulty. The patient tolerated                         the procedure well. Findings:      The examined esophagus was normal.      Patchy mild inflammation characterized by erythema was found in the       stomach. Biopsies were taken with a cold forceps for Helicobacter pylori       testing. Estimated blood loss was minimal.      A large non-bleeding diverticulum was found in the second portion of the       duodenum.      The exam of the duodenum was otherwise normal. Impression:             - Normal esophagus.                        - Gastritis. Biopsied.                        - Non-bleeding duodenal diverticulum. Recommendation:        - Discharge patient to home.                        - Resume previous diet.                        - Continue present medications.                        - Await pathology results.                        - Return to referring physician as previously                         scheduled. Procedure Code(s):     --- Professional ---                        [phone], Esophagogastroduodenoscopy, flexible,                         transoral; with biopsy, single or multiple Diagnosis Code(s):     --- Professional ---                        K29.70, Gastritis, unspecified, without bleeding                        R13.10, Dysphagia, unspecified                        K57.10, Diverticulosis of small intestine without                         perforation or abscess without bleeding CPT copyright 2022 American Medical  Association. All rights reserved. The codes documented in this report are preliminary and upon coder review may  be revised to meet current compliance requirements. Eather Colas MD, MD 03/10/2023 10:51:18 AM Number of Addenda: 0 Note Initiated On: 03/10/2023 10:05 AM Estimated Blood Loss:  Estimated blood loss: none.      [facility]

## 2023-03-10 NOTE — Transfer of Care (Signed)
Immediate Anesthesia Transfer of Care Note  Patient: Alice [name]  Procedure(s) Performed: ESOPHAGOGASTRODUODENOSCOPY (EGD) WITH PROPOFOL COLONOSCOPY WITH PROPOFOL BIOPSY POLYPECTOMY  Patient Location: PACU  Anesthesia Type:General  Level of Consciousness: sedated  Airway & Oxygen Therapy: Patient Spontanous Breathing  Post-op Assessment: Report given to RN and Post -op Vital signs reviewed and stable  Post vital signs: Reviewed and stable  Last Vitals:  Vitals Value Taken Time  BP 135/57 03/10/23 1045  Temp 36.1 C 03/10/23 1044  Pulse 67 03/10/23 1046  Resp 17 03/10/23 1046  SpO2 97 % 03/10/23 1046  Vitals shown include unfiled device data.  Last Pain:  Vitals:   03/10/23 1044  TempSrc: Temporal  PainSc: Asleep         Complications: No notable events documented.

## 2023-03-11 ENCOUNTER — Encounter: Payer: Self-pay | Admitting: Gastroenterology

## 2023-03-12 LAB — SURGICAL PATHOLOGY

## 2023-08-07 ENCOUNTER — Ambulatory Visit: Payer: Self-pay | Admitting: Urology

## 2023-11-03 ENCOUNTER — Other Ambulatory Visit: Payer: Self-pay | Admitting: Family Medicine

## 2023-11-03 DIAGNOSIS — Z1231 Encounter for screening mammogram for malignant neoplasm of breast: Secondary | ICD-10-CM

## 2023-11-06 ENCOUNTER — Other Ambulatory Visit: Payer: Self-pay | Admitting: Family Medicine

## 2023-11-06 ENCOUNTER — Ambulatory Visit
Admission: RE | Admit: 2023-11-06 | Discharge: 2023-11-06 | Disposition: A | Discharge status: Home / Self Care | Source: Ambulatory Visit | Attending: Family Medicine | Admitting: Family Medicine

## 2023-11-06 DIAGNOSIS — N6311 Unspecified lump in the right breast, upper outer quadrant: Secondary | ICD-10-CM

## 2023-11-06 DIAGNOSIS — Z1231 Encounter for screening mammogram for malignant neoplasm of breast: Secondary | ICD-10-CM | POA: Insufficient documentation

## 2023-11-07 ENCOUNTER — Other Ambulatory Visit: Payer: Self-pay | Admitting: Family Medicine

## 2023-11-07 DIAGNOSIS — N6311 Unspecified lump in the right breast, upper outer quadrant: Secondary | ICD-10-CM

## 2023-11-12 ENCOUNTER — Ambulatory Visit
Admission: RE | Admit: 2023-11-12 | Discharge: 2023-11-12 | Disposition: A | Discharge status: Home / Self Care | Source: Ambulatory Visit | Attending: Family Medicine | Admitting: Family Medicine

## 2023-11-12 DIAGNOSIS — N6311 Unspecified lump in the right breast, upper outer quadrant: Secondary | ICD-10-CM | POA: Insufficient documentation
